# Patient Record
Sex: Female | Born: 1980 | Race: Black or African American | Hispanic: No | Marital: Single | Smoking: Former smoker
Health system: Southern US, Community
[De-identification: ages and names within clinical notes are randomized; demographics above are authoritative.]

## PROBLEM LIST (undated history)

## (undated) DIAGNOSIS — J45909 Unspecified asthma, uncomplicated: Secondary | ICD-10-CM

## (undated) DIAGNOSIS — I10 Essential (primary) hypertension: Secondary | ICD-10-CM

## (undated) DIAGNOSIS — E119 Type 2 diabetes mellitus without complications: Secondary | ICD-10-CM

## (undated) DIAGNOSIS — E669 Obesity, unspecified: Secondary | ICD-10-CM

## (undated) HISTORY — DX: Obesity, unspecified: E66.9

## (undated) HISTORY — DX: Essential (primary) hypertension: I10

## (undated) HISTORY — DX: Unspecified asthma, uncomplicated: J45.909

## (undated) HISTORY — DX: Type 2 diabetes mellitus without complications: E11.9

---
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
## 2000-07-07 ENCOUNTER — Other Ambulatory Visit: Admission: RE | Admit: 2000-07-07 | Discharge: 2000-07-07 | Payer: Self-pay | Admitting: Internal Medicine

## 2001-07-19 ENCOUNTER — Other Ambulatory Visit: Admission: RE | Admit: 2001-07-19 | Discharge: 2001-07-19 | Payer: Self-pay | Admitting: Internal Medicine

## 2002-09-27 ENCOUNTER — Encounter: Admission: RE | Admit: 2002-09-27 | Discharge: 2002-09-27 | Payer: Self-pay | Admitting: Obstetrics and Gynecology

## 2002-09-27 ENCOUNTER — Other Ambulatory Visit: Admission: RE | Admit: 2002-09-27 | Discharge: 2002-09-27 | Payer: Self-pay | Admitting: Obstetrics and Gynecology

## 2003-04-13 ENCOUNTER — Emergency Department (HOSPITAL_COMMUNITY): Admission: EM | Admit: 2003-04-13 | Discharge: 2003-04-13 | Payer: Self-pay

## 2004-07-24 ENCOUNTER — Ambulatory Visit (HOSPITAL_COMMUNITY): Admission: RE | Admit: 2004-07-24 | Discharge: 2004-07-24 | Payer: Self-pay | Admitting: *Deleted

## 2005-05-11 ENCOUNTER — Emergency Department (HOSPITAL_COMMUNITY): Admission: EM | Admit: 2005-05-11 | Discharge: 2005-05-11 | Payer: Self-pay | Admitting: Emergency Medicine

## 2005-06-04 ENCOUNTER — Emergency Department (HOSPITAL_COMMUNITY): Admission: EM | Admit: 2005-06-04 | Discharge: 2005-06-04 | Payer: Self-pay | Admitting: Family Medicine

## 2005-06-17 ENCOUNTER — Ambulatory Visit (HOSPITAL_COMMUNITY): Admission: RE | Admit: 2005-06-17 | Discharge: 2005-06-17 | Payer: Self-pay | Admitting: *Deleted

## 2005-08-26 ENCOUNTER — Ambulatory Visit (HOSPITAL_COMMUNITY): Admission: RE | Admit: 2005-08-26 | Discharge: 2005-08-26 | Payer: Self-pay | Admitting: *Deleted

## 2005-11-02 ENCOUNTER — Inpatient Hospital Stay (HOSPITAL_COMMUNITY): Admission: AD | Admit: 2005-11-02 | Discharge: 2005-11-02 | Payer: Self-pay | Admitting: Family Medicine

## 2005-11-02 ENCOUNTER — Ambulatory Visit: Payer: Self-pay | Admitting: Obstetrics and Gynecology

## 2005-12-13 ENCOUNTER — Inpatient Hospital Stay (HOSPITAL_COMMUNITY): Admission: AD | Admit: 2005-12-13 | Discharge: 2005-12-15 | Payer: Self-pay | Admitting: Obstetrics

## 2005-12-14 ENCOUNTER — Encounter (INDEPENDENT_AMBULATORY_CARE_PROVIDER_SITE_OTHER): Payer: Self-pay | Admitting: *Deleted

## 2007-03-21 ENCOUNTER — Emergency Department (HOSPITAL_COMMUNITY): Admission: EM | Admit: 2007-03-21 | Discharge: 2007-03-21 | Payer: Self-pay | Admitting: Family Medicine

## 2007-04-17 IMAGING — US US OB COMP LESS 14 WK
1 series · 18 of 28 positions shown · non-contrast
Comparison: none

CLINICAL DATA: Dating.  
 OBSTETRICAL ULTRASOUND <14 WKS AND TRANSVAGINAL OB US:
TECHNIQUE: Both transabdominal and transvaginal ultrasound examinations were performed for complete evaluation of the gestation as well as the maternal uterus, adnexal regions, and pelvic cul-de-sac.

[Series 1: us ob comp less 14 wks · 18 of 57 slices shown]
[im 1/57]
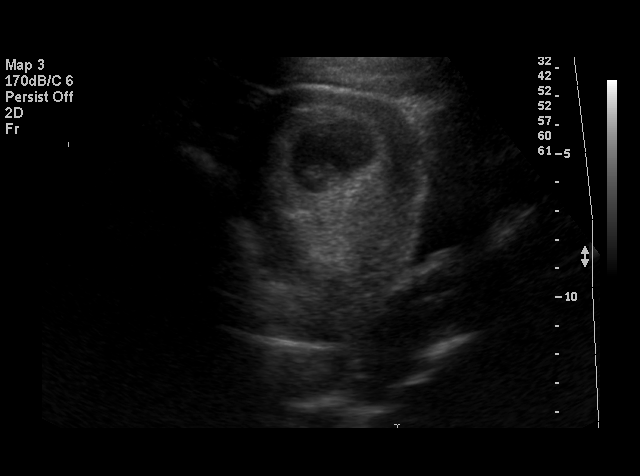
[im 5/57]
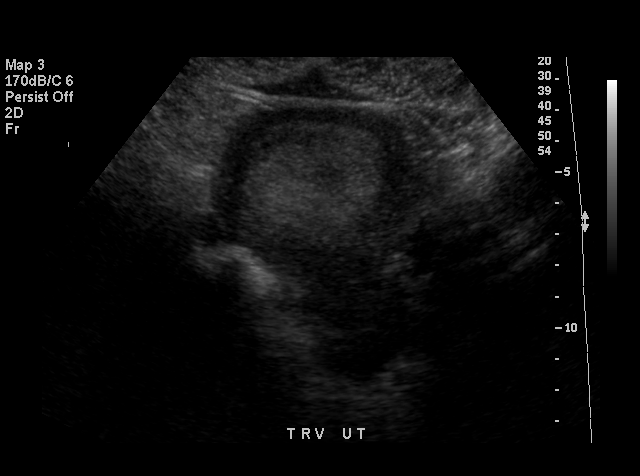
[im 7/57]
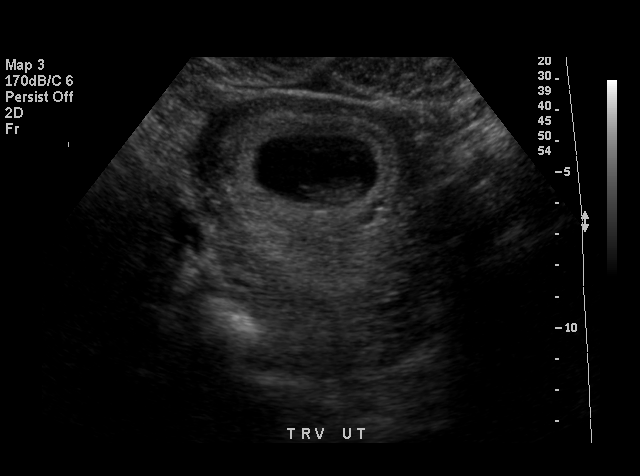
[im 11/57]
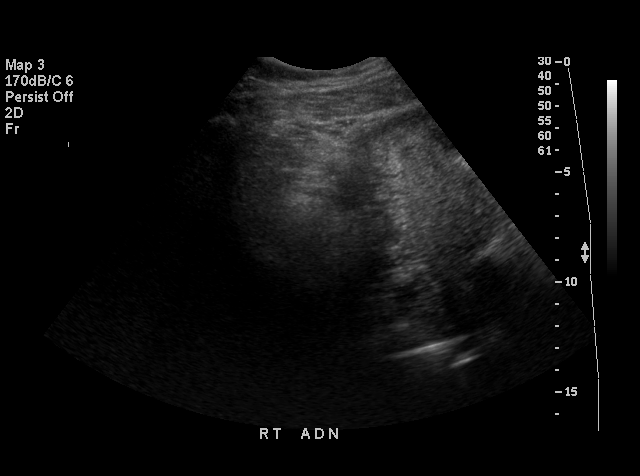
[im 15/57]
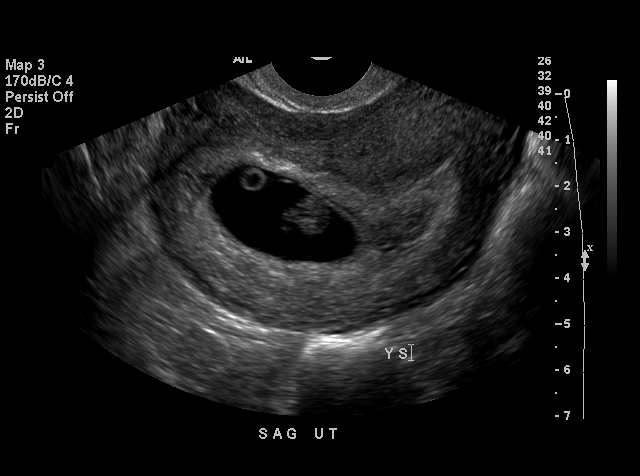
[im 17/57]
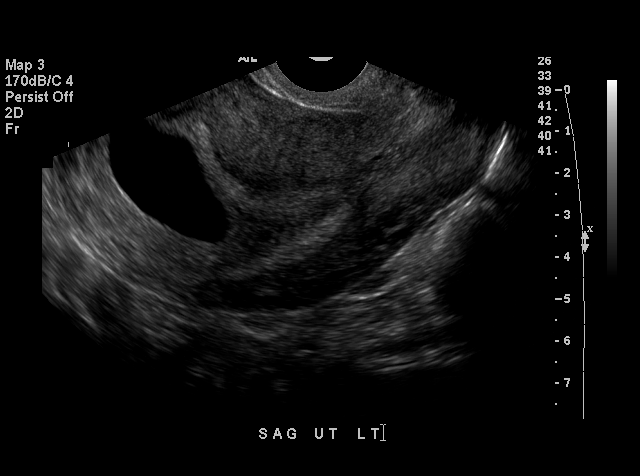
[im 21/57]
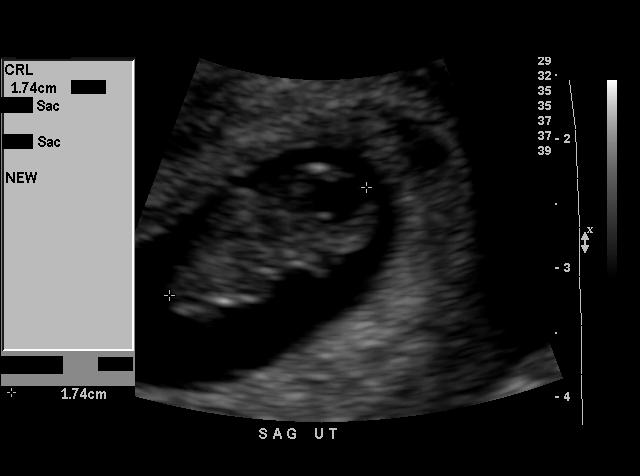
[im 23/57]
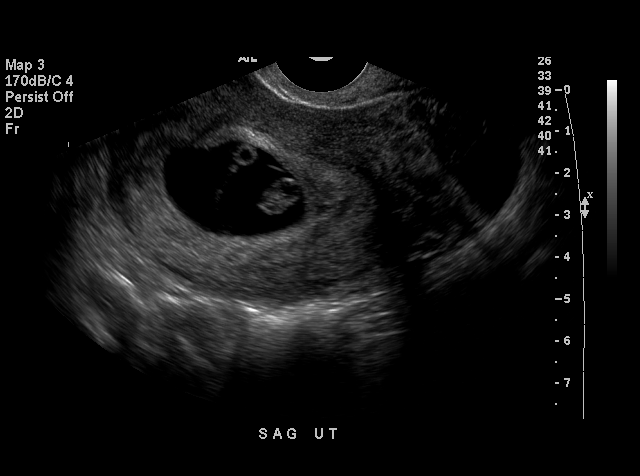
[im 27/57]
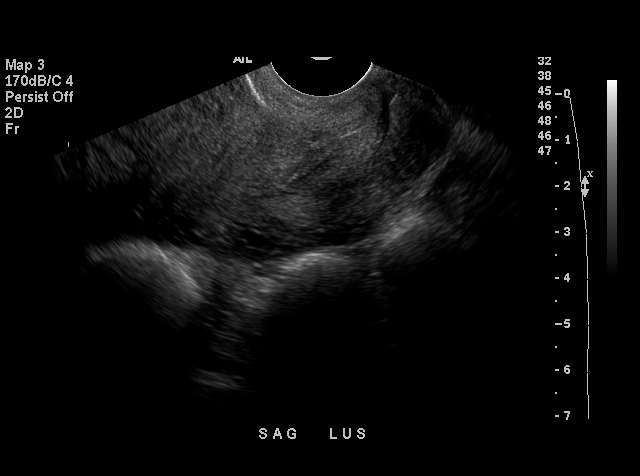
[im 30/57]
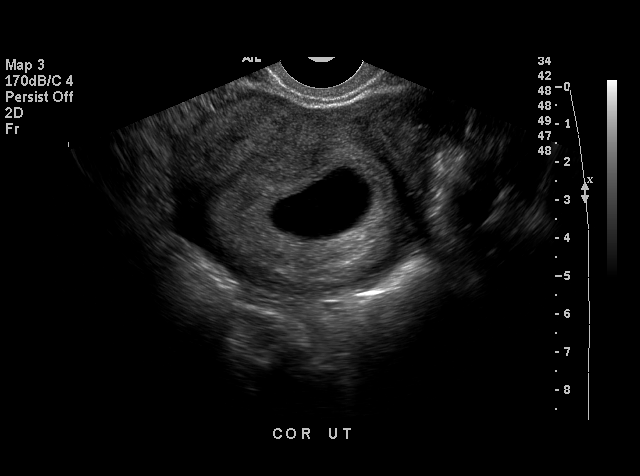
[im 34/57]
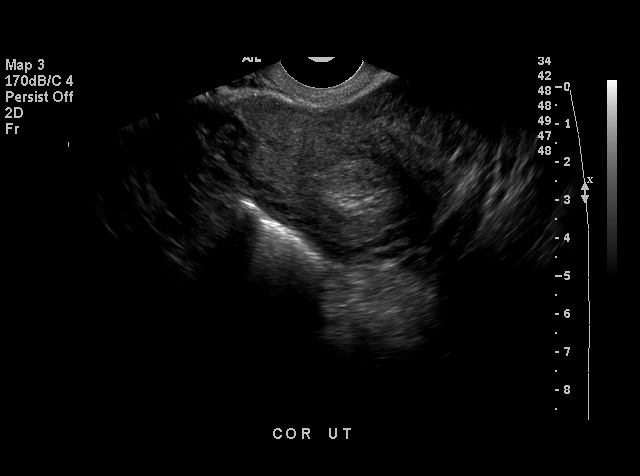
[im 36/57]
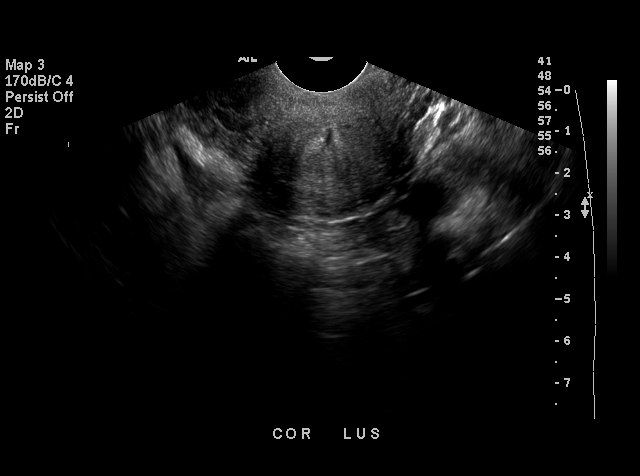
[im 40/57]
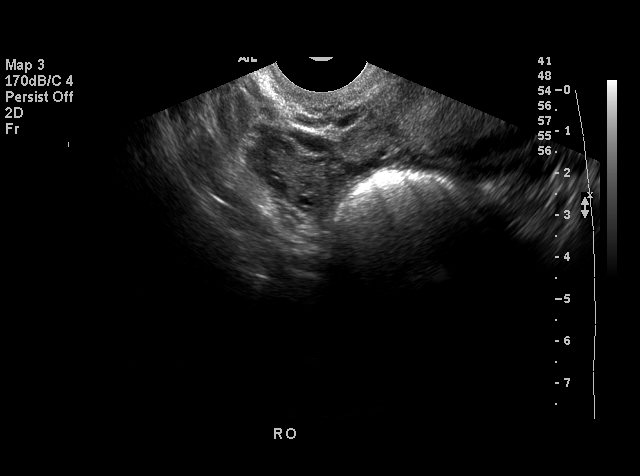
[im 44/57]
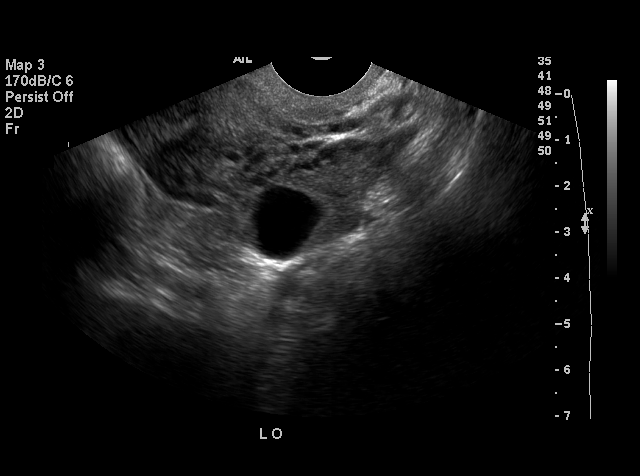
[im 46/57]
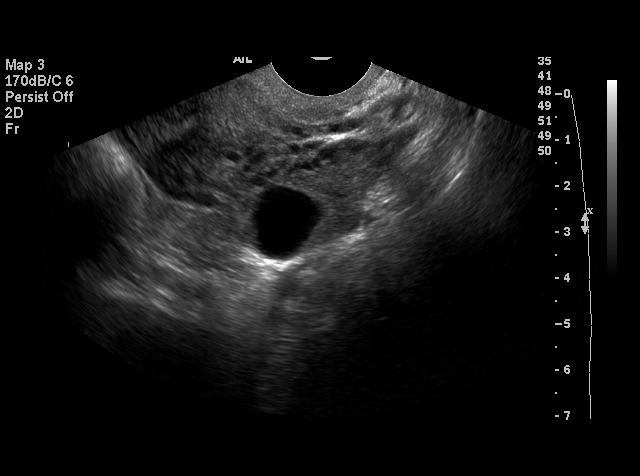
[im 50/57]
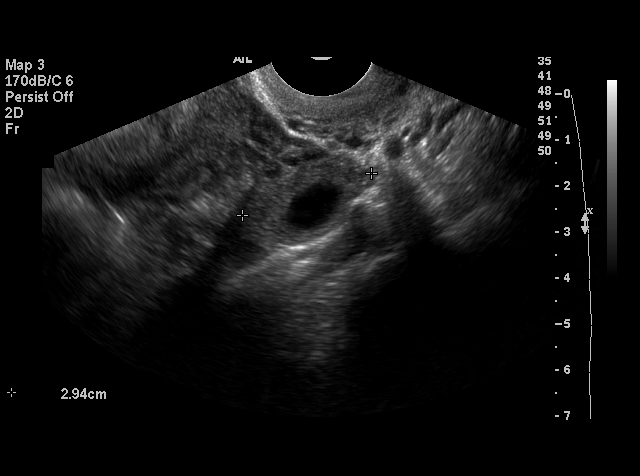
[im 52/57]
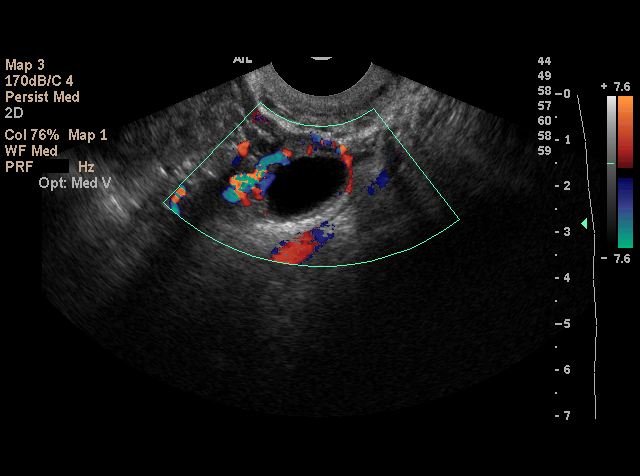
[im 57/57]
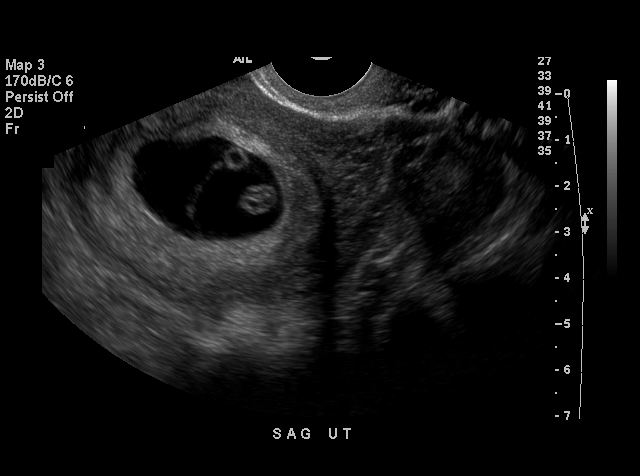

[18 of 28 positions shown; findings below may reference images not displayed]

FINDINGS: Multiple images of the uterus and adnexa were obtained using a transabdominal and endovaginal approaches.
 There is a single intrauterine pregnancy identified that demonstrates an estimated gestational age by crown rump length of 8 weeks and 2 days.  Positive regular fetal cardiac activity with a rate of 158 bpm was noted.  A normal appearing yolk sac and amnion are seen.  A very small old subchorionic hemorrhage is identified.  
 Both ovaries are seen and have a normal appearance with the right ovary measuring 3.2 x 1.3 x 2.1 cm and the left ovary measuring 3.7 x 1.5 x 2.9 cm and containing a corpus luteum cyst.  A small amount of simple free fluid is identified in the cul-de-sac.
IMPRESSION: 8 week 2 day living intrauterine pregnancy.  Normal ovaries.

## 2008-09-26 ENCOUNTER — Emergency Department (HOSPITAL_COMMUNITY): Admission: EM | Admit: 2008-09-26 | Discharge: 2008-09-26 | Payer: Self-pay | Admitting: Family Medicine

## 2009-01-04 ENCOUNTER — Emergency Department (HOSPITAL_COMMUNITY): Admission: EM | Admit: 2009-01-04 | Discharge: 2009-01-04 | Payer: Self-pay | Admitting: Emergency Medicine

## 2010-04-24 ENCOUNTER — Inpatient Hospital Stay (HOSPITAL_COMMUNITY)
Admission: AD | Admit: 2010-04-24 | Discharge: 2010-04-24 | Payer: Self-pay | Source: Home / Self Care | Attending: Obstetrics and Gynecology | Admitting: Obstetrics and Gynecology

## 2010-04-28 ENCOUNTER — Inpatient Hospital Stay (HOSPITAL_COMMUNITY)
Admission: AD | Admit: 2010-04-28 | Discharge: 2010-04-28 | Payer: Self-pay | Source: Home / Self Care | Attending: Obstetrics & Gynecology | Admitting: Obstetrics & Gynecology

## 2010-05-04 ENCOUNTER — Inpatient Hospital Stay (HOSPITAL_COMMUNITY)
Admission: AD | Admit: 2010-05-04 | Discharge: 2010-05-04 | Payer: Self-pay | Source: Home / Self Care | Attending: Obstetrics & Gynecology | Admitting: Obstetrics & Gynecology

## 2010-05-11 ENCOUNTER — Ambulatory Visit (HOSPITAL_COMMUNITY)
Admission: RE | Admit: 2010-05-11 | Discharge: 2010-05-11 | Payer: Self-pay | Source: Home / Self Care | Attending: Obstetrics & Gynecology | Admitting: Obstetrics & Gynecology

## 2010-05-18 ENCOUNTER — Inpatient Hospital Stay (HOSPITAL_COMMUNITY)
Admission: AD | Admit: 2010-05-18 | Discharge: 2010-05-18 | Payer: Self-pay | Source: Home / Self Care | Attending: Obstetrics and Gynecology | Admitting: Obstetrics and Gynecology

## 2010-05-25 ENCOUNTER — Inpatient Hospital Stay (HOSPITAL_COMMUNITY)
Admission: AD | Admit: 2010-05-25 | Discharge: 2010-05-25 | Payer: Self-pay | Source: Home / Self Care | Attending: Obstetrics and Gynecology | Admitting: Obstetrics and Gynecology

## 2010-05-25 LAB — HCG, QUANTITATIVE, PREGNANCY: hCG, Beta Chain, Quant, S: 67 m[IU]/mL — ABNORMAL HIGH (ref <5)

## 2010-05-27 LAB — HCG, QUANTITATIVE, PREGNANCY: hCG, Beta Chain, Quant, S: 10 m[IU]/mL — ABNORMAL HIGH (ref <5)

## 2010-06-01 ENCOUNTER — Ambulatory Visit (HOSPITAL_COMMUNITY)
Admission: RE | Admit: 2010-06-01 | Discharge: 2010-06-01 | Payer: Self-pay | Source: Home / Self Care | Attending: Obstetrics & Gynecology | Admitting: Obstetrics & Gynecology

## 2010-06-02 LAB — HCG, QUANTITATIVE, PREGNANCY: hCG, Beta Chain, Quant, S: 4 m[IU]/mL (ref <5)

## 2010-06-24 ENCOUNTER — Other Ambulatory Visit: Payer: Self-pay | Admitting: Obstetrics & Gynecology

## 2010-06-24 ENCOUNTER — Encounter (INDEPENDENT_AMBULATORY_CARE_PROVIDER_SITE_OTHER): Payer: Medicaid Other | Admitting: Obstetrics & Gynecology

## 2010-06-24 DIAGNOSIS — Z124 Encounter for screening for malignant neoplasm of cervix: Secondary | ICD-10-CM

## 2010-06-24 DIAGNOSIS — O00109 Unspecified tubal pregnancy without intrauterine pregnancy: Secondary | ICD-10-CM

## 2010-07-20 LAB — URINALYSIS, ROUTINE W REFLEX MICROSCOPIC
Bilirubin Urine: NEGATIVE
Glucose, UA: NEGATIVE mg/dL
Ketones, ur: 15 mg/dL — AB
Leukocytes, UA: NEGATIVE
Nitrite: NEGATIVE
Protein, ur: NEGATIVE mg/dL
Specific Gravity, Urine: 1.025 (ref 1.005–1.030)
Urobilinogen, UA: 0.2 mg/dL (ref 0.0–1.0)
pH: 5.5 (ref 5.0–8.0)

## 2010-07-20 LAB — CBC
HCT: 36.9 % (ref 36.0–46.0)
Hemoglobin: 12.9 g/dL (ref 12.0–15.0)
MCH: 27.6 pg (ref 26.0–34.0)
MCHC: 35 g/dL (ref 30.0–36.0)
MCV: 78.8 fL (ref 78.0–100.0)
Platelets: 219 10*3/uL (ref 150–400)
RBC: 4.68 MIL/uL (ref 3.87–5.11)
RDW: 13.6 % (ref 11.5–15.5)
WBC: 10 10*3/uL (ref 4.0–10.5)

## 2010-07-20 LAB — COMPREHENSIVE METABOLIC PANEL
ALT: 11 U/L (ref 0–35)
AST: 22 U/L (ref 0–37)
Albumin: 3.9 g/dL (ref 3.5–5.2)
Alkaline Phosphatase: 76 U/L (ref 39–117)
BUN: 6 mg/dL (ref 6–23)
CO2: 22 mEq/L (ref 19–32)
Calcium: 9.4 mg/dL (ref 8.4–10.5)
Chloride: 107 mEq/L (ref 96–112)
Creatinine, Ser: 0.75 mg/dL (ref 0.4–1.2)
GFR calc Af Amer: >60 mL/min (ref >60)
GFR calc non Af Amer: >60 mL/min (ref >60)
Glucose, Bld: 94 mg/dL (ref 70–99)
Potassium: 3.9 mEq/L (ref 3.5–5.1)
Sodium: 139 mEq/L (ref 135–145)
Total Bilirubin: 0.4 mg/dL (ref 0.3–1.2)
Total Protein: 6.7 g/dL (ref 6.0–8.3)

## 2010-07-20 LAB — URINE MICROSCOPIC-ADD ON

## 2010-07-20 LAB — WET PREP, GENITAL: Yeast Wet Prep HPF POC: NONE SEEN

## 2010-07-20 LAB — HCG, QUANTITATIVE, PREGNANCY
hCG, Beta Chain, Quant, S: 12438 m[IU]/mL — ABNORMAL HIGH (ref <5)
hCG, Beta Chain, Quant, S: 326 m[IU]/mL — ABNORMAL HIGH (ref <5)
hCG, Beta Chain, Quant, S: 4743 m[IU]/mL — ABNORMAL HIGH (ref <5)
hCG, Beta Chain, Quant, S: 6878 m[IU]/mL — ABNORMAL HIGH (ref <5)

## 2010-07-20 LAB — DIFFERENTIAL
Basophils Absolute: 0 10*3/uL (ref 0.0–0.1)
Basophils Relative: 0 % (ref 0–1)
Eosinophils Absolute: 0.2 10*3/uL (ref 0.0–0.7)
Eosinophils Relative: 2 % (ref 0–5)
Lymphocytes Relative: 22 % (ref 12–46)
Lymphs Abs: 2.2 10*3/uL (ref 0.7–4.0)
Monocytes Absolute: 0.6 10*3/uL (ref 0.1–1.0)
Monocytes Relative: 6 % (ref 3–12)
Neutro Abs: 7 10*3/uL (ref 1.7–7.7)
Neutrophils Relative %: 70 % (ref 43–77)

## 2010-07-20 LAB — GC/CHLAMYDIA PROBE AMP, GENITAL
Chlamydia, DNA Probe: NEGATIVE
GC Probe Amp, Genital: NEGATIVE

## 2010-07-20 LAB — ABO/RH: ABO/RH(D): B POS

## 2010-07-20 LAB — POCT PREGNANCY, URINE: Preg Test, Ur: POSITIVE

## 2010-07-31 NOTE — Progress Notes (Signed)
NAME:  Tonya Owens, Tonya Owens NO.:  [id number]  MEDICAL RECORD NO.:  [id number]           PATIENT TYPE:  A  LOCATION:  [facility]                   FACILITY:  [facility]  PHYSICIAN:  Allie Bossier, MD        DATE OF BIRTH:  01-08-1981  DATE OF SERVICE:  06/24/2010                                 CLINIC NOTE  Tonya Owens is a 30 year old single black G3, P2 1 ectopic and 1 living child (90 year old daughter).  She is a followup from an MAU visit. Please note that on January 23 she followed back up in the MAU for an ectopic that has been treated with methotrexate on April 24, 2010. Her beta on May 25, 2010, was 10 and on the 23rd it came back as 4 (less than five).  She has no complaints.  She is using condoms for birth control at this point but would be interested in a more effective/more permanent form of birth control.  She has no particular GYN complaints.  I did a Pap smear and a pelvic exam which was within normal limits.  Uterus was normal size and shape, anteverted, nontender. Adnexa nontender, no masses and her cervix appeared normal.  ASSESSMENT AND PLAN: 1. Follow up after methotrexate for ectopic - her quants are now less     than 5.  So no more follow up is needed for this. 2. No recent Pap smear.  I have checked Pap smear.  No more follow up     on that is necessary. 3. Birth control.  I have given her handout on Mirena IUD and     recommended that if she agrees with this form of birth control that     she should schedule an appointment as soon as possible to have it     placed.  In the meantime, she plans to use condoms.  Please note we did discuss all other forms of birth control but she says that Depo has caused her hair to follow out and she "faints" when she takes birth control pills.     Allie Bossier, MD    MCD/MEDQ  D:  06/24/2010  T:  06/25/2010  Job:  [id number]

## 2010-08-04 ENCOUNTER — Inpatient Hospital Stay (HOSPITAL_COMMUNITY)
Admission: AD | Admit: 2010-08-04 | Discharge: 2010-08-04 | Disposition: A | Discharge status: Home / Self Care | Payer: Medicaid Other | Source: Ambulatory Visit | Attending: Obstetrics & Gynecology | Admitting: Obstetrics & Gynecology

## 2010-08-04 DIAGNOSIS — N61 Mastitis without abscess: Secondary | ICD-10-CM | POA: Insufficient documentation

## 2010-08-04 LAB — POCT PREGNANCY, URINE: Preg Test, Ur: NEGATIVE

## 2010-08-09 ENCOUNTER — Emergency Department (HOSPITAL_COMMUNITY)
Admission: EM | Admit: 2010-08-09 | Discharge: 2010-08-09 | Disposition: A | Discharge status: Home / Self Care | Payer: No Typology Code available for payment source | Attending: Emergency Medicine | Admitting: Emergency Medicine

## 2010-08-09 ENCOUNTER — Emergency Department (HOSPITAL_COMMUNITY): Payer: No Typology Code available for payment source

## 2010-08-09 DIAGNOSIS — S5000XA Contusion of unspecified elbow, initial encounter: Secondary | ICD-10-CM | POA: Insufficient documentation

## 2010-08-09 DIAGNOSIS — D573 Sickle-cell trait: Secondary | ICD-10-CM | POA: Insufficient documentation

## 2010-08-09 DIAGNOSIS — Y92410 Unspecified street and highway as the place of occurrence of the external cause: Secondary | ICD-10-CM | POA: Insufficient documentation

## 2010-08-09 DIAGNOSIS — T148XXA Other injury of unspecified body region, initial encounter: Secondary | ICD-10-CM | POA: Insufficient documentation

## 2010-08-09 DIAGNOSIS — S139XXA Sprain of joints and ligaments of unspecified parts of neck, initial encounter: Secondary | ICD-10-CM | POA: Insufficient documentation

## 2010-08-09 DIAGNOSIS — S60219A Contusion of unspecified wrist, initial encounter: Secondary | ICD-10-CM | POA: Insufficient documentation

## 2010-08-11 ENCOUNTER — Emergency Department (HOSPITAL_COMMUNITY)
Admission: EM | Admit: 2010-08-11 | Discharge: 2010-08-11 | Disposition: A | Discharge status: Home / Self Care | Payer: No Typology Code available for payment source | Attending: Emergency Medicine | Admitting: Emergency Medicine

## 2010-08-11 DIAGNOSIS — D573 Sickle-cell trait: Secondary | ICD-10-CM | POA: Insufficient documentation

## 2010-08-11 DIAGNOSIS — M549 Dorsalgia, unspecified: Secondary | ICD-10-CM | POA: Insufficient documentation

## 2010-08-18 LAB — POCT URINALYSIS DIP (DEVICE)
Glucose, UA: NEGATIVE mg/dL
Hgb urine dipstick: NEGATIVE
Specific Gravity, Urine: 1.02 (ref 1.005–1.030)
pH: 6.5 (ref 5.0–8.0)

## 2011-01-04 ENCOUNTER — Inpatient Hospital Stay (INDEPENDENT_AMBULATORY_CARE_PROVIDER_SITE_OTHER)
Admission: RE | Admit: 2011-01-04 | Discharge: 2011-01-04 | Disposition: A | Discharge status: Home / Self Care | Payer: Self-pay | Source: Ambulatory Visit | Attending: Family Medicine | Admitting: Family Medicine

## 2011-01-04 DIAGNOSIS — S335XXA Sprain of ligaments of lumbar spine, initial encounter: Secondary | ICD-10-CM

## 2012-01-26 ENCOUNTER — Encounter (HOSPITAL_COMMUNITY): Payer: Self-pay

## 2012-01-26 ENCOUNTER — Emergency Department (HOSPITAL_COMMUNITY)
Admission: EM | Admit: 2012-01-26 | Discharge: 2012-01-26 | Disposition: A | Discharge status: Home / Self Care | Payer: Medicaid Other | Source: Home / Self Care | Attending: Emergency Medicine | Admitting: Emergency Medicine

## 2012-01-26 DIAGNOSIS — J320 Chronic maxillary sinusitis: Secondary | ICD-10-CM

## 2012-01-26 MED ORDER — ACETAMINOPHEN-CODEINE #3 300-30 MG PO TABS: 1.0000 | ORAL_TABLET | Freq: Four times a day (QID) | ORAL | Status: DC | PRN

## 2012-01-26 MED ORDER — FEXOFENADINE-PSEUDOEPHED ER 60-120 MG PO TB12: 1.0000 | ORAL_TABLET | Freq: Two times a day (BID) | ORAL | Status: DC

## 2012-01-26 NOTE — ED Notes (Signed)
C/o sinus problems, x 2 days. Facial pain and pressure, eye pain.

## 2012-01-26 NOTE — ED Provider Notes (Signed)
History     CSN: [id number]  Arrival date & time 01/26/12  1901   First MD Initiated Contact with Patient 01/26/12 1917      Chief Complaint  Patient presents with  . Facial Pain    (Consider location/radiation/quality/duration/timing/severity/associated sxs/prior treatment) HPI Comments: Patient presents to urgent care complaining of pressure and discomfort, (points to maxillary region bilaterally.), Also describes it intermittently have felt some discomfort or pain behind her eyes. But that has faded away. Denies any postnasal dripping, cough fevers sore throat or any other symptoms.   The history is provided by the patient.    History reviewed. No pertinent past medical history.  History reviewed. No pertinent past surgical history.  No family history on file.  History  Substance Use Topics  . Smoking status: Current Every Day Smoker  . Smokeless tobacco: Not on file  . Alcohol Use: Yes    OB History    Grav Para Term Preterm Abortions TAB SAB Ect Mult Living                  Review of Systems  Constitutional: Negative for fever, chills, activity change, appetite change and fatigue.  HENT: Positive for sinus pressure. Negative for hearing loss, congestion, sore throat, facial swelling, rhinorrhea, drooling, mouth sores, trouble swallowing, neck pain, neck stiffness, dental problem, voice change and postnasal drip.   Eyes: Positive for pain. Negative for photophobia, redness and visual disturbance.  Musculoskeletal: Negative for back pain.  Skin: Negative for rash and wound.  Neurological: Negative for dizziness, seizures, speech difficulty, light-headedness, numbness and headaches.    Allergies  Review of patient's allergies indicates no known allergies.  Home Medications   Current Outpatient Rx  Name Route Sig Dispense Refill  . ACETAMINOPHEN-CODEINE #3 300-30 MG PO TABS Oral Take 1-2 tablets by mouth every 6 (six) hours as needed for pain. 15 tablet 0  .  FEXOFENADINE-PSEUDOEPHED ER 60-120 MG PO TB12 Oral Take 1 tablet by mouth every 12 (twelve) hours. 30 tablet 0    BP 137/82  Pulse 91  Temp 98.4 F (36.9 C) (Oral)  Resp 18  SpO2 100%  LMP 01/21/2012  Physical Exam  Nursing note and vitals reviewed. Constitutional: Vital signs are normal. She appears well-developed and well-nourished.  Non-toxic appearance. She does not have a sickly appearance. She does not appear ill. No distress.  HENT:  Head: Normocephalic.  Eyes: Conjunctivae normal are normal.  Cardiovascular: Normal rate.  Exam reveals no gallop.   No murmur heard. Pulmonary/Chest: Effort normal and breath sounds normal. No respiratory distress. She has no wheezes. She has no rales. She exhibits no tenderness.  Skin: No rash noted. No erythema.    ED Course  Procedures (including critical care time)  Labs Reviewed - No data to display No results found.   1. Maxillary sinusitis       MDM  Patient with facial pain, reporting in her maxillary region. Afebrile, with an unremarkable exam including a normal neurological exam. Patient has been prescribed Allegra-D encouraged to hydrate yourself well and use humidifiers at night as much as possible. Have encouraged her to return if any further symptoms or worsening symptoms.        Jimmie Molly, MD 01/26/12 2049

## 2012-01-28 ENCOUNTER — Other Ambulatory Visit (HOSPITAL_COMMUNITY)
Admission: RE | Admit: 2012-01-28 | Discharge: 2012-01-28 | Disposition: A | Discharge status: Home / Self Care | Payer: Medicaid Other | Source: Ambulatory Visit | Attending: Obstetrics and Gynecology | Admitting: Obstetrics and Gynecology

## 2012-01-28 ENCOUNTER — Ambulatory Visit (INDEPENDENT_AMBULATORY_CARE_PROVIDER_SITE_OTHER): Payer: Medicaid Other | Admitting: Obstetrics and Gynecology

## 2012-01-28 ENCOUNTER — Encounter: Payer: Self-pay | Admitting: Obstetrics and Gynecology

## 2012-01-28 VITALS — BP 121/80 | HR 97 | Temp 97.5°F | Resp 12 | Ht 65.0 in | Wt 211.7 lb

## 2012-01-28 DIAGNOSIS — Z87891 Personal history of nicotine dependence: Secondary | ICD-10-CM | POA: Insufficient documentation

## 2012-01-28 DIAGNOSIS — Z3042 Encounter for surveillance of injectable contraceptive: Secondary | ICD-10-CM

## 2012-01-28 DIAGNOSIS — Z720 Tobacco use: Secondary | ICD-10-CM

## 2012-01-28 DIAGNOSIS — Z01419 Encounter for gynecological examination (general) (routine) without abnormal findings: Secondary | ICD-10-CM

## 2012-01-28 DIAGNOSIS — Z3049 Encounter for surveillance of other contraceptives: Secondary | ICD-10-CM

## 2012-01-28 DIAGNOSIS — F17200 Nicotine dependence, unspecified, uncomplicated: Secondary | ICD-10-CM

## 2012-01-28 DIAGNOSIS — Z113 Encounter for screening for infections with a predominantly sexual mode of transmission: Secondary | ICD-10-CM | POA: Insufficient documentation

## 2012-01-28 DIAGNOSIS — E669 Obesity, unspecified: Secondary | ICD-10-CM

## 2012-01-28 DIAGNOSIS — Z1151 Encounter for screening for human papillomavirus (HPV): Secondary | ICD-10-CM | POA: Insufficient documentation

## 2012-01-28 DIAGNOSIS — Z0000 Encounter for general adult medical examination without abnormal findings: Secondary | ICD-10-CM

## 2012-01-28 LAB — POCT PREGNANCY, URINE: Preg Test, Ur: NEGATIVE

## 2012-01-28 MED ORDER — MEDROXYPROGESTERONE ACETATE 150 MG/ML IM SUSP: 150.0000 mg | INTRAMUSCULAR | Status: DC

## 2012-01-28 MED ORDER — MEDROXYPROGESTERONE ACETATE 150 MG/ML IM SUSP
150.0000 mg | INTRAMUSCULAR | Status: DC
  Administered 2012-01-28: 150 mg via INTRAMUSCULAR

## 2012-01-28 NOTE — Progress Notes (Signed)
Chief Complaint: Gynecologic Exam   None    SUBJECTIVE HPI: Tonya Owens is a 31 y.o. G3 P2011 who presents today for Pap smear, malodorous white non-pruritic vaginal discharge and wanting to start on Depo-Provera. She states she did have an abnormal Pap some time in the past where they "froze cells" of her cervix. Her last Pap February 2012 was normal. She's not currently sexually active but requests GC chlamydia testing with Pap. Menses are normal and she has no abnormal bleeding. LMP 9 /13/ 2013.  History reviewed. No pertinent past medical history. OB History    Grav Para Term Preterm Abortions TAB SAB Ect Mult Living                Hx FT NSVD, term FD, ectopic  History reviewed. No pertinent past surgical history. History   Social History  . Marital Status: Single    Spouse Name: N/A    Number of Children: N/A  . Years of Education: N/A   Occupational History  . Not on file.   Social History Main Topics  . Smoking status: Current Every Day Smoker -- 0.5 packs/day    Types: Cigarettes  . Smokeless tobacco: Never Used  . Alcohol Use: No  . Drug Use: No  . Sexually Active: Not Currently    Birth Control/ Protection: None   Other Topics Concern  . Not on file   Social History Narrative  . No narrative on file   Current Outpatient Prescriptions on File Prior to Visit  Medication Sig Dispense Refill  . fexofenadine-pseudoephedrine (ALLEGRA-D) 60-120 MG per tablet Take 1 tablet by mouth every 12 (twelve) hours.  30 tablet  0  . acetaminophen-codeine (TYLENOL #3) 300-30 MG per tablet Take 1-2 tablets by mouth every 6 (six) hours as needed for pain.  15 tablet  0   No Known Allergies  ROS: Pertinent items in HPI  OBJECTIVE Blood pressure 121/80, pulse 97, temperature 97.5 F (36.4 C), temperature source Oral, resp. rate 12, height 5\' 5"  (1.651 m), weight 211 lb 11.2 oz (96.026 kg), last menstrual period 01/21/2012. GENERAL: Well-developed, well-nourished female in  no acute distress.  HEENT: Normocephalic HEART: normal rate RESP: normal effort BREASTS: large pendulous soft, symmetric, no dominant mass. No nipple discharge or retraction. No lymphadenopathy ABDOMEN: Soft, non-tender, obese EXTREMITIES: Nontender, no edema NEURO: Alert and oriented SPECULUM EXAM: NEFG, large amount creamy white discharge, no blood noted, cervix clean BIMANUAL: cervix posterior, mobile; uterus unable to outline well, no adnexal tenderness or masses   ASSESSMENT 1. Obesity   2. Tobacco use   Premenopausal GYN exam Vaginal discharge  PLAN WP, Pap, GC, CT, UPT Depo-Provera 150 mg IM q 3 months Refer to PCP Discussed weight loss, diet, exercise, smoking cessation    Danae Orleans, CNM 01/28/2012  11:33 AM

## 2012-01-28 NOTE — Patient Instructions (Signed)
Exercise to Lose Weight Exercise and a healthy diet may help you lose weight. Your doctor may suggest specific exercises. EXERCISE IDEAS AND TIPS  Choose low-cost things you enjoy doing, such as walking, bicycling, or exercising to workout videos.   Take stairs instead of the elevator.   Walk during your lunch break.   Park your car further away from work or school.   Go to a gym or an exercise class.   Start with 5 to 10 minutes of exercise each day. Build up to 30 minutes of exercise 4 to 6 days a week.   Wear shoes with good support and comfortable clothes.   Stretch before and after working out.   Work out until you breathe harder and your heart beats faster.   Drink extra water when you exercise.   Do not do so much that you hurt yourself, feel dizzy, or get very short of breath.  Exercises that burn about 150 calories:  Running 1  miles in 15 minutes.   Playing volleyball for 45 to 60 minutes.   Washing and waxing a car for 45 to 60 minutes.   Playing touch football for 45 minutes.   Walking 1  miles in 35 minutes.   Pushing a stroller 1  miles in 30 minutes.   Playing basketball for 30 minutes.   Raking leaves for 30 minutes.   Bicycling 5 miles in 30 minutes.   Walking 2 miles in 30 minutes.   Dancing for 30 minutes.   Shoveling snow for 15 minutes.   Swimming laps for 20 minutes.   Walking up stairs for 15 minutes.   Bicycling 4 miles in 15 minutes.   Gardening for 30 to 45 minutes.   Jumping rope for 15 minutes.   Washing windows or floors for 45 to 60 minutes.  Document Released: 05/29/2010 Document Revised: 04/15/2011 Document Reviewed: 05/29/2010 [facility] Patient Information 2012 [place], [place].Smoking Cessation This document explains the best ways for you to quit smoking and new treatments to help. It lists new medicines that can double or triple your chances of quitting and quitting for good. It also considers ways to avoid relapses  and concerns you may have about quitting, including weight gain. NICOTINE: A POWERFUL ADDICTION If you have tried to quit smoking, you know how hard it can be. It is hard because nicotine is a very addictive drug. For some people, it can be as addictive as heroin or cocaine. Usually, people make 2 or 3 tries, or more, before finally being able to quit. Each time you try to quit, you can learn about what helps and what hurts. Quitting takes hard work and a lot of effort, but you can quit smoking. QUITTING SMOKING IS ONE OF THE MOST IMPORTANT THINGS YOU WILL EVER DO.  You will live longer, feel better, and live better.   The impact on your body of quitting smoking is felt almost immediately:   Within 20 minutes, blood pressure decreases. Pulse returns to its normal level.   After 8 hours, carbon monoxide levels in the blood return to normal. Oxygen level increases.   After 24 hours, chance of heart attack starts to decrease. Breath, hair, and body stop smelling like smoke.   After 48 hours, damaged nerve endings begin to recover. Sense of taste and smell improve.   After 72 hours, the body is virtually free of nicotine. Bronchial tubes relax and breathing becomes easier.   After 2 to 12 weeks, lungs can  hold more air. Exercise becomes easier and circulation improves.   Quitting will reduce your risk of having a heart attack, stroke, cancer, or lung disease:   After 1 year, the risk of coronary heart disease is cut in half.   After 5 years, the risk of stroke falls to the same as a nonsmoker.   After 10 years, the risk of lung cancer is cut in half and the risk of other cancers decreases significantly.   After 15 years, the risk of coronary heart disease drops, usually to the level of a nonsmoker.   If you are pregnant, quitting smoking will improve your chances of having a healthy baby.   The people you live with, especially your children, will be healthier.   You will have extra  money to spend on things other than cigarettes.  FIVE KEYS TO QUITTING Studies have shown that these 5 steps will help you quit smoking and quit for good. You have the best chances of quitting if you use them together: 1. Get ready.  2. Get support and encouragement.  3. Learn new skills and behaviors.  4. Get medicine to reduce your nicotine addiction and use it correctly.  5. Be prepared for relapse or difficult situations. Be determined to continue trying to quit, even if you do not succeed at first.  1. GET READY  Set a quit date.   Change your environment.   Get rid of ALL cigarettes, ashtrays, matches, and lighters in your home, car, and place of work.   Do not let people smoke in your home.   Review your past attempts to quit. Think about what worked and what did not.   Once you quit, do not smoke. NOT EVEN A PUFF!  2. GET SUPPORT AND ENCOURAGEMENT Studies have shown that you have a better chance of being successful if you have help. You can get support in many ways.  Tell your family, friends, and coworkers that you are going to quit and need their support. Ask them not to smoke around you.   Talk to your caregivers (doctor, dentist, nurse, pharmacist, psychologist, and/or smoking counselor).   Get individual, group, or telephone counseling and support. The more counseling you have, the better your chances are of quitting. Programs are available at [employer] and health centers. Call your local health department for information about programs in your area.   Spiritual beliefs and practices may help some smokers quit.   Quit meters are Photographer that keep track of quit statistics, such as amount of "quit-time," cigarettes not smoked, and money saved.   Many smokers find one or more of the many self-help books available useful in helping them quit and stay off tobacco.  3. LEARN NEW SKILLS AND BEHAVIORS  Try to distract yourself  from urges to smoke. Talk to someone, go for a walk, or occupy your time with a task.   When you first try to quit, change your routine. Take a different route to work. Drink tea instead of coffee. Eat breakfast in a different place.   Do something to reduce your stress. Take a hot bath, exercise, or read a book.   Plan something enjoyable to do every day. Reward yourself for not smoking.   Explore interactive web-based programs that specialize in helping you quit.  4. GET MEDICINE AND USE IT CORRECTLY Medicines can help you stop smoking and decrease the urge to smoke. Combining medicine with the above behavioral  methods and support can quadruple your chances of successfully quitting smoking. The U.[place]. Food and Drug Administration (FDA) has approved 7 medicines to help you quit smoking. These medicines fall into 3 categories.  Nicotine replacement therapy (delivers nicotine to your body without the negative effects and risks of smoking):   Nicotine gum: Available over-the-counter.   Nicotine lozenges: Available over-the-counter.   Nicotine inhaler: Available by prescription.   Nicotine nasal spray: Available by prescription.   Nicotine skin patches (transdermal): Available by prescription and over-the-counter.   Antidepressant medicine (helps people abstain from smoking, but how this works is unknown):   Bupropion sustained-release (SR) tablets: Available by prescription.   Nicotinic receptor partial agonist (simulates the effect of nicotine in your brain):   Varenicline tartrate tablets: Available by prescription.   Ask your caregiver for advice about which medicines to use and how to use them. Carefully read the information on the package.   Everyone who is trying to quit may benefit from using a medicine. If you are pregnant or trying to become pregnant, nursing an infant, you are under age 80, or you smoke fewer than 10 cigarettes per day, talk to your caregiver before taking  any nicotine replacement medicines.   You should stop using a nicotine replacement product and call your caregiver if you experience nausea, dizziness, weakness, vomiting, fast or irregular heartbeat, mouth problems with the lozenge or gum, or redness or swelling of the skin around the patch that does not go away.   Do not use any other product containing nicotine while using a nicotine replacement product.   Talk to your caregiver before using these products if you have diabetes, heart disease, asthma, stomach ulcers, you had a recent heart attack, you have high blood pressure that is not controlled with medicine, a history of irregular heartbeat, or you have been prescribed medicine to help you quit smoking.  5. BE PREPARED FOR RELAPSE OR DIFFICULT SITUATIONS  Most relapses occur within the first 3 months after quitting. Do not be discouraged if you start smoking again. Remember, most people try several times before they finally quit.   You may have symptoms of withdrawal because your body is used to nicotine. You may crave cigarettes, be irritable, feel very hungry, cough often, get headaches, or have difficulty concentrating.   The withdrawal symptoms are only temporary. They are strongest when you first quit, but they will go away within 10 to 14 days.  Here are some difficult situations to watch for:  Alcohol. Avoid drinking alcohol. Drinking lowers your chances of successfully quitting.   Caffeine. Try to reduce the amount of caffeine you consume. It also lowers your chances of successfully quitting.   Other smokers. Being around smoking can make you want to smoke. Avoid smokers.   Weight gain. Many smokers will gain weight when they quit, usually less than 10 pounds. Eat a healthy diet and stay active. Do not let weight gain distract you from your main goal, quitting smoking. Some medicines that help you quit smoking may also help delay weight gain. You can always lose the weight gained  after you quit.   Bad mood or depression. There are a lot of ways to improve your mood other than smoking.  If you are having problems with any of these situations, talk to your caregiver. SPECIAL SITUATIONS AND CONDITIONS Studies suggest that everyone can quit smoking. Your situation or condition can give you a special reason to quit.  Pregnant women/new mothers: By quitting,  you protect your baby's health and your own.   Hospitalized patients: By quitting, you reduce health problems and help healing.   Heart attack patients: By quitting, you reduce your risk of a second heart attack.   Lung, head, and neck cancer patients: By quitting, you reduce your chance of a second cancer.   Parents of children and adolescents: By quitting, you protect your children from illnesses caused by secondhand smoke.  QUESTIONS TO THINK ABOUT Think about the following questions before you try to stop smoking. You may want to talk about your answers with your caregiver.  Why do you want to quit?   If you tried to quit in the past, what helped and what did not?   What will be the most difficult situations for you after you quit? How will you plan to handle them?   Who can help you through the tough times? Your family? Friends? Caregiver?   What pleasures do you get from smoking? What ways can you still get pleasure if you quit?  Here are some questions to ask your caregiver:  How can you help me to be successful at quitting?   What medicine do you think would be best for me and how should I take it?   What should I do if I need more help?   What is smoking withdrawal like? How can I get information on withdrawal?  Quitting takes hard work and a lot of effort, but you can quit smoking. FOR MORE INFORMATION  Smokefree.gov (http://www.davis-sullivan.com/) provides free, accurate, evidence-based information and professional assistance to help support the immediate and long-term needs of people trying to quit  smoking. Document Released: 04/20/2001 Document Revised: 04/15/2011 Document Reviewed: 02/10/2009 [facility] Patient Information 2012 [place], [place].

## 2012-01-29 LAB — WET PREP, GENITAL
Clue Cells Wet Prep HPF POC: NONE SEEN
Trich, Wet Prep: NONE SEEN

## 2012-02-07 ENCOUNTER — Telehealth: Payer: Self-pay | Admitting: Obstetrics and Gynecology

## 2012-02-07 NOTE — Telephone Encounter (Signed)
Patient called for test results. Pls call back.

## 2012-02-08 NOTE — Telephone Encounter (Signed)
LM for patient to return call to clinic.

## 2012-02-08 NOTE — Telephone Encounter (Signed)
Patient called back and was informed of normal test results. Patient voiced understanding and did not have any further questions at this time.

## 2012-02-08 NOTE — Telephone Encounter (Signed)
Chart reviewed. Results from 01/28/12 all normal (pap smear, GC/CT, HPV and wet prep)

## 2012-02-09 ENCOUNTER — Telehealth: Payer: Self-pay | Admitting: Obstetrics and Gynecology

## 2012-02-09 NOTE — Telephone Encounter (Signed)
Patient called to follow-up on referral to PCP. Called family practice and stated that they tried to get a hold of this patient but couldn't so they just mailed her information. Asked if they could call patient now since patient has been calling us about the referral; family practice states they will call patient now and make an appointment.

## 2012-02-14 ENCOUNTER — Encounter: Payer: Self-pay | Admitting: Family Medicine

## 2012-02-14 ENCOUNTER — Ambulatory Visit (INDEPENDENT_AMBULATORY_CARE_PROVIDER_SITE_OTHER): Payer: Medicaid Other | Admitting: Family Medicine

## 2012-02-14 VITALS — BP 138/85 | HR 99 | Temp 98.5°F | Ht 65.5 in | Wt 212.0 lb

## 2012-02-14 DIAGNOSIS — E669 Obesity, unspecified: Secondary | ICD-10-CM

## 2012-02-14 DIAGNOSIS — Z23 Encounter for immunization: Secondary | ICD-10-CM

## 2012-02-14 NOTE — Patient Instructions (Signed)
Smoking Cessation Quitting smoking is important to your health and has many advantages. However, it is not always easy to quit since nicotine is a very addictive drug. Often times, people try 3 times or more before being able to quit. This document explains the best ways for you to prepare to quit smoking. Quitting takes hard work and a lot of effort, but you can do it. ADVANTAGES OF QUITTING SMOKING  You will live longer, feel better, and live better.  Your body will feel the impact of quitting smoking almost immediately.  Within 20 minutes, blood pressure decreases. Your pulse returns to its normal level.  After 8 hours, carbon monoxide levels in the blood return to normal. Your oxygen level increases.  After 24 hours, the chance of having a heart attack starts to decrease. Your breath, hair, and body stop smelling like smoke.  After 48 hours, damaged nerve endings begin to recover. Your sense of taste and smell improve.  After 72 hours, the body is virtually free of nicotine. Your bronchial tubes relax and breathing becomes easier.  After 2 to 12 weeks, lungs can hold more air. Exercise becomes easier and circulation improves.  The risk of having a heart attack, stroke, cancer, or lung disease is greatly reduced.  After 1 year, the risk of coronary heart disease is cut in half.  After 5 years, the risk of stroke falls to the same as a nonsmoker.  After 10 years, the risk of lung cancer is cut in half and the risk of other cancers decreases significantly.  After 15 years, the risk of coronary heart disease drops, usually to the level of a nonsmoker.  If you are pregnant, quitting smoking will improve your chances of having a healthy baby.  The people you live with, especially any children, will be healthier.  You will have extra money to spend on things other than cigarettes. QUESTIONS TO THINK ABOUT BEFORE ATTEMPTING TO QUIT You may want to talk about your answers with your  caregiver.  Why do you want to quit?  If you tried to quit in the past, what helped and what did not?  What will be the most difficult situations for you after you quit? How will you plan to handle them?  Who can help you through the tough times? Your family? Friends? A caregiver?  What pleasures do you get from smoking? What ways can you still get pleasure if you quit? Here are some questions to ask your caregiver:  How can you help me to be successful at quitting?  What medicine do you think would be best for me and how should I take it?  What should I do if I need more help?  What is smoking withdrawal like? How can I get information on withdrawal? GET READY  Set a quit date.  Change your environment by getting rid of all cigarettes, ashtrays, matches, and lighters in your home, car, or work. Do not let people smoke in your home.  Review your past attempts to quit. Think about what worked and what did not. GET SUPPORT AND ENCOURAGEMENT You have a better chance of being successful if you have help. You can get support in many ways.  Tell your family, friends, and co-workers that you are going to quit and need their support. Ask them not to smoke around you.  Get individual, group, or telephone counseling and support. Programs are available at local hospitals and health centers. Call your local health department for   information about programs in your area.  Spiritual beliefs and practices may help some smokers quit.  Download a "quit meter" on your computer to keep track of quit statistics, such as how long you have gone without smoking, cigarettes not smoked, and money saved.  Get a self-help book about quitting smoking and staying off of tobacco. LEARN NEW SKILLS AND BEHAVIORS  Distract yourself from urges to smoke. Talk to someone, go for a walk, or occupy your time with a task.  Change your normal routine. Take a different route to work. Drink tea instead of coffee.  Eat breakfast in a different place.  Reduce your stress. Take a hot bath, exercise, or read a book.  Plan something enjoyable to do every day. Reward yourself for not smoking.  Explore interactive web-based programs that specialize in helping you quit. GET MEDICINE AND USE IT CORRECTLY Medicines can help you stop smoking and decrease the urge to smoke. Combining medicine with the above behavioral methods and support can greatly increase your chances of successfully quitting smoking.  Nicotine replacement therapy helps deliver nicotine to your body without the negative effects and risks of smoking. Nicotine replacement therapy includes nicotine gum, lozenges, inhalers, nasal sprays, and skin patches. Some may be available over-the-counter and others require a prescription.  Antidepressant medicine helps people abstain from smoking, but how this works is unknown. This medicine is available by prescription.  Nicotinic receptor partial agonist medicine simulates the effect of nicotine in your brain. This medicine is available by prescription. Ask your caregiver for advice about which medicines to use and how to use them based on your health history. Your caregiver will tell you what side effects to look out for if you choose to be on a medicine or therapy. Carefully read the information on the package. Do not use any other product containing nicotine while using a nicotine replacement product.  RELAPSE OR DIFFICULT SITUATIONS Most relapses occur within the first 3 months after quitting. Do not be discouraged if you start smoking again. Remember, most people try several times before finally quitting. You may have symptoms of withdrawal because your body is used to nicotine. You may crave cigarettes, be irritable, feel very hungry, cough often, get headaches, or have difficulty concentrating. The withdrawal symptoms are only temporary. They are strongest when you first quit, but they will go away within  10 14 days. To reduce the chances of relapse, try to:  Avoid drinking alcohol. Drinking lowers your chances of successfully quitting.  Reduce the amount of caffeine you consume. Once you quit smoking, the amount of caffeine in your body increases and can give you symptoms, such as a rapid heartbeat, sweating, and anxiety.  Avoid smokers because they can make you want to smoke.  Do not let weight gain distract you. Many smokers will gain weight when they quit, usually less than 10 pounds. Eat a healthy diet and stay active. You can always lose the weight gained after you quit.  Find ways to improve your mood other than smoking. FOR MORE INFORMATION  www.smokefree.gov  Document Released: 04/20/2001 Document Revised: 10/26/2011 Document Reviewed: 08/05/2011 [facility] Patient Information 2013 [place], [place]. Preventive Care for Adults, Female A healthy lifestyle and preventive care can promote health and wellness. Preventive health guidelines for women include the following key practices.  A routine yearly physical is a good way to check with your caregiver about your health and preventive screening. It is a chance to share any concerns and updates on your  health, and to receive a thorough exam.  Visit your dentist for a routine exam and preventive care every 6 months. Brush your teeth twice a day and floss once a day. Good oral hygiene prevents tooth decay and gum disease.  The frequency of eye exams is based on your age, health, family medical history, use of contact lenses, and other factors. Follow your caregiver's recommendations for frequency of eye exams.  Eat a healthy diet. Foods like vegetables, fruits, whole grains, low-fat dairy products, and lean protein foods contain the nutrients you need without too many calories. Decrease your intake of foods high in solid fats, added sugars, and salt. Eat the right amount of calories for you.Get information about a proper diet from your  caregiver, if necessary.  Regular physical exercise is one of the most important things you can do for your health. Most adults should get at least 150 minutes of moderate-intensity exercise (any activity that increases your heart rate and causes you to sweat) each week. In addition, most adults need muscle-strengthening exercises on 2 or more days a week.  Maintain a healthy weight. The body mass index (BMI) is a screening tool to identify possible weight problems. It provides an estimate of body fat based on height and weight. Your caregiver can help determine your BMI, and can help you achieve or maintain a healthy weight.For adults 20 years and older:  A BMI below 18.5 is considered underweight.  A BMI of 18.5 to 24.9 is normal.  A BMI of 25 to 29.9 is considered overweight.  A BMI of 30 and above is considered obese.  Maintain normal blood lipids and cholesterol levels by exercising and minimizing your intake of saturated fat. Eat a balanced diet with plenty of fruit and vegetables. Blood tests for lipids and cholesterol should begin at age 42 and be repeated every 5 years. If your lipid or cholesterol levels are high, you are over 50, or you are at high risk for heart disease, you may need your cholesterol levels checked more frequently.Ongoing high lipid and cholesterol levels should be treated with medicines if diet and exercise are not effective.  If you smoke, find out from your caregiver how to quit. If you do not use tobacco, do not start.  If you are pregnant, do not drink alcohol. If you are breastfeeding, be very cautious about drinking alcohol. If you are not pregnant and choose to drink alcohol, do not exceed 1 drink per day. One drink is considered to be 12 ounces (355 mL) of beer, 5 ounces (148 mL) of wine, or 1.5 ounces (44 mL) of liquor.  Avoid use of street drugs. Do not share needles with anyone. Ask for help if you need support or instructions about stopping the use of  drugs.  High blood pressure causes heart disease and increases the risk of stroke. Your blood pressure should be checked at least every 1 to 2 years. Ongoing high blood pressure should be treated with medicines if weight loss and exercise are not effective.  If you are 7 to 31 years old, ask your caregiver if you should take aspirin to prevent strokes.  Diabetes screening involves taking a blood sample to check your fasting blood sugar level. This should be done once every 3 years, after age 70, if you are within normal weight and without risk factors for diabetes. Testing should be considered at a younger age or be carried out more frequently if you are overweight and have at  least 1 risk factor for diabetes.  Breast cancer screening is essential preventive care for women. You should practice "breast self-awareness." This means understanding the normal appearance and feel of your breasts and may include breast self-examination. Any changes detected, no matter how small, should be reported to a caregiver. Women in their 6s and 30s should have a clinical breast exam (CBE) by a caregiver as part of a regular health exam every 1 to 3 years. After age 13, women should have a CBE every year. Starting at age 63, women should consider having a mammography (breast X-ray test) every year. Women who have a family history of breast cancer should talk to their caregiver about genetic screening. Women at a high risk of breast cancer should talk to their caregivers about having magnetic resonance imaging (MRI) and a mammography every year.  The Pap test is a screening test for cervical cancer. A Pap test can show cell changes on the cervix that might become cervical cancer if left untreated. A Pap test is a procedure in which cells are obtained and examined from the lower end of the uterus (cervix).  Women should have a Pap test starting at age 84.  Between ages 5 and 79, Pap tests should be repeated every 2  years.  Beginning at age 57, you should have a Pap test every 3 years as long as the past 3 Pap tests have been normal.  Some women have medical problems that increase the chance of getting cervical cancer. Talk to your caregiver about these problems. It is especially important to talk to your caregiver if a new problem develops soon after your last Pap test. In these cases, your caregiver may recommend more frequent screening and Pap tests.  The above recommendations are the same for women who have or have not gotten the vaccine for human papillomavirus (HPV).  If you had a hysterectomy for a problem that was not cancer or a condition that could lead to cancer, then you no longer need Pap tests. Even if you no longer need a Pap test, a regular exam is a good idea to make sure no other problems are starting.  If you are between ages 89 and 66, and you have had normal Pap tests going back 10 years, you no longer need Pap tests. Even if you no longer need a Pap test, a regular exam is a good idea to make sure no other problems are starting.  If you have had past treatment for cervical cancer or a condition that could lead to cancer, you need Pap tests and screening for cancer for at least 20 years after your treatment.  If Pap tests have been discontinued, risk factors (such as a new sexual partner) need to be reassessed to determine if screening should be resumed.  The HPV test is an additional test that may be used for cervical cancer screening. The HPV test looks for the virus that can cause the cell changes on the cervix. The cells collected during the Pap test can be tested for HPV. The HPV test could be used to screen women aged 74 years and older, and should be used in women of any age who have unclear Pap test results. After the age of 70, women should have HPV testing at the same frequency as a Pap test.  Colorectal cancer can be detected and often prevented. Most routine colorectal cancer  screening begins at the age of 68 and continues through age 36. However,  your caregiver may recommend screening at an earlier age if you have risk factors for colon cancer. On a yearly basis, your caregiver may provide home test kits to check for hidden blood in the stool. Use of a small camera at the end of a tube, to directly examine the colon (sigmoidoscopy or colonoscopy), can detect the earliest forms of colorectal cancer. Talk to your caregiver about this at age 38, when routine screening begins. Direct examination of the colon should be repeated every 5 to 10 years through age 85, unless early forms of pre-cancerous polyps or small growths are found.  Hepatitis C blood testing is recommended for all people born from [birth year] through 1965 and any individual with known risks for hepatitis C.  Practice safe sex. Use condoms and avoid high-risk sexual practices to reduce the spread of sexually transmitted infections (STIs). STIs include gonorrhea, chlamydia, syphilis, trichomonas, herpes, HPV, and human immunodeficiency virus (HIV). Herpes, HIV, and HPV are viral illnesses that have no cure. They can result in disability, cancer, and death. Sexually active women aged 60 and younger should be checked for chlamydia. Older women with new or multiple partners should also be tested for chlamydia. Testing for other STIs is recommended if you are sexually active and at increased risk.  Osteoporosis is a disease in which the bones lose minerals and strength with aging. This can result in serious bone fractures. The risk of osteoporosis can be identified using a bone density scan. Women ages 35 and over and women at risk for fractures or osteoporosis should discuss screening with their caregivers. Ask your caregiver whether you should take a calcium supplement or vitamin D to reduce the rate of osteoporosis.  Menopause can be associated with physical symptoms and risks. Hormone replacement therapy is available to  decrease symptoms and risks. You should talk to your caregiver about whether hormone replacement therapy is right for you.  Use sunscreen with sun protection factor (SPF) of 30 or more. Apply sunscreen liberally and repeatedly throughout the day. You should seek shade when your shadow is shorter than you. Protect yourself by wearing long sleeves, pants, a wide-brimmed hat, and sunglasses year round, whenever you are outdoors.  Once a month, do a whole body skin exam, using a mirror to look at the skin on your back. Notify your caregiver of new moles, moles that have irregular borders, moles that are larger than a pencil eraser, or moles that have changed in shape or color.  Stay current with required immunizations.  Influenza. You need a dose every fall (or winter). The composition of the flu vaccine changes each year, so being vaccinated once is not enough.  Pneumococcal polysaccharide. You need 1 to 2 doses if you smoke cigarettes or if you have certain chronic medical conditions. You need 1 dose at age 93 (or older) if you have never been vaccinated.  Tetanus, diphtheria, pertussis (Tdap, Td). Get 1 dose of Tdap vaccine if you are younger than age 22, are over 25 and have contact with an infant, are a Research scientist (physical sciences), are pregnant, or simply want to be protected from whooping cough. After that, you need a Td booster dose every 10 years. Consult your caregiver if you have not had at least 3 tetanus and diphtheria-containing shots sometime in your life or have a deep or dirty wound.  HPV. You need this vaccine if you are a woman age 62 or younger. The vaccine is given in 3 doses over 6 months.  Measles,  mumps, rubella (MMR). You need at least 1 dose of MMR if you were born in 1957 or later. You may also need a second dose.  Meningococcal. If you are age 51 to 32 and a first-year college student living in a residence hall, or have one of several medical conditions, you need to get vaccinated  against meningococcal disease. You may also need additional booster doses.  Zoster (shingles). If you are age 76 or older, you should get this vaccine.  Varicella (chickenpox). If you have never had chickenpox or you were vaccinated but received only 1 dose, talk to your caregiver to find out if you need this vaccine.  Hepatitis A. You need this vaccine if you have a specific risk factor for hepatitis A virus infection or you simply wish to be protected from this disease. The vaccine is usually given as 2 doses, 6 to 18 months apart.  Hepatitis B. You need this vaccine if you have a specific risk factor for hepatitis B virus infection or you simply wish to be protected from this disease. The vaccine is given in 3 doses, usually over 6 months. Preventive Services / Frequency Ages 41 to 55  Blood pressure check.** / Every 1 to 2 years.  Lipid and cholesterol check.** / Every 5 years beginning at age 57.  Clinical breast exam.** / Every 3 years for women in their 45s and 30s.  Pap test.** / Every 2 years from ages 80 through 55. Every 3 years starting at age 49 through age 16 or 59 with a history of 3 consecutive normal Pap tests.  HPV screening.** / Every 3 years from ages 64 through ages 13 to 34 with a history of 3 consecutive normal Pap tests.  Hepatitis C blood test.** / For any individual with known risks for hepatitis C.  Skin self-exam. / Monthly.  Influenza immunization.** / Every year.  Pneumococcal polysaccharide immunization.** / 1 to 2 doses if you smoke cigarettes or if you have certain chronic medical conditions.  Tetanus, diphtheria, pertussis (Tdap, Td) immunization. / A one-time dose of Tdap vaccine. After that, you need a Td booster dose every 10 years.  HPV immunization. / 3 doses over 6 months, if you are 27 and younger.  Measles, mumps, rubella (MMR) immunization. / You need at least 1 dose of MMR if you were born in 1957 or later. You may also need a second  dose.  Meningococcal immunization. / 1 dose if you are age 24 to 83 and a first-year college student living in a residence hall, or have one of several medical conditions, you need to get vaccinated against meningococcal disease. You may also need additional booster doses.  Varicella immunization.** / Consult your caregiver.  Hepatitis A immunization.** / Consult your caregiver. 2 doses, 6 to 18 months apart.  Hepatitis B immunization.** / Consult your caregiver. 3 doses usually over 6 months. Ages 2 to 84  Blood pressure check.** / Every 1 to 2 years.  Lipid and cholesterol check.** / Every 5 years beginning at age 51.  Clinical breast exam.** / Every year after age 74.  Mammogram.** / Every year beginning at age 22 and continuing for as long as you are in good health. Consult with your caregiver.  Pap test.** / Every 3 years starting at age 81 through age 21 or 3 with a history of 3 consecutive normal Pap tests.  HPV screening.** / Every 3 years from ages 50 through ages 40 to 61 with a  history of 3 consecutive normal Pap tests.  Fecal occult blood test (FOBT) of stool. / Every year beginning at age 100 and continuing until age 17. You may not need to do this test if you get a colonoscopy every 10 years.  Flexible sigmoidoscopy or colonoscopy.** / Every 5 years for a flexible sigmoidoscopy or every 10 years for a colonoscopy beginning at age 53 and continuing until age 29.  Hepatitis C blood test.** / For all people born from [birth year] through 1965 and any individual with known risks for hepatitis C.  Skin self-exam. / Monthly.  Influenza immunization.** / Every year.  Pneumococcal polysaccharide immunization.** / 1 to 2 doses if you smoke cigarettes or if you have certain chronic medical conditions.  Tetanus, diphtheria, pertussis (Tdap, Td) immunization.** / A one-time dose of Tdap vaccine. After that, you need a Td booster dose every 10 years.  Measles, mumps, rubella (MMR)  immunization. / You need at least 1 dose of MMR if you were born in 1957 or later. You may also need a second dose.  Varicella immunization.** / Consult your caregiver.  Meningococcal immunization.** / Consult your caregiver.  Hepatitis A immunization.** / Consult your caregiver. 2 doses, 6 to 18 months apart.  Hepatitis B immunization.** / Consult your caregiver. 3 doses, usually over 6 months. Ages 74 and over  Blood pressure check.** / Every 1 to 2 years.  Lipid and cholesterol check.** / Every 5 years beginning at age 62.  Clinical breast exam.** / Every year after age 45.  Mammogram.** / Every year beginning at age 64 and continuing for as long as you are in good health. Consult with your caregiver.  Pap test.** / Every 3 years starting at age 18 through age 56 or 102 with a 3 consecutive normal Pap tests. Testing can be stopped between 65 and 70 with 3 consecutive normal Pap tests and no abnormal Pap or HPV tests in the past 10 years.  HPV screening.** / Every 3 years from ages 73 through ages 41 or 22 with a history of 3 consecutive normal Pap tests. Testing can be stopped between 65 and 70 with 3 consecutive normal Pap tests and no abnormal Pap or HPV tests in the past 10 years.  Fecal occult blood test (FOBT) of stool. / Every year beginning at age 21 and continuing until age 74. You may not need to do this test if you get a colonoscopy every 10 years.  Flexible sigmoidoscopy or colonoscopy.** / Every 5 years for a flexible sigmoidoscopy or every 10 years for a colonoscopy beginning at age 56 and continuing until age 44.  Hepatitis C blood test.** / For all people born from [birth year] through 1965 and any individual with known risks for hepatitis C.  Osteoporosis screening.** / A one-time screening for women ages 73 and over and women at risk for fractures or osteoporosis.  Skin self-exam. / Monthly.  Influenza immunization.** / Every year.  Pneumococcal polysaccharide  immunization.** / 1 dose at age 34 (or older) if you have never been vaccinated.  Tetanus, diphtheria, pertussis (Tdap, Td) immunization. / A one-time dose of Tdap vaccine if you are over 65 and have contact with an infant, are a Research scientist (physical sciences), or simply want to be protected from whooping cough. After that, you need a Td booster dose every 10 years.  Varicella immunization.** / Consult your caregiver.  Meningococcal immunization.** / Consult your caregiver.  Hepatitis A immunization.** / Consult your caregiver. 2 doses, 6 to  18 months apart.  Hepatitis B immunization.** / Check with your caregiver. 3 doses, usually over 6 months. ** Family history and personal history of risk and conditions may change your caregiver's recommendations. Document Released: 06/22/2001 Document Revised: 07/19/2011 Document Reviewed: 09/21/2010 [facility] Patient Information 2013 [place], [place].

## 2012-02-14 NOTE — Progress Notes (Signed)
  Subjective:    Patient ID: Tonya Owens, female    DOB: 1980-09-24, 31 y.o.   MRN: [id number]  HPI Here today as a new pt.  No complaints.  Recent GYN exam with breast exam and pap smear.  Would like Diabetes testing for strong family history.  Recently ate, so cannot do lipid today.  Declines flu shot.  Desires Tdap.  History reviewed. No pertinent past medical history. History reviewed. No pertinent past surgical history. Family History  Problem Relation Age of Onset  . Hypertension Mother   . Diabetes Father   . Diabetes Maternal Grandmother    History   Social History  . Marital Status: Single    Spouse Name: N/A    Number of Children: N/A  . Years of Education: N/A   Occupational History  . Not on file.   Social History Main Topics  . Smoking status: Current Every Day Smoker -- 0.5 packs/day    Types: Cigarettes  . Smokeless tobacco: Never Used  . Alcohol Use: No  . Drug Use: No  . Sexually Active: Not Currently    Birth Control/ Protection: None   Other Topics Concern  . Not on file   Social History Narrative  . No narrative on file   Current Outpatient Prescriptions on File Prior to Visit  Medication Sig Dispense Refill  . medroxyPROGESTERone (DEPO-PROVERA) 150 MG/ML injection Inject 1 mL (150 mg total) into the muscle every 3 (three) months.  1 mL  3   No Known Allergies    Review of Systems  Constitutional: Negative for fever and chills.  HENT: Negative for congestion and sore throat.   Eyes: Negative for visual disturbance.  Respiratory: Negative for cough, chest tightness and shortness of breath.   Cardiovascular: Negative for chest pain and leg swelling.  Gastrointestinal: Negative for nausea, vomiting, abdominal pain, diarrhea and constipation.  Genitourinary: Negative for dysuria, hematuria and menstrual problem.  Musculoskeletal: Negative for back pain.  Skin: Negative for rash.  Neurological: Positive for headaches (with recent sinus  infection, but now resolved.). Negative for weakness.       Objective:   Physical Exam  Vitals reviewed. Constitutional: She is oriented to person, place, and time. She appears well-developed and well-nourished. No distress.  HENT:  Head: Normocephalic and atraumatic.  Eyes: No scleral icterus.  Neck: Neck supple. No thyromegaly present.  Cardiovascular: Normal rate and regular rhythm.   No murmur heard. Pulmonary/Chest: Effort normal and breath sounds normal. She has no wheezes.  Abdominal: Soft. She exhibits no mass. There is no tenderness.  Musculoskeletal: Normal range of motion.  Neurological: She is alert and oriented to person, place, and time.  Skin: Skin is warm and dry.  Psychiatric: She has a normal mood and affect.          Assessment & Plan:   1. Obesity  Comprehensive metabolic panel   Smoking cessation reviewed. Tdap F/u for lipid panel.

## 2012-02-15 ENCOUNTER — Encounter: Payer: Self-pay | Admitting: *Deleted

## 2012-02-15 LAB — COMPREHENSIVE METABOLIC PANEL
AST: 13 U/L (ref 0–37)
Albumin: 4.4 g/dL (ref 3.5–5.2)
Alkaline Phosphatase: 89 U/L (ref 39–117)
Potassium: 3.9 mEq/L (ref 3.5–5.3)
Sodium: 139 mEq/L (ref 135–145)
Total Bilirubin: 0.5 mg/dL (ref 0.3–1.2)
Total Protein: 7.3 g/dL (ref 6.0–8.3)

## 2012-02-22 IMAGING — US US OB COMP LESS 14 WK
1 series · 13 of 28 positions shown · non-contrast
Comparison: None.

CLINICAL DATA: Urine pregnancy test positive.  LMP 02/08/2010.
Estimated gestational age by LMP is 10 weeks 5 days.  The patient
complains of cramping and pain.

OBSTETRIC <14 WK US AND TRANSVAGINAL OB US
TECHNIQUE: Both transabdominal and transvaginal ultrasound
examinations were performed for complete evaluation of the
gestation as well as the maternal uterus, adnexal regions, and
pelvic cul-de-sac.  Transvaginal technique was performed to assess
early pregnancy.

[Series 1: us ob comp less 14 wks · 58 acquisitions, 13 frames shown]
[im 3/58]
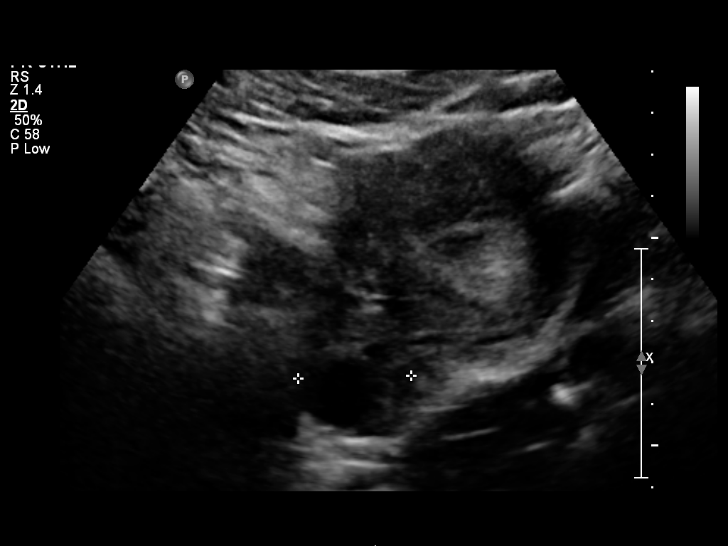
[im 7/58]
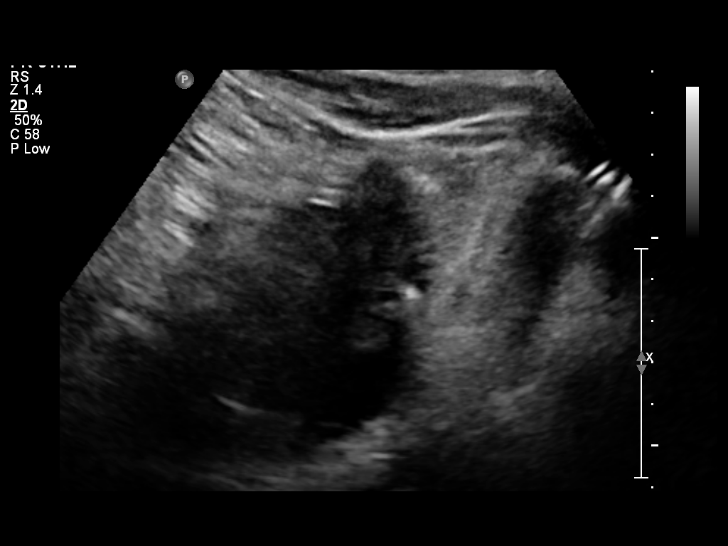
[im 11/58]
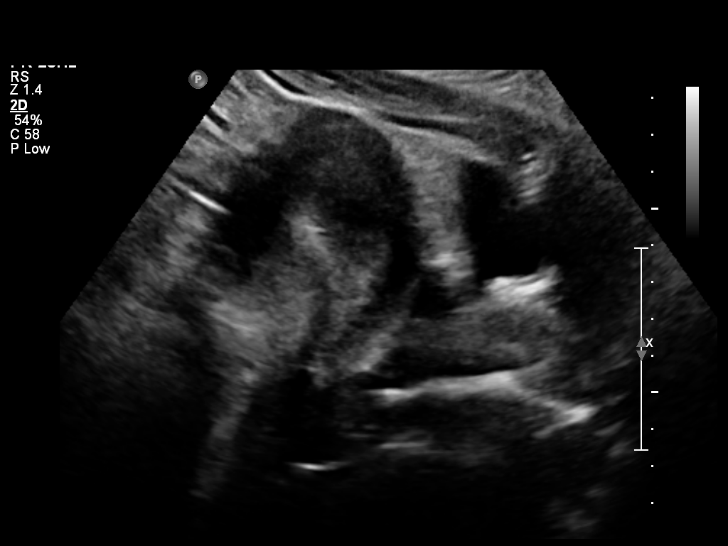
[im 15/58]
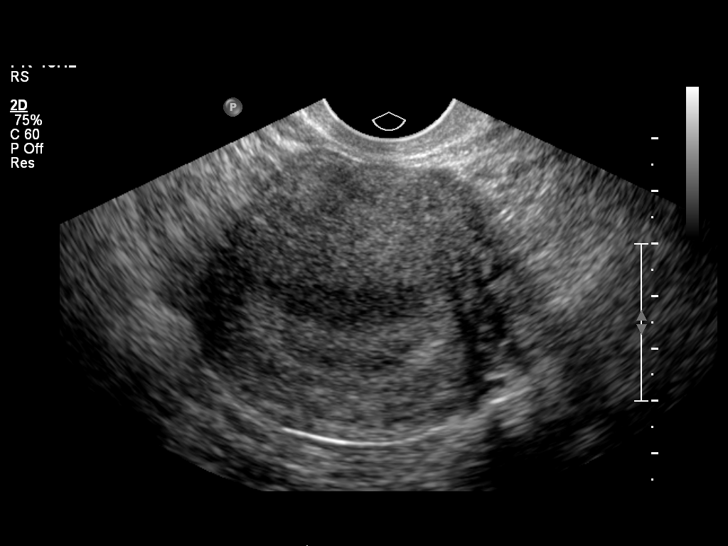
[im 20/58]
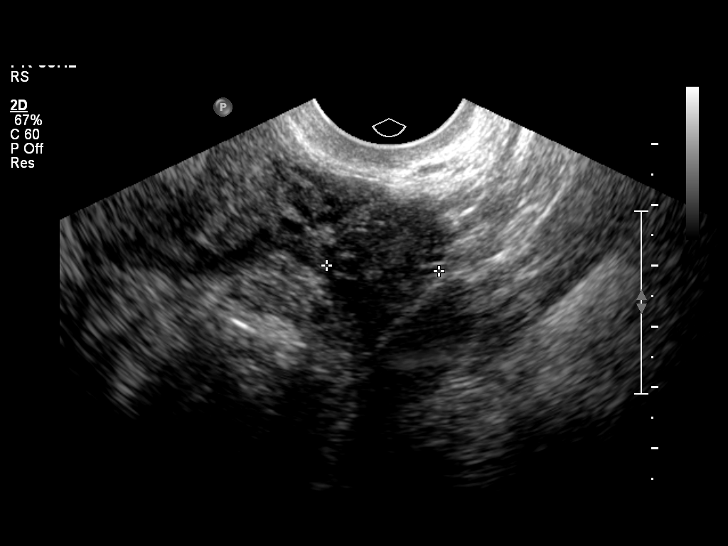
[im 24/58]
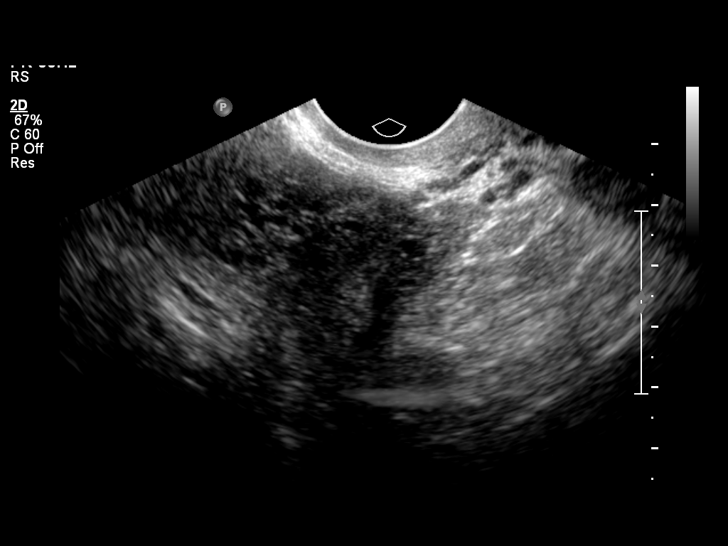
[im 30/58]
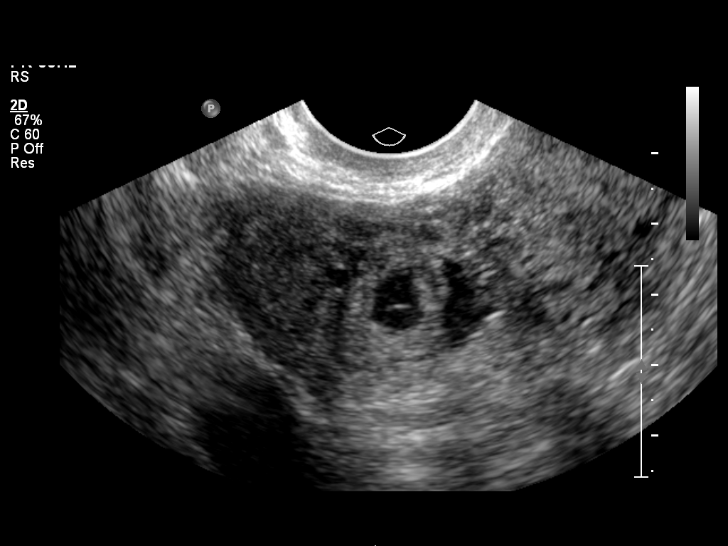
[im 34/58]
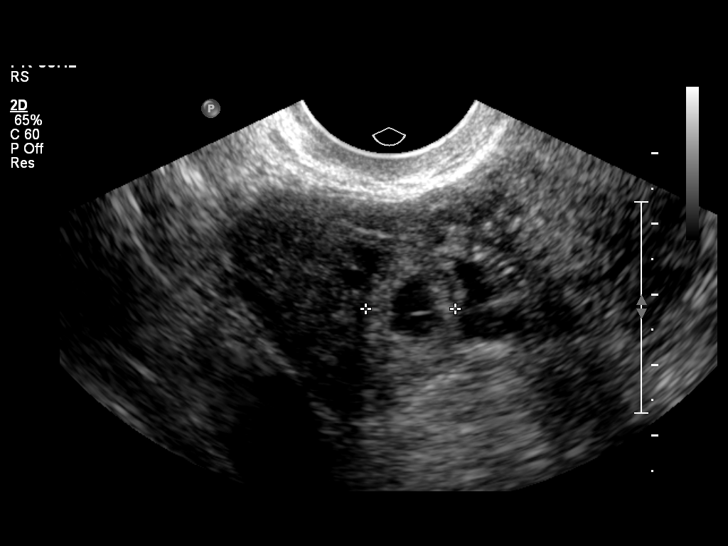
[im 39/58]
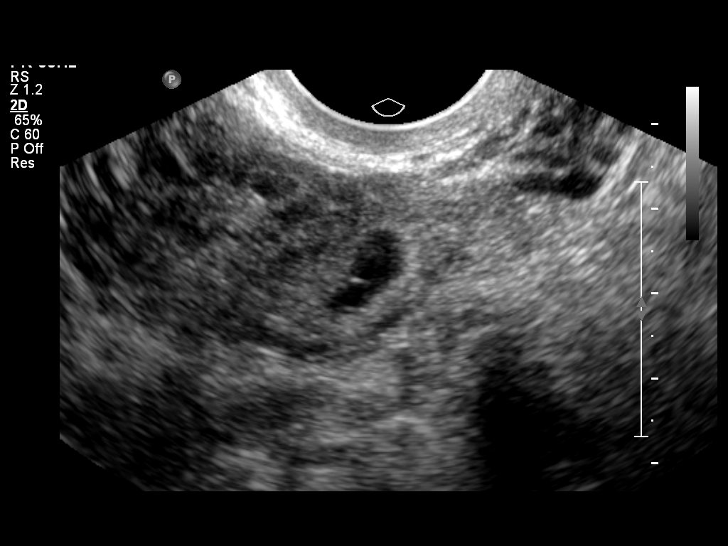
[im 43/58]
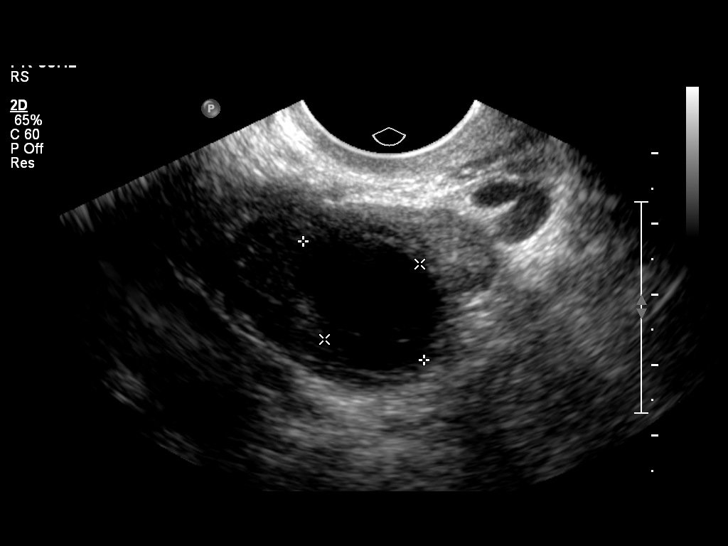
[im 47/58]
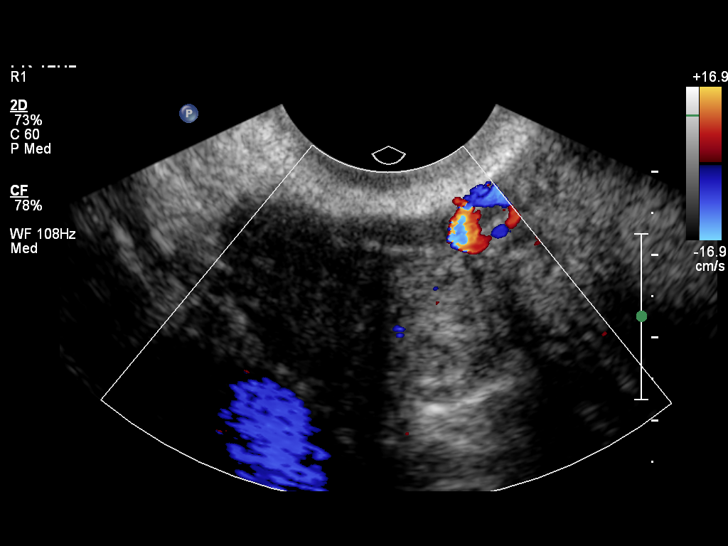
[im 51/58]
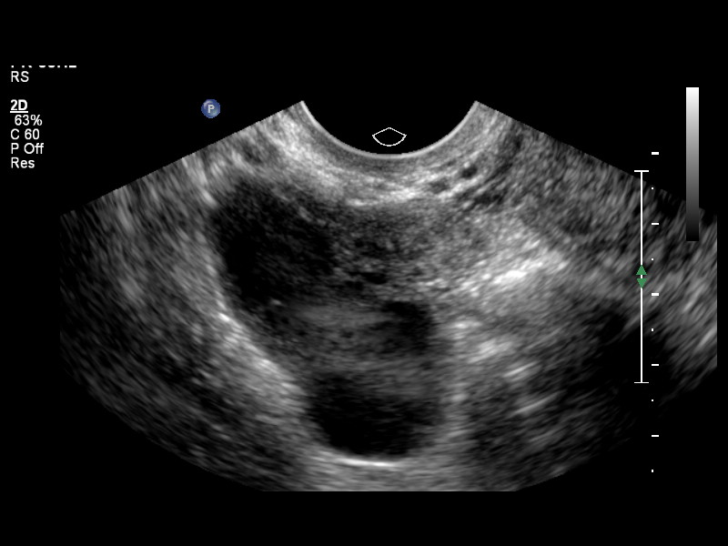
[im 55/58]
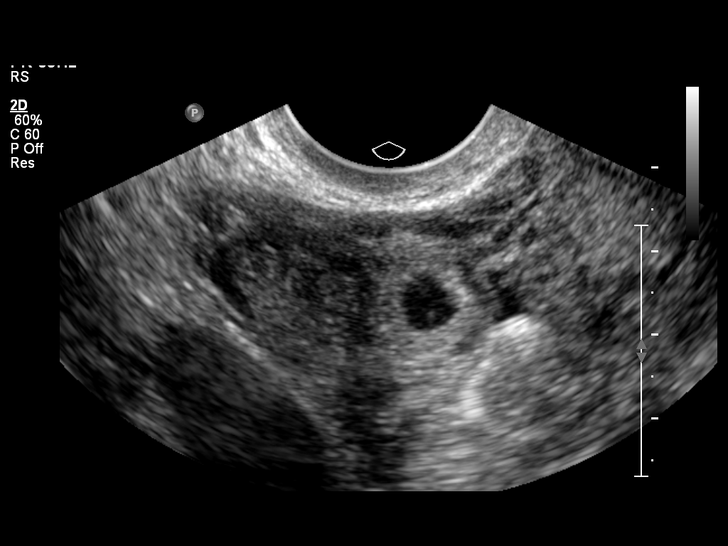

[13 of 28 positions shown; findings below may reference images not displayed]

Intrauterine gestational sac:  None
Yolk sac: None visualized
Embryo: None visualized
Cardiac Activity: None visualized

Maternal uterus/adnexae:
Along the medial margin of the right ovary, and between the uterine
body and right ovary, is a rounded echogenic mass with central
anechoic area that measures 1.6 x 1.2 x 1.3 cm.  No definite color
Doppler flow can be seen in this adnexal mass.  This is highly
suspicious for ectopic pregnancy.

The right ovary contains a 2.3 x 2.0 x 1.9 cm simple cyst.  The
left ovary is normal in size.  It contains a 1.0 x 0.6 x 0.6 cm
echogenic area centrally.  This could be a small ovarian dermoid.
A small to moderate amount of minimally complex fluid is identified
in the posterior cul-de-sac to the right of midline.
IMPRESSION: 1.  1.6 x 1.2 x 1.3 cm right adnexal mass is highly suspicious for
ectopic pregnancy.  There is a small amount of mildly complex free
fluid in the right adnexa/cul-de-sac.  Findings were discussed with
Rtoyota Joshjax, the patient's nurse, by the sonographer, [DATE].  2.3 cm simple right ovarian cyst.
3.  Probable 1.0 cm left ovarian dermoid.

## 2012-04-08 ENCOUNTER — Emergency Department (HOSPITAL_COMMUNITY)
Admission: EM | Admit: 2012-04-08 | Discharge: 2012-04-08 | Disposition: A | Discharge status: Home / Self Care | Payer: Medicaid Other | Attending: Emergency Medicine | Admitting: Emergency Medicine

## 2012-04-08 ENCOUNTER — Encounter (HOSPITAL_COMMUNITY): Payer: Self-pay | Admitting: Family Medicine

## 2012-04-08 DIAGNOSIS — IMO0002 Reserved for concepts with insufficient information to code with codable children: Secondary | ICD-10-CM | POA: Insufficient documentation

## 2012-04-08 DIAGNOSIS — K089 Disorder of teeth and supporting structures, unspecified: Secondary | ICD-10-CM | POA: Insufficient documentation

## 2012-04-08 DIAGNOSIS — K0889 Other specified disorders of teeth and supporting structures: Secondary | ICD-10-CM

## 2012-04-08 DIAGNOSIS — F17200 Nicotine dependence, unspecified, uncomplicated: Secondary | ICD-10-CM | POA: Insufficient documentation

## 2012-04-08 MED ORDER — HYDROCODONE-ACETAMINOPHEN 5-325 MG PO TABS: 1.0000 | ORAL_TABLET | ORAL | Status: DC | PRN

## 2012-04-08 MED ORDER — PENICILLIN V POTASSIUM 500 MG PO TABS: 500.0000 mg | ORAL_TABLET | Freq: Four times a day (QID) | ORAL | Status: AC

## 2012-04-08 NOTE — ED Notes (Signed)
Patient states that she has had a toothache since Wednesday. Has taken Ibuprofen without relief of symptoms.  Dental caries noted to right lower rear molar.

## 2012-04-08 NOTE — ED Provider Notes (Signed)
Medical screening examination/treatment/procedure(s) were performed by non-physician practitioner and as supervising physician I was immediately available for consultation/collaboration.  Jodette Wik M Amanda Steuart, MD 04/08/12 0627 

## 2012-04-08 NOTE — ED Provider Notes (Signed)
History     CSN: [id number]  Arrival date & time 04/08/12  0127   First MD Initiated Contact with Patient 04/08/12 0221      Chief Complaint  Patient presents with  . Dental Pain    (Consider location/radiation/quality/duration/timing/severity/associated sxs/prior treatment) HPI History provided by pt.   Pt c/o 2 days of severe right lower toothache.  Aggravated by talking and eating/drinking.  No relief w/ ibuprofen.  No associated fever.  Has a dentist that she plans to call on Monday.  History reviewed. No pertinent past medical history.  History reviewed. No pertinent past surgical history.  Family History  Problem Relation Age of Onset  . Hypertension Mother   . Diabetes Father   . Diabetes Maternal Grandmother     History  Substance Use Topics  . Smoking status: Current Every Day Smoker -- 0.5 packs/day    Types: Cigarettes  . Smokeless tobacco: Never Used  . Alcohol Use: No    OB History    Grav Para Term Preterm Abortions TAB SAB Ect Mult Living                  Review of Systems  All other systems reviewed and are negative.    Allergies  Review of patient's allergies indicates no known allergies.  Home Medications   Current Outpatient Rx  Name  Route  Sig  Dispense  Refill  . HYDROCODONE-ACETAMINOPHEN 5-325 MG PO TABS   Oral   Take 1 tablet by mouth every 4 (four) hours as needed for pain.   20 tablet   0   . MEDROXYPROGESTERONE ACETATE 150 MG/ML IM SUSP   Intramuscular   Inject 1 mL (150 mg total) into the muscle every 3 (three) months.   1 mL   3   . PENICILLIN V POTASSIUM 500 MG PO TABS   Oral   Take 1 tablet (500 mg total) by mouth 4 (four) times daily.   40 tablet   0     BP 145/89  Pulse 103  Temp 99.5 F (37.5 C) (Oral)  Resp 18  Wt 217 lb 8 oz (98.657 kg)  SpO2 100%  LMP 01/28/2012  Physical Exam  Nursing note and vitals reviewed. Constitutional: She is oriented to person, place, and time. She appears  well-developed and well-nourished.  HENT:  Head: Normocephalic and atraumatic. No trismus in the jaw.  Mouth/Throat: Uvula is midline and oropharynx is clear and moist.       Right lower 3rd molar mildy decayed and ttp. There is a partially avulsed piece of gingiva between 2nd and 3rd molars.  No edema/erythema of gingiva or buccal mucosa.    Eyes:       Normal appearance  Neck: Normal range of motion. Neck supple.       No submandibular edema  Lymphadenopathy:    She has no cervical adenopathy.  Neurological: She is alert and oriented to person, place, and time.  Psychiatric: She has a normal mood and affect. Her behavior is normal.    ED Course  Procedures (including critical care time)  Labs Reviewed - No data to display No results found.   1. Toothache       MDM  31yo F presents w/ right lower molar pain.  Will treat for possible periapical abscess.  Prescribed penicillin and vicodin.  She has a dentist to follow up with.  Return precautions discussed. 3:24 AM         Arie Sabina  Jil Penland, PA-C 04/08/12 [phone]

## 2012-04-21 ENCOUNTER — Ambulatory Visit (INDEPENDENT_AMBULATORY_CARE_PROVIDER_SITE_OTHER): Payer: Medicaid Other | Admitting: General Practice

## 2012-04-21 VITALS — BP 134/85 | HR 105 | Temp 98.7°F | Ht 65.0 in | Wt 216.1 lb

## 2012-04-21 DIAGNOSIS — Z3049 Encounter for surveillance of other contraceptives: Secondary | ICD-10-CM

## 2012-04-21 MED ORDER — MEDROXYPROGESTERONE ACETATE 150 MG/ML IM SUSP
150.0000 mg | Freq: Once | INTRAMUSCULAR | Status: AC
  Administered 2012-04-21: 150 mg via INTRAMUSCULAR
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
## 2012-06-24 ENCOUNTER — Other Ambulatory Visit: Payer: Self-pay

## 2012-07-07 ENCOUNTER — Encounter: Payer: Self-pay | Admitting: Obstetrics & Gynecology

## 2012-07-07 ENCOUNTER — Ambulatory Visit: Payer: Medicaid Other

## 2012-07-14 ENCOUNTER — Ambulatory Visit: Payer: Medicaid Other

## 2012-07-24 ENCOUNTER — Ambulatory Visit (INDEPENDENT_AMBULATORY_CARE_PROVIDER_SITE_OTHER): Payer: Medicaid Other | Admitting: Family Medicine

## 2012-07-24 ENCOUNTER — Encounter: Payer: Self-pay | Admitting: Family Medicine

## 2012-07-24 VITALS — BP 132/76 | Temp 98.8°F | Ht 65.0 in | Wt 212.0 lb

## 2012-07-24 DIAGNOSIS — M549 Dorsalgia, unspecified: Secondary | ICD-10-CM

## 2012-07-24 DIAGNOSIS — Z720 Tobacco use: Secondary | ICD-10-CM

## 2012-07-24 DIAGNOSIS — R519 Headache, unspecified: Secondary | ICD-10-CM | POA: Insufficient documentation

## 2012-07-24 DIAGNOSIS — R51 Headache: Secondary | ICD-10-CM

## 2012-07-24 DIAGNOSIS — R5383 Other fatigue: Secondary | ICD-10-CM

## 2012-07-24 DIAGNOSIS — G5600 Carpal tunnel syndrome, unspecified upper limb: Secondary | ICD-10-CM

## 2012-07-24 DIAGNOSIS — R5381 Other malaise: Secondary | ICD-10-CM

## 2012-07-24 DIAGNOSIS — F17200 Nicotine dependence, unspecified, uncomplicated: Secondary | ICD-10-CM

## 2012-07-24 LAB — CBC
HCT: 38.7 % (ref 36.0–46.0)
MCH: 27.7 pg (ref 26.0–34.0)
MCV: 78.3 fL (ref 78.0–100.0)
Platelets: 207 10*3/uL (ref 150–400)
RDW: 14.7 % (ref 11.5–15.5)
WBC: 7.9 10*3/uL (ref 4.0–10.5)

## 2012-07-24 NOTE — Progress Notes (Signed)
  Subjective:    Patient ID: Tonya Owens, female    DOB: 05-13-1980, 32 y.o.   MRN: [id number]  Headache  This is a recurrent problem. The problem occurs intermittently. The pain is located in the left unilateral region. The pain quality is similar to prior headaches. The quality of the pain is described as stabbing. The pain is mild. Associated symptoms include back pain, nausea and numbness (hands with sleeping, ? carpal tunnel). Pertinent negatives include no coughing or visual change. The symptoms are aggravated by caffeine withdrawal. She has tried NSAIDs and acetaminophen for the symptoms.  Nicotine Dependence Presents for initial visit. Preferred tobacco types include cigarettes. Preferred cigarette types include filtered. Preferred strength is regular. Her urge triggers include company of smokers. She smokes < 1/2 a pack of cigarettes per day. She started smoking when she was >76 years old. Tonya Owens is ready to quit. Tonya Owens has tried to quit 2 times.  Back Pain The current episode started more than 1 month ago. The problem is unchanged. The pain is present in the lumbar spine. The symptoms are aggravated by standing and twisting. Associated symptoms include headaches and numbness (hands with sleeping, ? carpal tunnel). Risk factors include lack of exercise and poor posture. She has tried NSAIDs for the symptoms.      Review of Systems  Respiratory: Positive for shortness of breath (? of Chronic bronchitis). Negative for cough.   Gastrointestinal: Positive for nausea.  Musculoskeletal: Positive for back pain.  Neurological: Positive for numbness (hands with sleeping, ? carpal tunnel) and headaches.       Objective:   Physical Exam  Vitals reviewed. Constitutional: She is oriented to person, place, and time. She appears well-developed and well-nourished.  HENT:  Head: Normocephalic and atraumatic.  Neck: Neck supple.  Cardiovascular: Normal rate and regular rhythm.   No murmur  heard. Pulmonary/Chest: Effort normal and breath sounds normal.  Abdominal: Soft. There is no tenderness.  Musculoskeletal: Normal range of motion. She exhibits no tenderness (back).  Neurological: She is alert and oriented to person, place, and time. She displays normal reflexes.  Skin: Skin is warm and dry.          Assessment & Plan:

## 2012-07-24 NOTE — Assessment & Plan Note (Signed)
Could be migrainous, related to vision, caffeine effect, and poor hydration. All of this was discussed with pt.  She will decrease her soda intake, improve hydration status, see eye MD.

## 2012-07-24 NOTE — Assessment & Plan Note (Signed)
Check TSH and CBC

## 2012-07-24 NOTE — Assessment & Plan Note (Signed)
Interested in quitting--discussed setting quit date, alternatives to smoking.

## 2012-07-24 NOTE — Assessment & Plan Note (Signed)
Trial of wrist splints  

## 2012-07-24 NOTE — Patient Instructions (Signed)
Fatigue Fatigue is a feeling of tiredness, lack of energy, lack of motivation, or feeling tired all the time. Having enough rest, good nutrition, and reducing stress will normally reduce fatigue. Consult your caregiver if it persists. The nature of your fatigue will help your caregiver to find out its cause. The treatment is based on the cause.  CAUSES  There are many causes for fatigue. Most of the time, fatigue can be traced to one or more of your habits or routines. Most causes fit into one or more of three general areas. They are: Lifestyle problems  Sleep disturbances.  Overwork.  Physical exertion.  Unhealthy habits.  Poor eating habits or eating disorders.  Alcohol and/or drug use .  Lack of proper nutrition (malnutrition). Psychological problems  Stress and/or anxiety problems.  Depression.  Grief.  Boredom. Medical Problems or Conditions  Anemia.  Pregnancy.  Thyroid gland problems.  Recovery from major surgery.  Continuous pain.  Emphysema or asthma that is not well controlled  Allergic conditions.  Diabetes.  Infections (such as mononucleosis).  Obesity.  Sleep disorders, such as sleep apnea.  Heart failure or other heart-related problems.  Cancer.  Kidney disease.  Liver disease.  Effects of certain medicines such as antihistamines, cough and cold remedies, prescription pain medicines, heart and blood pressure medicines, drugs used for treatment of cancer, and some antidepressants. SYMPTOMS  The symptoms of fatigue include:   Lack of energy.  Lack of drive (motivation).  Drowsiness.  Feeling of indifference to the surroundings. DIAGNOSIS  The details of how you feel help guide your caregiver in finding out what is causing the fatigue. You will be asked about your present and past health condition. It is important to review all medicines that you take, including prescription and non-prescription items. A thorough exam will be done.  You will be questioned about your feelings, habits, and normal lifestyle. Your caregiver may suggest blood tests, urine tests, or other tests to look for common medical causes of fatigue.  TREATMENT  Fatigue is treated by correcting the underlying cause. For example, if you have continuous pain or depression, treating these causes will improve how you feel. Similarly, adjusting the dose of certain medicines will help in reducing fatigue.  HOME CARE INSTRUCTIONS   Try to get the required amount of good sleep every night.  Eat a healthy and nutritious diet, and drink enough water throughout the day.  Practice ways of relaxing (including yoga or meditation).  Exercise regularly.  Make plans to change situations that cause stress. Act on those plans so that stresses decrease over time. Keep your work and personal routine reasonable.  Avoid street drugs and minimize use of alcohol.  Start taking a daily multivitamin after consulting your caregiver. SEEK MEDICAL CARE IF:   You have persistent tiredness, which cannot be accounted for.  You have fever.  You have unintentional weight loss.  You have headaches.  You have disturbed sleep throughout the night.  You are feeling sad.  You have constipation.  You have dry skin.  You have gained weight.  You are taking any new or different medicines that you suspect are causing fatigue.  You are unable to sleep at night.  You develop any unusual swelling of your legs or other parts of your body. SEEK IMMEDIATE MEDICAL CARE IF:   You are feeling confused.  Your vision is blurred.  You feel faint or pass out.  You develop severe headache.  You develop severe abdominal, pelvic, or   back pain.  You develop chest pain, shortness of breath, or an irregular or fast heartbeat.  You are unable to pass a normal amount of urine.  You develop abnormal bleeding such as bleeding from the rectum or you vomit blood.  You have thoughts  about harming yourself or committing suicide.  You are worried that you might harm someone else. MAKE SURE YOU:   Understand these instructions.  Will watch your condition.  Will get help right away if you are not doing well or get worse. Document Released: 02/21/2007 Document Revised: 07/19/2011 Document Reviewed: 02/21/2007 [facility] Patient Information 2013 [place], [place]. Lumbosacral Strain Lumbosacral strain is one of the most common causes of back pain. There are many causes of back pain. Most are not serious conditions. CAUSES  Your backbone (spinal column) is made up of 24 main vertebral bodies, the sacrum, and the coccyx. These are held together by muscles and tough, fibrous tissue (ligaments). Nerve roots pass through the openings between the vertebrae. A sudden move or injury to the back may cause injury to, or pressure on, these nerves. This may result in localized back pain or pain movement (radiation) into the buttocks, down the leg, and into the foot. Sharp, shooting pain from the buttock down the back of the leg (sciatica) is frequently associated with a ruptured (herniated) disk. Pain may be caused by muscle spasm alone. Your caregiver can often find the cause of your pain by the details of your symptoms and an exam. In some cases, you may need tests (such as X-rays). Your caregiver will work with you to decide if any tests are needed based on your specific exam. HOME CARE INSTRUCTIONS   Avoid an underactive lifestyle. Active exercise, as directed by your caregiver, is your greatest weapon against back pain.  Avoid hard physical activities (tennis, racquetball, waterskiing) if you are not in proper physical condition for it. This may aggravate or create problems.  If you have a back problem, avoid sports requiring sudden body movements. Swimming and walking are generally safer activities.  Maintain good posture.  Avoid becoming overweight (obese).  Use bed rest for only  the most extreme, sudden (acute) episode. Your caregiver will help you determine how much bed rest is necessary.  For acute conditions, you may put ice on the injured area.  Put ice in a plastic bag.  Place a towel between your skin and the bag.  Leave the ice on for 15 to 20 minutes at a time, every 2 hours, or as needed.  After you are improved and more active, it may help to apply heat for 30 minutes before activities. See your caregiver if you are having pain that lasts longer than expected. Your caregiver can advise appropriate exercises or therapy if needed. With conditioning, most back problems can be avoided. SEEK IMMEDIATE MEDICAL CARE IF:   You have numbness, tingling, weakness, or problems with the use of your arms or legs.  You experience severe back pain not relieved with medicines.  There is a change in bowel or bladder control.  You have increasing pain in any area of the body, including your belly (abdomen).  You notice shortness of breath, dizziness, or feel faint.  You feel sick to your stomach (nauseous), are throwing up (vomiting), or become sweaty.  You notice discoloration of your toes or legs, or your feet get very cold.  Your back pain is getting worse.  You have a fever. MAKE SURE YOU:   Understand these  instructions.  Will watch your condition.  Will get help right away if you are not doing well or get worse. Document Released: 02/03/2005 Document Revised: 07/19/2011 Document Reviewed: 07/26/2008 [facility] Patient Information 2013 [place], [place]. Smoking Cessation Quitting smoking is important to your health and has many advantages. However, it is not always easy to quit since nicotine is a very addictive drug. Often times, people try 3 times or more before being able to quit. This document explains the best ways for you to prepare to quit smoking. Quitting takes hard work and a lot of effort, but you can do it. ADVANTAGES OF QUITTING SMOKING  You  will live longer, feel better, and live better.  Your body will feel the impact of quitting smoking almost immediately.  Within 20 minutes, blood pressure decreases. Your pulse returns to its normal level.  After 8 hours, carbon monoxide levels in the blood return to normal. Your oxygen level increases.  After 24 hours, the chance of having a heart attack starts to decrease. Your breath, hair, and body stop smelling like smoke.  After 48 hours, damaged nerve endings begin to recover. Your sense of taste and smell improve.  After 72 hours, the body is virtually free of nicotine. Your bronchial tubes relax and breathing becomes easier.  After 2 to 12 weeks, lungs can hold more air. Exercise becomes easier and circulation improves.  The risk of having a heart attack, stroke, cancer, or lung disease is greatly reduced.  After 1 year, the risk of coronary heart disease is cut in half.  After 5 years, the risk of stroke falls to the same as a nonsmoker.  After 10 years, the risk of lung cancer is cut in half and the risk of other cancers decreases significantly.  After 15 years, the risk of coronary heart disease drops, usually to the level of a nonsmoker.  If you are pregnant, quitting smoking will improve your chances of having a healthy baby.  The people you live with, especially any children, will be healthier.  You will have extra money to spend on things other than cigarettes. QUESTIONS TO THINK ABOUT BEFORE ATTEMPTING TO QUIT You may want to talk about your answers with your caregiver.  Why do you want to quit?  If you tried to quit in the past, what helped and what did not?  What will be the most difficult situations for you after you quit? How will you plan to handle them?  Who can help you through the tough times? Your family? Friends? A caregiver?  What pleasures do you get from smoking? What ways can you still get pleasure if you quit? Here are some questions to ask  your caregiver:  How can you help me to be successful at quitting?  What medicine do you think would be best for me and how should I take it?  What should I do if I need more help?  What is smoking withdrawal like? How can I get information on withdrawal? GET READY  Set a quit date.  Change your environment by getting rid of all cigarettes, ashtrays, matches, and lighters in your home, car, or work. Do not let people smoke in your home.  Review your past attempts to quit. Think about what worked and what did not. GET SUPPORT AND ENCOURAGEMENT You have a better chance of being successful if you have help. You can get support in many ways.  Tell your family, friends, and co-workers that you are going  to quit and need their support. Ask them not to smoke around you.  Get individual, group, or telephone counseling and support. Programs are available at [employer] and health centers. Call your local health department for information about programs in your area.  Spiritual beliefs and practices may help some smokers quit.  Download a "quit meter" on your computer to keep track of quit statistics, such as how long you have gone without smoking, cigarettes not smoked, and money saved.  Get a self-help book about quitting smoking and staying off of tobacco. LEARN NEW SKILLS AND BEHAVIORS  Distract yourself from urges to smoke. Talk to someone, go for a walk, or occupy your time with a task.  Change your normal routine. Take a different route to work. Drink tea instead of coffee. Eat breakfast in a different place.  Reduce your stress. Take a hot bath, exercise, or read a book.  Plan something enjoyable to do every day. Reward yourself for not smoking.  Explore interactive web-based programs that specialize in helping you quit. GET MEDICINE AND USE IT CORRECTLY Medicines can help you stop smoking and decrease the urge to smoke. Combining medicine with the above behavioral methods  and support can greatly increase your chances of successfully quitting smoking.  Nicotine replacement therapy helps deliver nicotine to your body without the negative effects and risks of smoking. Nicotine replacement therapy includes nicotine gum, lozenges, inhalers, nasal sprays, and skin patches. Some may be available over-the-counter and others require a prescription.  Antidepressant medicine helps people abstain from smoking, but how this works is unknown. This medicine is available by prescription.  Nicotinic receptor partial agonist medicine simulates the effect of nicotine in your brain. This medicine is available by prescription. Ask your caregiver for advice about which medicines to use and how to use them based on your health history. Your caregiver will tell you what side effects to look out for if you choose to be on a medicine or therapy. Carefully read the information on the package. Do not use any other product containing nicotine while using a nicotine replacement product.  RELAPSE OR DIFFICULT SITUATIONS Most relapses occur within the first 3 months after quitting. Do not be discouraged if you start smoking again. Remember, most people try several times before finally quitting. You may have symptoms of withdrawal because your body is used to nicotine. You may crave cigarettes, be irritable, feel very hungry, cough often, get headaches, or have difficulty concentrating. The withdrawal symptoms are only temporary. They are strongest when you first quit, but they will go away within 10 14 days. To reduce the chances of relapse, try to:  Avoid drinking alcohol. Drinking lowers your chances of successfully quitting.  Reduce the amount of caffeine you consume. Once you quit smoking, the amount of caffeine in your body increases and can give you symptoms, such as a rapid heartbeat, sweating, and anxiety.  Avoid smokers because they can make you want to smoke.  Do not let weight gain  distract you. Many smokers will gain weight when they quit, usually less than 10 pounds. Eat a healthy diet and stay active. You can always lose the weight gained after you quit.  Find ways to improve your mood other than smoking. FOR MORE INFORMATION  www.smokefree.gov  Document Released: 04/20/2001 Document Revised: 10/26/2011 Document Reviewed: 08/05/2011 [facility] Patient Information 2013 [place], [place].

## 2012-07-24 NOTE — Assessment & Plan Note (Signed)
May be related to old mattress and poor posture.

## 2012-09-01 ENCOUNTER — Ambulatory Visit: Payer: Medicaid Other | Admitting: Obstetrics & Gynecology

## 2012-09-27 ENCOUNTER — Encounter: Payer: Self-pay | Admitting: Obstetrics & Gynecology

## 2012-09-27 ENCOUNTER — Ambulatory Visit (INDEPENDENT_AMBULATORY_CARE_PROVIDER_SITE_OTHER): Payer: Medicaid Other | Admitting: Obstetrics & Gynecology

## 2012-09-27 VITALS — BP 139/90 | HR 101 | Temp 98.3°F | Ht 65.0 in | Wt 216.0 lb

## 2012-09-27 DIAGNOSIS — N898 Other specified noninflammatory disorders of vagina: Secondary | ICD-10-CM

## 2012-09-27 LAB — POCT PREGNANCY, URINE: Preg Test, Ur: NEGATIVE

## 2012-09-27 MED ORDER — NORGESTREL-ETHINYL ESTRADIOL 0.3-30 MG-MCG PO TABS: 1.0000 | ORAL_TABLET | Freq: Every day | ORAL | Status: DC

## 2012-09-27 MED ORDER — METRONIDAZOLE 500 MG PO TABS: 500.0000 mg | ORAL_TABLET | Freq: Two times a day (BID) | ORAL | Status: DC

## 2012-09-27 NOTE — Progress Notes (Signed)
  Subjective:    Patient ID: Tonya Owens, female    DOB: 09-21-1980, 32 y.o.   MRN: [id number]  HPI  32 yo S AA who is here today for 2 reasons. She has a non-itchy vaginal discharge and would like STI testing. She also would like to restart OCPs. She has quit smoking. She is currently using condoms at times and withdrawal at others.  Review of Systems Pap normal and UTD 9/13    Objective:   Physical Exam  Vaginal discharge white and frothy c/w BV      Assessment & Plan:  Symptomatic discharge- treat with flagyl. NO ETOH Contraception-Lo ovral. Start with NMP. Back up method rec'd for 1 month of OCPs. NO smoking! She is requesting blood work for STIs, including HSV.

## 2012-09-28 LAB — WET PREP, GENITAL
Trich, Wet Prep: NONE SEEN
Yeast Wet Prep HPF POC: NONE SEEN

## 2012-09-28 LAB — RPR

## 2012-09-28 LAB — HIV ANTIBODY (ROUTINE TESTING W REFLEX): HIV: NONREACTIVE

## 2012-09-28 LAB — HSV 2 ANTIBODY, IGG: HSV 2 Glycoprotein G Ab, IgG: 2.73 IV — ABNORMAL HIGH

## 2013-01-04 ENCOUNTER — Ambulatory Visit: Payer: Medicaid Other | Admitting: Family Medicine

## 2013-01-31 ENCOUNTER — Ambulatory Visit: Payer: Medicaid Other | Admitting: Family Medicine

## 2013-03-15 ENCOUNTER — Other Ambulatory Visit: Payer: Self-pay

## 2013-04-02 ENCOUNTER — Emergency Department (HOSPITAL_COMMUNITY)
Admission: EM | Admit: 2013-04-02 | Discharge: 2013-04-02 | Disposition: A | Discharge status: Home / Self Care | Payer: Medicaid Other | Source: Home / Self Care | Attending: Family Medicine | Admitting: Family Medicine

## 2013-04-02 ENCOUNTER — Encounter (HOSPITAL_COMMUNITY): Payer: Self-pay | Admitting: Emergency Medicine

## 2013-04-02 DIAGNOSIS — K2970 Gastritis, unspecified, without bleeding: Secondary | ICD-10-CM

## 2013-04-02 LAB — POCT URINALYSIS DIP (DEVICE)
Ketones, ur: NEGATIVE mg/dL
Protein, ur: >=300 mg/dL — AB
Specific Gravity, Urine: 1.02 (ref 1.005–1.030)
Urobilinogen, UA: 1 mg/dL (ref 0.0–1.0)
pH: 6.5 (ref 5.0–8.0)

## 2013-04-02 LAB — POCT PREGNANCY, URINE: Preg Test, Ur: NEGATIVE

## 2013-04-02 MED ORDER — GI COCKTAIL ~~LOC~~
ORAL | Status: AC
  Filled 2013-04-02: qty 30

## 2013-04-02 MED ORDER — GI COCKTAIL ~~LOC~~
30.0000 mL | Freq: Once | ORAL | Status: AC
  Administered 2013-04-02: 30 mL via ORAL

## 2013-04-02 MED ORDER — OMEPRAZOLE 40 MG PO CPDR: 40.0000 mg | DELAYED_RELEASE_CAPSULE | Freq: Every day | ORAL | Status: DC

## 2013-04-02 NOTE — ED Notes (Addendum)
Pt c/o intermittent abd pain onset yest  Has noticed she has been burping more than usual and pain increases after certain foods Sxs also include:  Took pepto bismol w/some relief Denies: f/v/d, urinary sxs, gyn sxs Alert w/no signs of acute distress.

## 2013-04-02 NOTE — ED Provider Notes (Signed)
Tonya Owens is a 32 y.o. female who presents to Urgent Care today for epigastric abdominal pain present for the last few days. Patient notes increased burping and some burning in her throat. She suspects that she may have reflux. She tried ibuprofen but that did not help much. She tried Pepto-Bismol yesterday evening which seemed to help a lot. She denies any vomiting or diarrhea but does note some nausea. She denies any blood in her stool or black tarry stool. She denies any dysuria or urinary frequency or urgency or vaginal discharge.   History reviewed. No pertinent past medical history. History  Substance Use Topics  . Smoking status: Former Smoker -- 0.50 packs/day for 13 years    Types: Cigarettes    Start date: 07/24/1996  . Smokeless tobacco: Never Used  . Alcohol Use: No   ROS as above Medications reviewed. No current facility-administered medications for this encounter.   Current Outpatient Prescriptions  Medication Sig Dispense Refill  . HYDROcodone-acetaminophen (NORCO/VICODIN) 5-325 MG per tablet Take 1 tablet by mouth every 4 (four) hours as needed for pain.  20 tablet  0  . medroxyPROGESTERone (DEPO-PROVERA) 150 MG/ML injection Inject 1 mL (150 mg total) into the muscle every 3 (three) months.  1 mL  3  . norgestrel-ethinyl estradiol (LO/OVRAL,CRYSELLE) 0.3-30 MG-MCG tablet Take 1 tablet by mouth daily.  1 Package  11  . omeprazole (PRILOSEC) 40 MG capsule Take 1 capsule (40 mg total) by mouth daily.  30 capsule  1    Exam:  BP 148/87  Pulse 85  Temp(Src) 98.7 F (37.1 C) (Oral)  Resp 16  SpO2 100% Gen: Well NAD HEENT: EOMI,  MMM Lungs: Normal work of breathing. CTABL Heart: RRR no MRG Abd: NABS, Soft. Nondistended. Mildly tender upper left quadrant. Nontender upper right quadrant. Negative Murphy sign. Exts: Non edematous BL  LE, warm and well perfused.   Patient was given a GI cocktail and had improvement in symptoms  Results for orders placed during the  hospital encounter of 04/02/13 (from the past 24 hour(s))  POCT URINALYSIS DIP (DEVICE)     Status: Abnormal   Collection Time    04/02/13  2:04 PM      Result Value Range   Glucose, UA NEGATIVE  NEGATIVE mg/dL   Bilirubin Urine SMALL (*) NEGATIVE   Ketones, ur NEGATIVE  NEGATIVE mg/dL   Specific Gravity, Urine 1.020  1.005 - 1.030   Hgb urine dipstick LARGE (*) NEGATIVE   pH 6.5  5.0 - 8.0   Protein, ur >=300 (*) NEGATIVE mg/dL   Urobilinogen, UA 1.0  0.0 - 1.0 mg/dL   Nitrite NEGATIVE  NEGATIVE   Leukocytes, UA TRACE (*) NEGATIVE  POCT PREGNANCY, URINE     Status: None   Collection Time    04/02/13  2:08 PM      Result Value Range   Preg Test, Ur NEGATIVE  NEGATIVE   No results found.  Assessment and Plan: 32 y.o. female with  1) epigastric abdominal pain. Likely gastritis. She benefited from a GI cocktail. Plan to use omeprazole and followup with primary care provider 2) suspect urine is contaminated. We'll obtain culture and defer treatment at this time. Discussed warning signs or symptoms. Please see discharge instructions. Patient expresses understanding.      Rodolph Bong, MD 04/02/13 [phone]

## 2013-04-03 ENCOUNTER — Ambulatory Visit: Payer: Medicaid Other

## 2013-04-03 LAB — URINE CULTURE: Culture: NO GROWTH

## 2013-04-09 ENCOUNTER — Ambulatory Visit: Payer: Medicaid Other | Admitting: Family Medicine

## 2013-04-27 ENCOUNTER — Encounter: Payer: Self-pay | Admitting: Family Medicine

## 2013-04-27 ENCOUNTER — Ambulatory Visit (INDEPENDENT_AMBULATORY_CARE_PROVIDER_SITE_OTHER): Payer: Medicaid Other | Admitting: Family Medicine

## 2013-04-27 ENCOUNTER — Ambulatory Visit: Payer: Medicaid Other | Admitting: Family Medicine

## 2013-04-27 VITALS — BP 125/94 | HR 100 | Temp 99.0°F | Ht 65.0 in | Wt 224.0 lb

## 2013-04-27 DIAGNOSIS — K2990 Gastroduodenitis, unspecified, without bleeding: Secondary | ICD-10-CM

## 2013-04-27 DIAGNOSIS — L509 Urticaria, unspecified: Secondary | ICD-10-CM

## 2013-04-27 DIAGNOSIS — K2970 Gastritis, unspecified, without bleeding: Secondary | ICD-10-CM | POA: Insufficient documentation

## 2013-04-27 MED ORDER — RANITIDINE HCL 150 MG PO TABS: 150.0000 mg | ORAL_TABLET | Freq: Two times a day (BID) | ORAL | Status: DC

## 2013-04-27 MED ORDER — OMEPRAZOLE 2 MG/ML ORAL SUSPENSION: 40.0000 mg | Freq: Every day | ORAL | Status: DC

## 2013-04-27 MED ORDER — CETIRIZINE HCL 10 MG PO TABS: 10.0000 mg | ORAL_TABLET | Freq: Two times a day (BID) | ORAL | Status: DC

## 2013-04-27 NOTE — Assessment & Plan Note (Signed)
Hx of gastritis and still with pain - ok to restart prilosec - start bid zantac for the next few weeks until it goes into effect - f/u with PCP to discuss further

## 2013-04-27 NOTE — Progress Notes (Signed)
Patient ID: Tonya Owens, female   DOB: 1980/08/24, 32 y.o.   MRN: [id number] FAMILY MEDICINE OFFICE NOTE  Chief Complaint:  ?allergic reaction  Primary Care Physician: Reva Bores, MD  HPI:  Tonya Owens is here for possible allergic reaction  - went to urgent care 2.5 weeks ago and was diagnosed with gastritis due to significant amounts of ibuprofen.  - was started on prilosec - ever since she started it, she notices some bumps and itching  - thinks she is allergic to the prilosec - only happens at night - stopped the medicine one week ago but still had these red bumps and itching come up almost every night this week including last night - happening nightly for the last 2 weeks  - no fevers, chills, sob, swelling in mouth, cp, nausea, vomiting, diarrhea - no blood in stool - changed detergents right before she got the bumps  - has a picture showing a raised red area. States they look like "whelps"    PMHx:  No past medical history on file.  No past surgical history on file.  FAMHx:  Family History  Problem Relation Age of Onset  . Hypertension Mother   . Diabetes Father   . Diabetes Maternal Grandmother     SOCHx:   reports that she has quit smoking. Her smoking use included Cigarettes. She started smoking about 16 years ago. She has a 6.5 pack-year smoking history. She has never used smokeless tobacco. She reports that she does not drink alcohol or use illicit drugs.  ALLERGIES:  No Known Allergies  ROS: Pertinent ROS as seen in HPI. Otherwise negative.   HOME MEDS: Current Outpatient Prescriptions  Medication Sig Dispense Refill  . cetirizine (ZYRTEC) 10 MG tablet Take 1 tablet (10 mg total) by mouth 2 (two) times daily.  30 tablet  0  . HYDROcodone-acetaminophen (NORCO/VICODIN) 5-325 MG per tablet Take 1 tablet by mouth every 4 (four) hours as needed for pain.  20 tablet  0  . medroxyPROGESTERone (DEPO-PROVERA) 150 MG/ML injection Inject 1 mL (150 mg  total) into the muscle every 3 (three) months.  1 mL  3  . norgestrel-ethinyl estradiol (LO/OVRAL,CRYSELLE) 0.3-30 MG-MCG tablet Take 1 tablet by mouth daily.  1 Package  11  . omeprazole (PRILOSEC) 2 mg/mL SUSP Take 20 mLs (40 mg total) by mouth daily.  1200 mL  3  . ranitidine (ZANTAC) 150 MG tablet Take 1 tablet (150 mg total) by mouth 2 (two) times daily.  60 tablet  2   No current facility-administered medications for this visit.    LABS/IMAGING: No results found for this or any previous visit (from the past 48 hour(s)). No results found.  VITALS: BP 125/94  Pulse 100  Temp(Src) 99 F (37.2 C) (Oral)  Ht 5\' 5"  (1.651 m)  Wt 224 lb (101.606 kg)  BMI 37.28 kg/m2  EXAM: Gen: NAD, well appearing HEENT: oropharynx clear, pupils equal and reactive PULM: LCTAB, no wheezes/rhonchi/rales CV: RRR, no murmurs ABD: soft, NT, ND EXT: 2+ DP pulses, no edema SKIN: no rashes present currently    ASSESSMENT: Hives  PLAN: See assessment and plan

## 2013-04-27 NOTE — Assessment & Plan Note (Signed)
Rash most consistent with hives currently given hx.  - likely due to recent change in detergent as there is a temporal relationship with this - change back to old detergent - rx of BID zyrtec for the next 2 weeks - if no improvement, advised to let us know.  - reassurance likely not due to prilosec.

## 2013-04-27 NOTE — Patient Instructions (Addendum)
We will treat you with some zyrtec for the itching Make sure you change your detergent also  For the stomach pain, lets try some zantac for the pain and go back on the omeprazole. I changed it to the syrup for you so that you don't have to take the big pills.

## 2013-05-24 ENCOUNTER — Ambulatory Visit (INDEPENDENT_AMBULATORY_CARE_PROVIDER_SITE_OTHER): Payer: Medicaid Other | Admitting: Family Medicine

## 2013-05-24 ENCOUNTER — Encounter: Payer: Self-pay | Admitting: Family Medicine

## 2013-05-24 VITALS — BP 122/80 | HR 84 | Temp 99.1°F | Ht 65.0 in | Wt 230.0 lb

## 2013-05-24 DIAGNOSIS — IMO0001 Reserved for inherently not codable concepts without codable children: Secondary | ICD-10-CM

## 2013-05-24 DIAGNOSIS — Z309 Encounter for contraceptive management, unspecified: Secondary | ICD-10-CM

## 2013-05-24 NOTE — Patient Instructions (Addendum)
Depression, Adult Depression refers to feeling sad, low, down in the dumps, blue, gloomy, or empty. In general, there are two kinds of depression: 1. Depression that we all experience from time to time because of upsetting life experiences, including the loss of a job or the ending of a relationship (normal sadness or normal grief). This kind of depression is considered normal, is short lived, and resolves within a few days to 2 weeks. (Depression experienced after the loss of a loved one is called bereavement. Bereavement often lasts longer than 2 weeks but normally gets better with time.) 2. Clinical depression, which lasts longer than normal sadness or normal grief or interferes with your ability to function at home, at work, and in school. It also interferes with your personal relationships. It affects almost every aspect of your life. Clinical depression is an illness. Symptoms of depression also can be caused by conditions other than normal sadness and grief or clinical depression. Examples of these conditions are listed as follows:  Physical illness Some physical illnesses, including underactive thyroid gland (hypothyroidism), severe anemia, specific types of cancer, diabetes, uncontrolled seizures, heart and lung problems, strokes, and chronic pain are commonly associated with symptoms of depression.  Side effects of some prescription medicine In some people, certain types of prescription medicine can cause symptoms of depression.  Substance abuse Abuse of alcohol and illicit drugs can cause symptoms of depression. SYMPTOMS Symptoms of normal sadness and normal grief include the following:  Feeling sad or crying for short periods of time.  Not caring about anything (apathy).  Difficulty sleeping or sleeping too much.  No longer able to enjoy the things you used to enjoy.  Desire to be by oneself all the time (social isolation).  Lack of energy or motivation.  Difficulty  concentrating or remembering.  Change in appetite or weight.  Restlessness or agitation. Symptoms of clinical depression include the same symptoms of normal sadness or normal grief and also the following symptoms:  Feeling sad or crying all the time.  Feelings of guilt or worthlessness.  Feelings of hopelessness or helplessness.  Thoughts of suicide or the desire to harm yourself (suicidal ideation).  Loss of touch with reality (psychotic symptoms). Seeing or hearing things that are not real (hallucinations) or having false beliefs about your life or the people around you (delusions and paranoia). DIAGNOSIS  The diagnosis of clinical depression usually is based on the severity and duration of the symptoms. Your caregiver also will ask you questions about your medical history and substance use to find out if physical illness, use of prescription medicine, or substance abuse is causing your depression. Your caregiver also may order blood tests. TREATMENT  Typically, normal sadness and normal grief do not require treatment. However, sometimes antidepressant medicine is prescribed for bereavement to ease the depressive symptoms until they resolve. The treatment for clinical depression depends on the severity of your symptoms but typically includes antidepressant medicine, counseling with a mental health professional, or a combination of both. Your caregiver will help to determine what treatment is best for you. Depression caused by physical illness usually goes away with appropriate medical treatment of the illness. If prescription medicine is causing depression, talk with your caregiver about stopping the medicine, decreasing the dose, or substituting another medicine. Depression caused by abuse of alcohol or illicit drugs abuse goes away with abstinence from these substances. Some adults need professional help in order to stop drinking or using drugs. SEEK IMMEDIATE CARE IF:  You have   thoughts  about hurting yourself or others.  You lose touch with reality (have psychotic symptoms).  You are taking medicine for depression and have a serious side effect. FOR MORE INFORMATION National Alliance on Mental Illness: www.nami.[employer]: https://carter.com/ Document Released: 04/23/2000 Document Revised: 10/26/2011 Document Reviewed: 07/26/2011 [facility] Patient Information 2014 [place]. Sleep Apnea  Sleep apnea is a sleep disorder characterized by abnormal pauses in breathing while you sleep. When your breathing pauses, the level of oxygen in your blood decreases. This causes you to move out of deep sleep and into light sleep. As a result, your quality of sleep is poor, and the system that carries your blood throughout your body (cardiovascular system) experiences stress. If sleep apnea remains untreated, the following conditions can develop:  High blood pressure (hypertension).  Coronary artery disease.  Inability to achieve or maintain an erection (impotence).  Impairment of your thought process (cognitive dysfunction). There are three types of sleep apnea: 1. Obstructive sleep apnea Pauses in breathing during sleep because of a blocked airway. 2. Central sleep apnea Pauses in breathing during sleep because the area of the brain that controls your breathing does not send the correct signals to the muscles that control breathing. 3. Mixed sleep apnea A combination of both obstructive and central sleep apnea. RISK FACTORS The following risk factors can increase your risk of developing sleep apnea:  Being overweight.  Smoking.  Having narrow passages in your nose and throat.  Being of older age.  Being female.  Alcohol use.  Sedative and tranquilizer use.  Ethnicity. Among individuals younger than 35 years, African Americans are at increased risk of sleep apnea. SYMPTOMS   Difficulty staying asleep.  Daytime sleepiness and  fatigue.  Loss of energy.  Irritability.  Loud, heavy snoring.  Morning headaches.  Trouble concentrating.  Forgetfulness.  Decreased interest in sex. DIAGNOSIS  In order to diagnose sleep apnea, your caregiver will perform a physical examination. Your caregiver may suggest that you take a home sleep test. Your caregiver may also recommend that you spend the night in a sleep lab. In the sleep lab, several monitors record information about your heart, lungs, and brain while you sleep. Your leg and arm movements and blood oxygen level are also recorded. TREATMENT The following actions may help to resolve mild sleep apnea:  Sleeping on your side.   Using a decongestant if you have nasal congestion.   Avoiding the use of depressants, including alcohol, sedatives, and narcotics.   Losing weight and modifying your diet if you are overweight. There also are devices and treatments to help open your airway:  Oral appliances. These are custom-made mouthpieces that shift your lower jaw forward and slightly open your bite. This opens your airway.  Devices that create positive airway pressure. This positive pressure "splints" your airway open to help you breathe better during sleep. The following devices create positive airway pressure:  Continuous positive airway pressure (CPAP) device. The CPAP device creates a continuous level of air pressure with an air pump. The air is delivered to your airway through a mask while you sleep. This continuous pressure keeps your airway open.  Nasal expiratory positive airway pressure (EPAP) device. The EPAP device creates positive air pressure as you exhale. The device consists of single-use valves, which are inserted into each nostril and held in place by adhesive. The valves create very little resistance when you inhale but create much more resistance when you exhale. That increased resistance creates  the positive airway pressure. This positive pressure  while you exhale keeps your airway open, making it easier to breath when you inhale again.  Bilevel positive airway pressure (BPAP) device. The BPAP device is used mainly in patients with central sleep apnea. This device is similar to the CPAP device because it also uses an air pump to deliver continuous air pressure through a mask. However, with the BPAP machine, the pressure is set at two different levels. The pressure when you exhale is lower than the pressure when you inhale.  Surgery. Typically, surgery is only done if you cannot comply with less invasive treatments or if the less invasive treatments do not improve your condition. Surgery involves removing excess tissue in your airway to create a wider passage way. Document Released: 04/16/2002 Document Revised: 08/21/2012 Document Reviewed: 09/02/2011 [facility] Patient Information 2014 [place], [place]. Etonogestrel implant What is this medicine? ETONOGESTREL (et oh noe JES trel) is a contraceptive (birth control) device. It is used to prevent pregnancy. It can be used for up to 3 years. This medicine may be used for other purposes; ask your health care provider or pharmacist if you have questions. COMMON BRAND NAME(S): Implanon, Nexplanon  What should I tell my health care provider before I take this medicine? They need to know if you have any of these conditions: -abnormal vaginal bleeding -blood vessel disease or blood clots -cancer of the breast, cervix, or liver -depression -diabetes -gallbladder disease -headaches -heart disease or recent heart attack -high blood pressure -high cholesterol -kidney disease -liver disease -renal disease -seizures -tobacco smoker -an unusual or allergic reaction to etonogestrel, other hormones, anesthetics or antiseptics, medicines, foods, dyes, or preservatives -pregnant or trying to get pregnant -breast-feeding How should I use this medicine? This device is inserted just under the skin on  the inner side of your upper arm by a health care professional. Talk to your pediatrician regarding the use of this medicine in children. Special care may be needed. Overdosage: If you think you've taken too much of this medicine contact a poison control center or emergency room at once. Overdosage: If you think you have taken too much of this medicine contact a poison control center or emergency room at once. NOTE: This medicine is only for you. Do not share this medicine with others. What if I miss a dose? This does not apply. What may interact with this medicine? Do not take this medicine with any of the following medications: -amprenavir -bosentan -fosamprenavir This medicine may also interact with the following medications: -barbiturate medicines for inducing sleep or treating seizures -certain medicines for fungal infections like ketoconazole and itraconazole -griseofulvin -medicines to treat seizures like carbamazepine, felbamate, oxcarbazepine, phenytoin, topiramate -modafinil -phenylbutazone -rifampin -some medicines to treat HIV infection like atazanavir, indinavir, lopinavir, nelfinavir, tipranavir, ritonavir -St. John's wort This list may not describe all possible interactions. Give your health care provider a list of all the medicines, herbs, non-prescription drugs, or dietary supplements you use. Also tell them if you smoke, drink alcohol, or use illegal drugs. Some items may interact with your medicine. What should I watch for while using this medicine? This product does not protect you against HIV infection (AIDS) or other sexually transmitted diseases. You should be able to feel the implant by pressing your fingertips over the skin where it was inserted. Tell your doctor if you cannot feel the implant. What side effects may I notice from receiving this medicine? Side effects that you should report to your doctor or  health care professional as soon as possible: -allergic  reactions like skin rash, itching or hives, swelling of the face, lips, or tongue -breast lumps -changes in vision -confusion, trouble speaking or understanding -dark urine -depressed mood -general ill feeling or flu-like symptoms -light-colored stools -loss of appetite, nausea -right upper belly pain -severe headaches -severe pain, swelling, or tenderness in the abdomen -shortness of breath, chest pain, swelling in a leg -signs of pregnancy -sudden numbness or weakness of the face, arm or leg -trouble walking, dizziness, loss of balance or coordination -unusual vaginal bleeding, discharge -unusually weak or tired -yellowing of the eyes or skin Side effects that usually do not require medical attention (Report these to your doctor or health care professional if they continue or are bothersome.): -acne -breast pain -changes in weight -cough -fever or chills -headache -irregular menstrual bleeding -itching, burning, and vaginal discharge -pain or difficulty passing urine -sore throat This list may not describe all possible side effects. Call your doctor for medical advice about side effects. You may report side effects to FDA at 1-800-FDA-1088. Where should I keep my medicine? This drug is given in a hospital or clinic and will not be stored at home. NOTE: This sheet is a summary. It may not cover all possible information. If you have questions about this medicine, talk to your doctor, pharmacist, or health care provider.  2014, [employer]vier/[employer] Standard. (2011-11-01 15:37:45)

## 2013-05-24 NOTE — Progress Notes (Signed)
   Subjective:    Patient ID: Tonya Owens, female    DOB: Feb 27, 1981, 33 y.o.   MRN: [id number]  HPI  Feels somewhat depressed.  Her mother is bipolar.   Feels congested and reports loud snoring. Feels tired during the day.   Quit smoking. Got laid off.  Review of Systems  Constitutional: Negative for fever and chills.  Respiratory: Positive for shortness of breath.   Cardiovascular: Negative for chest pain.  Gastrointestinal: Negative for abdominal pain.  Genitourinary: Positive for vaginal bleeding (on menses).       Objective:   Physical Exam  Vitals reviewed. Constitutional: She appears well-developed and well-nourished.  HENT:  Head: Normocephalic and atraumatic.  Eyes: No scleral icterus.  Neck: Neck supple.  Cardiovascular: Normal rate and regular rhythm.   No murmur heard. Abdominal: Soft. There is no tenderness.  Neurological: She is alert.  Skin: Skin is warm. No rash noted.  Psychiatric: She has a normal mood and affect.          Assessment & Plan:  No evidence of bipolar Discussed contraception methods.  Unsure what she would like Advised of possible Sleep study--she will consider

## 2013-08-07 ENCOUNTER — Emergency Department (INDEPENDENT_AMBULATORY_CARE_PROVIDER_SITE_OTHER)
Admission: EM | Admit: 2013-08-07 | Discharge: 2013-08-07 | Disposition: A | Discharge status: Home / Self Care | Payer: Medicaid Other | Source: Home / Self Care | Attending: Emergency Medicine | Admitting: Emergency Medicine

## 2013-08-07 ENCOUNTER — Encounter (HOSPITAL_COMMUNITY): Payer: Self-pay | Admitting: Emergency Medicine

## 2013-08-07 DIAGNOSIS — T148 Other injury of unspecified body region: Secondary | ICD-10-CM

## 2013-08-07 DIAGNOSIS — W57XXXA Bitten or stung by nonvenomous insect and other nonvenomous arthropods, initial encounter: Secondary | ICD-10-CM

## 2013-08-07 DIAGNOSIS — L723 Sebaceous cyst: Secondary | ICD-10-CM

## 2013-08-07 MED ORDER — CEPHALEXIN 500 MG PO CAPS: 500.0000 mg | ORAL_CAPSULE | Freq: Three times a day (TID) | ORAL | Status: DC

## 2013-08-07 MED ORDER — TRIAMCINOLONE ACETONIDE 0.1 % EX CREA: 1.0000 "application " | TOPICAL_CREAM | Freq: Three times a day (TID) | CUTANEOUS | Status: DC

## 2013-08-07 NOTE — ED Notes (Signed)
Left hand pain, red area, swollen, and itching area that was noticed last night.  Unknown injury.   Second issue is small knot under left arm that has been there for a year, no change in size over that time frame.

## 2013-08-07 NOTE — ED Provider Notes (Signed)
  Chief Complaint    Chief Complaint  Patient presents with  . Hand Pain    History of Present Illness      Tonya Owens is a 33 year old female who noticed a bump on her left hand last night that was raised, red, and itchy. Today it was a little bit more swollen and aches slightly but it's not painful. She did not see anything biting her, although she did see a small bug crawling on her bed. She denies any other skin rashes. She's had no fever, chills, shortness of breath, coughing, wheezing, or swelling of the lips, tongue, or throat. Her other issue has been a small lump in her left axilla is been present for months or even years. This is nontender and not draining.  Review of Systems   Other than as noted above, the patient denies any of the following symptoms: Systemic:  No fever or chills. ENT:  No nasal congestion, rhinorrhea, sore throat, swelling of lips, tongue or throat. Resp:  No cough, wheezing, or shortness of breath.  PMFSH    Past medical history, family history, social history, meds, and allergies were reviewed.   Physical Exam     Vital signs:  BP 151/86  Pulse 109  Temp(Src) 98.6 F (37 C) (Oral)  Resp 20  SpO2 100% Gen:  Alert, oriented, in no distress. ENT:  Pharynx clear, no intraoral lesions, moist mucous membranes. Lungs:  Clear to auscultation. Skin:  On the dorsum of the left hand there was a 2 x 3 cm, raised, red area between the fourth and fifth metacarpals. This was nontender. All digits have full range of motion with no pain. In the left axilla there was an 8 mm, pea-sized, round, subcutaneous nodule which was nontender. Skin was otherwise clear.  Assessment    The primary encounter diagnosis was Insect bite. A diagnosis of Sebaceous cyst was also pertinent to this visit.  The lesion on the hand appears to be an insect bite. Differential diagnosis is spider bite, mosquito bite, bed bug bite, or flea bite. There is no evidence that this is  infected, however the patient requested a prescription for an antibiotic. The nodule in the axilla appears to be a small sebaceous cyst. This does not appear to be infected and does not require removal right now.  Plan     1.  Meds:  The following meds were prescribed:   Discharge Medication List as of 08/07/2013  9:16 PM    START taking these medications   Details  cephALEXin (KEFLEX) 500 MG capsule Take 1 capsule (500 mg total) by mouth 3 (three) times daily., Starting 08/07/2013, Until Discontinued, Normal    triamcinolone cream (KENALOG) 0.1 % Apply 1 application topically 3 (three) times daily., Starting 08/07/2013, Until Discontinued, Normal        2.  Patient Education/Counseling:  The patient was given appropriate handouts, self care instructions, and instructed in symptomatic relief.  Suggested she check her bed for bed bugs. If she finds any she'll need to call an exterminator.  3.  Follow up:  The patient was told to follow up here if no better in 3 to 4 days, or sooner if becoming worse in any way, and given some red flag symptoms such as worsening rash, fever, or difficulty breathing which would prompt immediate return.  Follow up here if necessary.      Reuben Likesavid C Sami Roes, MD 08/07/13 [phone]

## 2013-08-07 NOTE — Discharge Instructions (Signed)
Bedbugs Bedbugs are tiny bugs that live in and around beds. During the day, they hide in mattresses and other places near beds. They come out at night and bite people lying in bed. They need blood to live and grow. Bedbugs can be found in beds anywhere. Usually, they are found in places where many people come and go (hotels, shelters, hospitals). It does not matter whether the place is dirty or clean. Getting bitten by bedbugs rarely causes a medical problem. The biggest problem can be getting rid of them. This often takes the work of a Financial risk analyst. CAUSES  Less use of pesticides. Bedbugs were common before the 1950s. Then, strong pesticides such as DDT nearly wiped them out. Today, these pesticides are not used because they harm the environment and can cause health problems.  More travel. Besides mattresses, bedbugs can also live in clothing and luggage. They can come along as people travel from place to place. Bedbugs are more common in certain parts of the world. When people travel to those areas, the bugs can come home with them.  Presence of birds and bats. Bedbugs often infest birds and bats. If you have these animals in or near your home, bedbugs may infest your house, too. SYMPTOMS It does not hurt to be bitten by a bedbug. You will probably not wake up when you are bitten. Bedbugs usually bite areas of the skin that are not covered. Symptoms may show when you wake up, or they may take a day or more to show up. Symptoms may include:  Small red bumps on the skin. These might be lined up in a row or clustered in a group.  A darker red dot in the middle of red bumps.  Blisters on the skin. There may be swelling and very bad itching. These may be signs of an allergic reaction. This does not happen often. DIAGNOSIS Bedbug bites might look and feel like other types of insect bites. The bugs do not stay on the body like ticks or lice. They bite, drop off, and crawl away to hide. Your  caregiver will probably:  Ask about your symptoms.  Ask about your recent activities and travel.  Check your skin for bedbug bites.  Ask you to check at home for signs of bedbugs. You should look for:  Spots or stains on the bed or nearby. This could be from bedbugs that were crushed or from their eggs or waste.  Bedbugs themselves. They are reddish-brown, oval, and flat. They do not fly. They are about the size of an apple seed.  Places to look for bedbugs include:  Beds. Check mattresses, headboards, box springs, and bed frames.  On drapes and curtains near the bed.  Under carpeting in the bedroom.  Behind electrical outlets.  Behind any wallpaper that is peeling.  Inside luggage. TREATMENT Most bedbug bites do not need treatment. They usually go away on their own in a few days. The bites are not dangerous. However, treatment may be needed if you have scratched so much that your skin has become infected. You may also need treatment if you are allergic to bedbug bites. Treatment options include:  A drug that stops swelling and itching (corticosteroid). Usually, a cream is rubbed on the skin. If you have a bad rash, you may be given a corticosteroid pill.  Oral antihistamines. These are pills to help control itching.  Antibiotic medicines. An antibiotic may be prescribed for infected skin. HOME CARE INSTRUCTIONS  Take any medicine prescribed by your caregiver for your bites. Follow the directions carefully.  Consider wearing pajamas with long sleeves and pant legs.  Your bedroom may need to be treated. A pest control expert should make sure the bedbugs are gone. You may need to throw away mattresses or luggage. Ask the pest control expert what you can do to keep the bedbugs from coming back. Common suggestions include:  Putting a plastic cover over your mattress.  Washing and drying your clothes and bedding in hot water and a hot dryer. The temperature should be hotter  than 120 F (48.9 C). Bedbugs are killed by high temperatures.  Vacuuming carefully all around your bed. Vacuum in all cracks and crevices where the bugs might hide. Do this often.  Carefully checking all used furniture, bedding, or clothes that you bring into your house.  Eliminating bird nests and bat roosts.  If you get bedbug bites when traveling, check all your possessions carefully before bringing them into your house. If you find any bugs on clothes or in your luggage, consider throwing those items away. SEEK MEDICAL CARE IF:  You have red bug bites that keep coming back.  You have red bug bites that itch badly.  You have bug bites that cause a skin rash.  You have scratch marks that are red and sore. SEEK IMMEDIATE MEDICAL CARE IF: You have a fever. Document Released: 05/29/2010 Document Revised: 07/19/2011 Document Reviewed: 05/29/2010 [facility] Patient Information 2014 [place], [place].    Epidermal Cyst An epidermal cyst is sometimes called a sebaceous cyst, epidermal inclusion cyst, or infundibular cyst. These cysts usually contain a substance that looks "pasty" or "cheesy" and may have a bad smell. This substance is a protein called keratin. Epidermal cysts are usually found on the face, neck, or trunk. They may also occur in the vaginal area or other parts of the genitalia of both men and women. Epidermal cysts are usually small, painless, slow-growing bumps or lumps that move freely under the skin. It is important not to try to pop them. This may cause an infection and lead to tenderness and swelling. CAUSES  Epidermal cysts may be caused by a deep penetrating injury to the skin or a plugged hair follicle, often associated with acne. SYMPTOMS  Epidermal cysts can become inflamed and cause: Redness. Tenderness. Increased temperature of the skin over the bumps or lumps. Grayish-white, bad smelling material that drains from the bump or lump. DIAGNOSIS  Epidermal cysts  are easily diagnosed by your caregiver during an exam. Rarely, a tissue sample (biopsy) may be taken to rule out other conditions that may resemble epidermal cysts. TREATMENT  Epidermal cysts often get better and disappear on their own. They are rarely ever cancerous. If a cyst becomes infected, it may become inflamed and tender. This may require opening and draining the cyst. Treatment with antibiotics may be necessary. When the infection is gone, the cyst may be removed with minor surgery. Small, inflamed cysts can often be treated with antibiotics or by injecting steroid medicines. Sometimes, epidermal cysts become large and bothersome. If this happens, surgical removal in your caregiver's office may be necessary. HOME CARE INSTRUCTIONS Only take over-the-counter or prescription medicines as directed by your caregiver. Take your antibiotics as directed. Finish them even if you start to feel better. SEEK MEDICAL CARE IF:  Your cyst becomes tender, red, or swollen. Your condition is not improving or is getting worse. You have any other questions or concerns. MAKE SURE  YOU: Understand these instructions. Will watch your condition. Will get help right away if you are not doing well or get worse. Document Released: 03/27/2004 Document Revised: 07/19/2011 Document Reviewed: 11/02/2010 [facility] Patient Information 2014 [place], [place].

## 2013-09-07 ENCOUNTER — Ambulatory Visit (INDEPENDENT_AMBULATORY_CARE_PROVIDER_SITE_OTHER): Payer: Self-pay | Admitting: Family Medicine

## 2013-09-07 ENCOUNTER — Encounter: Payer: Self-pay | Admitting: Family Medicine

## 2013-09-07 VITALS — BP 140/85 | HR 82 | Temp 98.5°F | Ht 65.0 in | Wt 233.3 lb

## 2013-09-07 DIAGNOSIS — J309 Allergic rhinitis, unspecified: Secondary | ICD-10-CM | POA: Insufficient documentation

## 2013-09-07 MED ORDER — FLUTICASONE PROPIONATE 50 MCG/ACT NA SUSP: 2.0000 | Freq: Every day | NASAL | Status: DC

## 2013-09-07 MED ORDER — MONTELUKAST SODIUM 10 MG PO TABS: 10.0000 mg | ORAL_TABLET | Freq: Every day | ORAL | Status: DC

## 2013-09-07 MED ORDER — CETIRIZINE HCL 10 MG PO TABS: 10.0000 mg | ORAL_TABLET | Freq: Every day | ORAL | Status: DC

## 2013-09-07 NOTE — Assessment & Plan Note (Signed)
A: History and exam most consistent with allergic rhinitis. No red flags. No wheezing.  P: - Flonase daily - Con't Zyrtec qhs - Add Singulair qhs - Schedule PFT with Dr. Raymondo BandKoval, but may need sleep study to address her breathing concerns. - F/u with PCP to discuss this.

## 2013-09-07 NOTE — Patient Instructions (Signed)
Make an appointment with Dr. Raymondo BandKoval for your PFT.  Take Flonase every morning. Take Zyrtec and Singulair every night.  Follow up with Dr. Shawnie PonsPratt as needed.  Zarriah Starkel M. Faris Coolman, M.D.

## 2013-09-07 NOTE — Progress Notes (Signed)
Patient ID: Tonya Owens, female   DOB: 02-May-1981, 33 y.o.   MRN: [id number]    Subjective: HPI: Patient is a 33 y.o. female presenting to clinic today for same day appointment for congestion. Concerns today include congestion and needs PFT  Upper Respiratory Infection Patient complains of symptoms of a URI. Symptoms include congestion, post nasal drip and sinus pressure. Onset of symptoms was 3 days ago, and has been gradually worsening since that time. Treatment to date: antihistamines.  SOB Patient has been told by another provider she needs to get PFT to evaluate for reactive airway. She has never had an issue before. States she has intermittent wheezing.  History Reviewed: Former smoker. Health Maintenance: UTD  ROS: Please see HPI above.  Objective: Office vital signs reviewed. BP 140/85  Pulse 82  Temp(Src) 98.5 F (36.9 C) (Oral)  Ht 5\' 5"  (1.651 m)  Wt 233 lb 4.8 oz (105.824 kg)  BMI 38.82 kg/m2  LMP 08/17/2013  Physical Examination:  General: Awake, alert. NAD HEENT: Atraumatic, normocephalic. Posterior pharyngeal erythema. TM wnl b/l. Nasal mucosal edema Neck: No masses palpated. No LAD Pulm: CTAB, no wheezes Cardio: RRR, no murmurs appreciated Neuro: Grossly intact  Assessment: 33 y.o. female with allergic rhinitis  Plan: See Problem List and After Visit Summary

## 2013-09-13 ENCOUNTER — Ambulatory Visit: Payer: Medicaid Other | Admitting: Pharmacist

## 2013-09-24 ENCOUNTER — Encounter: Payer: Self-pay | Admitting: Pharmacist

## 2013-09-24 ENCOUNTER — Ambulatory Visit (INDEPENDENT_AMBULATORY_CARE_PROVIDER_SITE_OTHER): Payer: Self-pay | Admitting: Pharmacist

## 2013-09-24 VITALS — BP 159/92 | HR 98 | Ht 66.54 in | Wt 234.0 lb

## 2013-09-24 DIAGNOSIS — R0602 Shortness of breath: Secondary | ICD-10-CM

## 2013-09-24 DIAGNOSIS — I10 Essential (primary) hypertension: Secondary | ICD-10-CM

## 2013-09-24 DIAGNOSIS — Z87891 Personal history of nicotine dependence: Secondary | ICD-10-CM

## 2013-09-24 MED ORDER — AMLODIPINE BESYLATE 2.5 MG PO TABS: 2.5000 mg | ORAL_TABLET | Freq: Every day | ORAL | Status: DC

## 2013-09-24 NOTE — Assessment & Plan Note (Signed)
Spirometry evaluation with Pre Bronchodilator reveals normal lung function. No additional therapy will be initiated at this time. Treatment for allergic rhinitis (Zyrtec, Singular, and Flonase) will be continued.  Patient was also seen for obesity.  She has committed to joining a gym and working out 3-4 times per week in order to lose weight and improve breathing.  She was also counseled on adding more vegetables to her diet and eating smaller portions.

## 2013-09-24 NOTE — Assessment & Plan Note (Signed)
Patient was newly diagnosed with stage I hypertension and is willing to start blood pressure medication. Started on amlodipine 2.5 mg daily. Counseled on side effects of peripheral edema.  Patient was also seen for obesity.  She has committed to joining a gym and working out 3-4 times per week in order to lose weight and improve breathing.  She was also counseled on adding more vegetables to her diet and eating smaller portions.

## 2013-09-24 NOTE — Progress Notes (Signed)
Patient ID: Tonya Owens, female   DOB: 1981/03/09, 33 y.o.   MRN: [id number] Reviewed: Agree with Dr. Macky LowerKoval's documentation and management.

## 2013-09-24 NOTE — Addendum Note (Signed)
Addended by: Kathrin RuddyKOVAL, Hulen Mandler G on: 09/24/2013 12:31 PM   Modules accepted: Orders

## 2013-09-24 NOTE — Progress Notes (Signed)
S:    Patient arrives good spirits.    Presents for lung function evaluation.  Patient reports breathing has improved by using Zyrtec, Flonase and Singulair for allergies.    O:  See "scanned report" or Documentation Flowsheet (discrete results - PFTs) for  Spirometry results. Patient provided good effort while attempting spirometry.   Wt: 234 lbs BP: 159/92    BMI: 37.2   A/P:  Spirometry evaluation with Pre Bronchodilator reveals normal lung function. No additional therapy will be initiated at this time. Treatment for allergic rhinitis (Zyrtec, Singular, and Flonase) will be continued.  Patient was newly diagnosed with stage I hypertension and is willing to start blood pressure medication. Started on amlodipine 2.5 mg daily. Counseled on side effects of peripheral edema.  Patient was also seen for obesity.  She has committed to joining a gym and working out 3-4 times per week in order to lose weight and improve breathing.  She was also counseled on adding more vegetables to her diet and eating smaller portions.   Pt verbalized understanding of results and education.  Written pt instructions provided.  F/U Clinic visit in the next few months with Dr. Shawnie PonsPratt.   Total time in face to face counseling 30 minutes.  Patient seen with Jamse ArnKatie Felt, Pharmacy Resident Joyice FasterJonathan Binz, Pharmacy Resident Sherie DonMichael Rollins, PharmD Candidate.

## 2013-09-24 NOTE — Assessment & Plan Note (Signed)
Encouraged continued abstinence.   Weight gain of > 10 pounds post cessation of tobacco in 2013 is concerning.   Patient willing to work on weight loss post tobacco cessation.

## 2013-09-24 NOTE — Patient Instructions (Signed)
Thanks for coming to your appointment!  Your breathing tests were normal today.  Continue to take your Zyrtec, Singulair, and Flonase for allergies.  Take the Zyrtec at night if it is making you sleepy.  Try to start working out 3 times a week to help with weight loss.  Eat more vegetables in your diet and try to eat smaller portions.

## 2013-09-26 ENCOUNTER — Encounter (HOSPITAL_COMMUNITY): Payer: Self-pay | Admitting: Emergency Medicine

## 2013-09-26 ENCOUNTER — Telehealth: Payer: Self-pay | Admitting: *Deleted

## 2013-09-26 ENCOUNTER — Emergency Department (HOSPITAL_COMMUNITY)
Admission: EM | Admit: 2013-09-26 | Discharge: 2013-09-26 | Disposition: A | Discharge status: Home / Self Care | Payer: Medicaid Other | Source: Home / Self Care | Attending: Family Medicine | Admitting: Family Medicine

## 2013-09-26 DIAGNOSIS — I10 Essential (primary) hypertension: Secondary | ICD-10-CM

## 2013-09-26 DIAGNOSIS — N898 Other specified noninflammatory disorders of vagina: Secondary | ICD-10-CM

## 2013-09-26 MED ORDER — FLUCONAZOLE 150 MG PO TABS: 150.0000 mg | ORAL_TABLET | Freq: Once | ORAL | Status: DC

## 2013-09-26 NOTE — ED Provider Notes (Signed)
CSN: [id number]     Arrival date & time 09/26/13  1558 History   First MD Initiated Contact with Patient 09/26/13 1657     Chief Complaint  Patient presents with  . Hypertension   (Consider location/radiation/quality/duration/timing/severity/associated sxs/prior Treatment) HPI Comments: Patient presents for reassurance. Was recently started on blood pressure medication (amlodipine) and states that she has been checking her blood pressure at home and now finds it to be normal and she questions if she needs to continue taking blood pressure medication because "blood pressure is better." Also wishes to report that she has chosen to discontinue taking her oral contraceptive medication as of yesterday because she feels that the medication is contributing to her weight gain over the past year. 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   Patient is a 33 y.o. female presenting with hypertension. The history is provided by the patient.  Hypertension    History reviewed. No pertinent past medical history. History reviewed. No pertinent past surgical history. Family History  Problem Relation Age of Onset  . Hypertension Mother   . Diabetes Father   . Diabetes Maternal Grandmother    History  Substance Use Topics  . Smoking status: Former Smoker -- 0.50 packs/day for 13 years    Types: Cigarettes    Start date: 07/24/1996    Quit date: 06/10/2012  . Smokeless tobacco: Never Used  . Alcohol Use: No   OB History   Grav Para Term Preterm Abortions TAB SAB Ect Mult Living   3 2 2  0 1 0 0 1 0 1     Review of Systems  Allergies  Omeprazole  Home Medications   Prior to Admission medications   Medication Sig Start Date End Date Taking? Authorizing Provider  amLODipine (NORVASC) 2.5 MG tablet Take 1 tablet (2.5 mg total) by mouth daily.  09/24/13  Yes Sanjuana LettersWilliam Arthur Hensel, MD  cetirizine (ZYRTEC) 10 MG tablet Take 1 tablet (10 mg total) by mouth at bedtime. 09/07/13   Amber Nydia BoutonM Hairford, MD  fluconazole (DIFLUCAN) 150 MG tablet Take 1 tablet (150 mg total) by mouth once. 09/26/13   Adam PhenixJames G Arnold, MD  fluticasone (FLONASE) 50 MCG/ACT nasal spray Place 2 sprays into both nostrils daily. 09/07/13   Amber Nydia BoutonM Hairford, MD  montelukast (SINGULAIR) 10 MG tablet Take 1 tablet (10 mg total) by mouth at bedtime. 09/07/13   Amber Nydia BoutonM Hairford, MD  triamcinolone cream (KENALOG) 0.1 % Apply 1 application topically 3 (three) times daily. 08/07/13   Reuben Likesavid C Keller, MD   BP 139/84  Pulse 109  Temp(Src) 98.3 F (36.8 C) (Oral)  Resp 18  SpO2 100%  LMP 08/17/2013 Physical Exam  Nursing note and vitals reviewed. Constitutional: She is oriented to person, place, and time. She appears well-developed and well-nourished. No distress.  HENT:  Head: Normocephalic and atraumatic.  Eyes: Conjunctivae are normal. No scleral icterus.  Cardiovascular: Normal rate.   Pulmonary/Chest: Effort normal.  Musculoskeletal: Normal range of motion. She exhibits no edema and no tenderness.  Neurological: She is alert and oriented to person, place, and time.  Skin: Skin is warm and dry.  Psychiatric: She has a normal mood and affect. Her behavior is normal.    ED Course  Procedures (including critical care time) Labs Review Labs Reviewed - No data to display  Imaging Review No  results found.   MDM   1. Essential hypertension, benign    Patient has quit smoking and plans to begin weight loss program and I encouraged her to continue with these plans. 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 Educated patient regarding the need to continue her blood pressure medication as it has successfully lowered her blood pressure and BP will elevate again if medication discontinued. Advised her that if  she is successful with weight loss. She may then be able to discuss BP medication discontinuation with her PCP.   Jess BartersJennifer Lee BenedictPresson, [place] 09/26/13 1835  Jess BartersJennifer Lee ByersPresson, [place] 09/26/13 [phone]

## 2013-09-26 NOTE — ED Notes (Signed)
Pt c/o numbness/tingly of extremities since taking amlodipine yest for HTN Tingly sensation is intermittent Denies SOB, CP, weakness Alert w/no signs of acute distress.

## 2013-09-26 NOTE — Telephone Encounter (Signed)
Tonya Owens left a message stating she is calling re: her birth control. Not sure if it causing her to have vaginal itching - I think I have yeast infection.  States was told we have no appointments until June.  Wants to know if we can prescribe her something for until she is seen.   Per chart has been seen 09/2012 in our office, [facility].    Called [name] , left message we are returning her call, will send med to her pharmacy,  Call if it doesn't help or questions.

## 2013-09-26 NOTE — Discharge Instructions (Signed)
DASH Diet The DASH diet stands for "Dietary Approaches to Stop Hypertension." It is a healthy eating plan that has been shown to reduce high blood pressure (hypertension) in as little as 14 days, while also possibly providing other significant health benefits. These other health benefits include reducing the risk of breast cancer after menopause and reducing the risk of type 2 diabetes, heart disease, colon cancer, and stroke. Health benefits also include weight loss and slowing kidney failure in patients with chronic kidney disease.  DIET GUIDELINES  Limit salt (sodium). Your diet should contain less than 1500 mg of sodium daily.  Limit refined or processed carbohydrates. Your diet should include mostly whole grains. Desserts and added sugars should be used sparingly.  Include small amounts of heart-healthy fats. These types of fats include nuts, oils, and tub margarine. Limit saturated and trans fats. These fats have been shown to be harmful in the body. CHOOSING FOODS  The following food groups are based on a 2000 calorie diet. See your Registered Dietitian for individual calorie needs. Grains and Grain Products (6 to 8 servings daily)  Eat More Often: Whole-wheat bread, brown rice, whole-grain or wheat pasta, quinoa, popcorn without added fat or salt (air popped).  Eat Less Often: White bread, white pasta, white rice, cornbread. Vegetables (4 to 5 servings daily)  Eat More Often: Fresh, frozen, and canned vegetables. Vegetables may be raw, steamed, roasted, or grilled with a minimal amount of fat.  Eat Less Often/Avoid: Creamed or fried vegetables. Vegetables in a cheese sauce. Fruit (4 to 5 servings daily)  Eat More Often: All fresh, canned (in natural juice), or frozen fruits. Dried fruits without added sugar. One hundred percent fruit juice ( cup [237 mL] daily).  Eat Less Often: Dried fruits with added sugar. Canned fruit in light or heavy syrup. [employer], Fish, and Poultry (2  servings or less daily. One serving is 3 to 4 oz [85-114 g]).  Eat More Often: Ninety percent or leaner ground beef, tenderloin, sirloin. Round cuts of beef, chicken breast, [place] breast. All fish. Grill, bake, or broil your meat. Nothing should be fried.  Eat Less Often/Avoid: Fatty cuts of meat, [place], or chicken leg, thigh, or wing. Fried cuts of meat or fish. Dairy (2 to 3 servings)  Eat More Often: Low-fat or fat-free milk, low-fat plain or light yogurt, reduced-fat or part-skim cheese.  Eat Less Often/Avoid: Milk (whole, 2%).Whole milk yogurt. Full-fat cheeses. Nuts, Seeds, and Legumes (4 to 5 servings per week)  Eat More Often: All without added salt.  Eat Less Often/Avoid: Salted nuts and seeds, canned beans with added salt. Fats and Sweets (limited)  Eat More Often: Vegetable oils, tub margarines without trans fats, sugar-free gelatin. Mayonnaise and salad dressings.  Eat Less Often/Avoid: Coconut oils, palm oils, butter, stick margarine, cream, half and half, cookies, candy, pie. FOR MORE INFORMATION The Dash Diet Eating Plan: www.dashdiet.org Document Released: 04/15/2011 Document Revised: 07/19/2011 Document Reviewed: 04/15/2011 [facility] Patient Information 2014 [place], [place].  Hypertension As your heart beats, it forces blood through your arteries. This force is your blood pressure. If the pressure is too high, it is called hypertension (HTN) or high blood pressure. HTN is dangerous because you may have it and not know it. High blood pressure may mean that your heart has to work harder to pump blood. Your arteries may be narrow or stiff. The extra work puts you at risk for heart disease, stroke, and other problems.  Blood pressure consists of two numbers,  a higher number over a lower, 110/72, for example. It is stated as "110 over 72." The ideal is below 120 for the top number (systolic) and under 80 for the bottom (diastolic). Write down your blood pressure today. You  should pay close attention to your blood pressure if you have certain conditions such as:  Heart failure.  Prior heart attack.  Diabetes  Chronic kidney disease.  Prior stroke.  Multiple risk factors for heart disease. To see if you have HTN, your blood pressure should be measured while you are seated with your arm held at the level of the heart. It should be measured at least twice. A one-time elevated blood pressure reading (especially in the Emergency Department) does not mean that you need treatment. There may be conditions in which the blood pressure is different between your right and left arms. It is important to see your caregiver soon for a recheck. Most people have essential hypertension which means that there is not a specific cause. This type of high blood pressure may be lowered by changing lifestyle factors such as:  Stress.  Smoking.  Lack of exercise.  Excessive weight.  Drug/tobacco/alcohol use.  Eating less salt. Most people do not have symptoms from high blood pressure until it has caused damage to the body. Effective treatment can often prevent, delay or reduce that damage. TREATMENT  When a cause has been identified, treatment for high blood pressure is directed at the cause. There are a large number of medications to treat HTN. These fall into several categories, and your caregiver will help you select the medicines that are best for you. Medications may have side effects. You should review side effects with your caregiver. If your blood pressure stays high after you have made lifestyle changes or started on medicines,   Your medication(s) may need to be changed.  Other problems may need to be addressed.  Be certain you understand your prescriptions, and know how and when to take your medicine.  Be sure to follow up with your caregiver within the time frame advised (usually within two weeks) to have your blood pressure rechecked and to review your  medications.  If you are taking more than one medicine to lower your blood pressure, make sure you know how and at what times they should be taken. Taking two medicines at the same time can result in blood pressure that is too low. SEEK IMMEDIATE MEDICAL CARE IF:  You develop a severe headache, blurred or changing vision, or confusion.  You have unusual weakness or numbness, or a faint feeling.  You have severe chest or abdominal pain, vomiting, or breathing problems. MAKE SURE YOU:   Understand these instructions.  Will watch your condition.  Will get help right away if you are not doing well or get worse. Document Released: 04/26/2005 Document Revised: 07/19/2011 Document Reviewed: 12/15/2007 [facility] Patient Information 2014 [place], [place].  Managing Your High Blood Pressure Blood pressure is a measurement of how forceful your blood is pressing against the walls of the arteries. Arteries are muscular tubes within the circulatory system. Blood pressure does not stay the same. Blood pressure rises when you are active, excited, or nervous; and it lowers during sleep and relaxation. If the numbers measuring your blood pressure stay above normal most of the time, you are at risk for health problems. High blood pressure (hypertension) is a long-term (chronic) condition in which blood pressure is elevated. A blood pressure reading is recorded as two numbers, such  as 120 over 80 (or 120/80). The first, higher number is called the systolic pressure. It is a measure of the pressure in your arteries as the heart beats. The second, lower number is called the diastolic pressure. It is a measure of the pressure in your arteries as the heart relaxes between beats.  Keeping your blood pressure in a normal range is important to your overall health and prevention of health problems, such as heart disease and stroke. When your blood pressure is uncontrolled, your heart has to work harder than normal. High  blood pressure is a very common condition in adults because blood pressure tends to rise with age. Men and women are equally likely to have hypertension but at different times in life. Before age 77, men are more likely to have hypertension. After 33 years of age, women are more likely to have it. Hypertension is especially common in African Americans. This condition often has no signs or symptoms. The cause of the condition is usually not known. Your caregiver can help you come up with a plan to keep your blood pressure in a normal, healthy range. BLOOD PRESSURE STAGES Blood pressure is classified into four stages: normal, prehypertension, stage 1, and stage 2. Your blood pressure reading will be used to determine what type of treatment, if any, is necessary. Appropriate treatment options are tied to these four stages:  Normal  Systolic pressure (mm Hg): below 120.  Diastolic pressure (mm Hg): below 80. Prehypertension  Systolic pressure (mm Hg): 120 to 139.  Diastolic pressure (mm Hg): 80 to 89. Stage1  Systolic pressure (mm Hg): 140 to 159.  Diastolic pressure (mm Hg): 90 to 99. Stage2  Systolic pressure (mm Hg): 160 or above.  Diastolic pressure (mm Hg): 100 or above. RISKS RELATED TO HIGH BLOOD PRESSURE Managing your blood pressure is an important responsibility. Uncontrolled high blood pressure can lead to:  A heart attack.  A stroke.  A weakened blood vessel (aneurysm).  Heart failure.  Kidney damage.  Eye damage.  Metabolic syndrome.  Memory and concentration problems. HOW TO MANAGE YOUR BLOOD PRESSURE Blood pressure can be managed effectively with lifestyle changes and medicines (if needed). Your caregiver will help you come up with a plan to bring your blood pressure within a normal range. Your plan should include the following: Education  Read all information provided by your caregivers about how to control blood pressure.  Educate yourself on the latest  guidelines and treatment recommendations. New research is always being done to further define the risks and treatments for high blood pressure. Lifestylechanges  Control your weight.  Avoid smoking.  Stay physically active.  Reduce the amount of salt in your diet.  Reduce stress.  Control any chronic conditions, such as high cholesterol or diabetes.  Reduce your alcohol intake. Medicines  Several medicines (antihypertensive medicines) are available, if needed, to bring blood pressure within a normal range. Communication  Review all the medicines you take with your caregiver because there may be side effects or interactions.  Talk with your caregiver about your diet, exercise habits, and other lifestyle factors that may be contributing to high blood pressure.  See your caregiver regularly. Your caregiver can help you create and adjust your plan for managing high blood pressure. RECOMMENDATIONS FOR TREATMENT AND FOLLOW-UP  The following recommendations are based on current guidelines for managing high blood pressure in nonpregnant adults. Use these recommendations to identify the proper follow-up period or treatment option based on your blood pressure  reading. You can discuss these options with your caregiver.  Systolic pressure of 120 to 139 or diastolic pressure of 80 to 89: Follow up with your caregiver as directed.  Systolic pressure of 140 to 160 or diastolic pressure of 90 to 100: Follow up with your caregiver within 2 months.  Systolic pressure above 160 or diastolic pressure above 100: Follow up with your caregiver within 1 month.  Systolic pressure above 180 or diastolic pressure above 110: Consider antihypertensive therapy; follow up with your caregiver within 1 week.  Systolic pressure above 200 or diastolic pressure above 120: Begin antihypertensive therapy; follow up with your caregiver within 1 week. Document Released: 01/19/2012 Document Reviewed:  01/19/2012 [facility] Patient Information 2014 [place], [place].

## 2013-09-27 NOTE — ED Provider Notes (Signed)
Medical screening examination/treatment/procedure(s) were performed by a resident physician or non-physician practitioner and as the supervising physician I was immediately available for consultation/collaboration.  Evan Corey, MD    Evan S Corey, MD 09/27/13 0748 

## 2013-10-29 ENCOUNTER — Encounter: Payer: Self-pay | Admitting: *Deleted

## 2013-10-29 ENCOUNTER — Ambulatory Visit: Payer: Medicaid Other | Admitting: Family Medicine

## 2013-10-29 NOTE — Progress Notes (Signed)
Patient showed up late for her office visit today. Patient not seen. She was advised to call nurse line regarding her yeast symptoms and that she can followup with her PCP as well for her concerns.

## 2013-11-07 ENCOUNTER — Ambulatory Visit (INDEPENDENT_AMBULATORY_CARE_PROVIDER_SITE_OTHER): Payer: Self-pay | Admitting: Family Medicine

## 2013-11-07 ENCOUNTER — Encounter: Payer: Self-pay | Admitting: Family Medicine

## 2013-11-07 VITALS — BP 141/81 | HR 103 | Temp 99.1°F | Ht 65.0 in | Wt 226.0 lb

## 2013-11-07 DIAGNOSIS — R109 Unspecified abdominal pain: Secondary | ICD-10-CM

## 2013-11-07 DIAGNOSIS — I10 Essential (primary) hypertension: Secondary | ICD-10-CM

## 2013-11-07 DIAGNOSIS — R1031 Right lower quadrant pain: Secondary | ICD-10-CM

## 2013-11-07 LAB — POCT UA - MICROSCOPIC ONLY

## 2013-11-07 LAB — POCT URINALYSIS DIPSTICK
BILIRUBIN UA: NEGATIVE
GLUCOSE UA: NEGATIVE
KETONES UA: NEGATIVE
Nitrite, UA: NEGATIVE
RBC UA: NEGATIVE
Spec Grav, UA: 1.025
Urobilinogen, UA: 0.2
pH, UA: 6

## 2013-11-07 MED ORDER — METRONIDAZOLE 500 MG PO TABS: 2000.0000 mg | ORAL_TABLET | Freq: Once | ORAL | Status: DC

## 2013-11-07 MED ORDER — CEPHALEXIN 500 MG PO CAPS: 500.0000 mg | ORAL_CAPSULE | Freq: Three times a day (TID) | ORAL | Status: DC

## 2013-11-07 MED ORDER — HYDROCHLOROTHIAZIDE 12.5 MG PO CAPS: 12.5000 mg | ORAL_CAPSULE | Freq: Every day | ORAL | Status: DC

## 2013-11-07 NOTE — Patient Instructions (Signed)
It was nice to see you today.  Below is what we discussed and the plan:  1) Abdominal pain - Your urine was suggestive of infection - I am treating you with an antibiotic. Please take 3 times a day for 5 days.  2) High BP - I have switched you to hydrochlorothiazide - Please take daily (preferably in the am)  Follow up with your PCP as indicated.

## 2013-11-07 NOTE — Progress Notes (Signed)
   Subjective:    Patient ID: Tonya Owens, female    DOB: 1980/10/26, 33 y.o.   MRN: [id number]  HPI 33 year old female presents for same day appointment with complaints of abdominal pain.  Patient also would like to discuss HTN management.  1) Abdominal pain - Patient reports that yesterday she developed right lower quadrant pain/suprapubic pain - Pain was severe but quickly resolved. She reports the pain recurred yesterday evening but once again resolved fairly quickly. - No associated nausea, vomiting, fevers, chills. - She does report recent dysuria, and urinary urgency/frequency. - She denies any current pain.  2) HTN Disease Monitoring: Home BP Monitoring - None Medications: Amlodipine (no longer taking as it made her feel "jittery") ROS: Denies chest pain, SOB, lightheadedness/dizziness  Review of Systems Per HPI    Objective:   Physical Exam Filed Vitals:   11/07/13 1141  BP: 141/81  Pulse: 103  Temp: 99.1 F (37.3 C)   Exam: General: well appearing, NAD. Cardiovascular: RRR. No murmurs, rubs, or gallops. Respiratory: CTAB. No rales, rhonchi, or wheeze. Abdomen: obese, soft, nontender, nondistended. No rebound or guarding. No palpable organomegaly.    Assessment & Plan:  See Problem List

## 2013-11-07 NOTE — Assessment & Plan Note (Signed)
Norvasc discontinued.  Starting HCTZ.

## 2013-11-07 NOTE — Assessment & Plan Note (Signed)
Abdominal exam benign. Given dysuria and urinary frequency/urgency urinalysis was obtained.  UA revealed moderate leukocytes.  Given symptomatology will treat empirically for UTI while awaiting culture results.  Rx sent for Keflex 500 3 times a day x5 days.  **Urinalysis also revealed Trichomonas.  Will treat empirically with Flagyl.**

## 2013-11-09 LAB — URINE CULTURE: Colony Count: 30000

## 2014-01-30 ENCOUNTER — Ambulatory Visit: Payer: Medicaid Other | Admitting: Family Medicine

## 2014-02-20 ENCOUNTER — Other Ambulatory Visit (HOSPITAL_COMMUNITY)
Admission: RE | Admit: 2014-02-20 | Discharge: 2014-02-20 | Disposition: A | Discharge status: Home / Self Care | Payer: Medicaid Other | Source: Ambulatory Visit | Attending: Family Medicine | Admitting: Family Medicine

## 2014-02-20 ENCOUNTER — Encounter: Payer: Self-pay | Admitting: Family Medicine

## 2014-02-20 ENCOUNTER — Ambulatory Visit (INDEPENDENT_AMBULATORY_CARE_PROVIDER_SITE_OTHER): Payer: Self-pay | Admitting: Family Medicine

## 2014-02-20 VITALS — BP 130/83 | HR 114 | Temp 99.8°F | Wt 228.0 lb

## 2014-02-20 DIAGNOSIS — Z113 Encounter for screening for infections with a predominantly sexual mode of transmission: Secondary | ICD-10-CM | POA: Insufficient documentation

## 2014-02-20 DIAGNOSIS — Z3009 Encounter for other general counseling and advice on contraception: Secondary | ICD-10-CM

## 2014-02-20 DIAGNOSIS — I10 Essential (primary) hypertension: Secondary | ICD-10-CM

## 2014-02-20 DIAGNOSIS — E669 Obesity, unspecified: Secondary | ICD-10-CM

## 2014-02-20 DIAGNOSIS — Z0000 Encounter for general adult medical examination without abnormal findings: Secondary | ICD-10-CM

## 2014-02-20 LAB — CBC
HCT: 35.6 % — ABNORMAL LOW (ref 36.0–46.0)
Hemoglobin: 12.5 g/dL (ref 12.0–15.0)
MCH: 26.4 pg (ref 26.0–34.0)
MCHC: 35.1 g/dL (ref 30.0–36.0)
MCV: 75.3 fL — ABNORMAL LOW (ref 78.0–100.0)
PLATELETS: 218 10*3/uL (ref 150–400)
RBC: 4.73 MIL/uL (ref 3.87–5.11)
RDW: 14.1 % (ref 11.5–15.5)
WBC: 6.2 10*3/uL (ref 4.0–10.5)

## 2014-02-20 NOTE — Progress Notes (Signed)
    Subjective:    Patient ID: Tonya Owens is a 33 y.o. female presenting with Hypertension  on 02/20/2014  HPI: Pt. Is 2 years s/p stopping smoking. Working on weight loss. Still feels some SOB.--had normal lung function on spirometry.  Notes SOB at rest. Reports walking 3-4x/wk. Walks about 30 mins. Reports not taking any BP meds.  Needs update on labs.   Review of Systems  Constitutional: Negative for fever and chills.  Respiratory: Positive for shortness of breath. Negative for chest tightness.   Cardiovascular: Negative for chest pain and leg swelling.  Gastrointestinal: Positive for abdominal pain.      Objective:    BP 130/83  Pulse 114  Temp(Src) 99.8 F (37.7 C) (Oral)  Wt 228 lb (103.42 kg)  LMP 01/24/2014 Physical Exam  Constitutional: She appears well-developed and well-nourished.  Neck: Neck supple.  Cardiovascular: Normal rate.   No murmur heard. Pulmonary/Chest: Effort normal.  Abdominal: Soft. There is no tenderness.  Neurological: She is alert.  Skin: Skin is warm. No rash noted.  Psychiatric: She has a normal mood and affect.        Assessment & Plan:    Problem List Items Addressed This Visit     Unprioritized   Obesity     Continue to work on weight loss.    Essential hypertension, benign - Primary     Has had numerous side effects with current regimen--and she does not believe she has HTN at this point.  Will continue to have f/u outpatient BP's and hold meds for now.    Relevant Orders      Lipid panel      Comprehensive metabolic panel    Other Visit Diagnoses   Routine medical exam        Relevant Orders       TSH       Lipid panel       CBC       Comprehensive metabolic panel    Screen for STD (sexually transmitted disease)        Relevant Orders       HIV antibody       RPR       Urine cytology ancillary only    Encounter for other general counseling or advice on contraception         Will consider IUD and return  for this if interested

## 2014-02-20 NOTE — Assessment & Plan Note (Signed)
Continue to work on weight loss 

## 2014-02-20 NOTE — Assessment & Plan Note (Signed)
Has had numerous side effects with current regimen--and she does not believe she has HTN at this point.  Will continue to have f/u outpatient BP's and hold meds for now.

## 2014-02-20 NOTE — Patient Instructions (Addendum)
Intrauterine Device Information An intrauterine device (IUD) is inserted into your uterus to prevent pregnancy. There are two types of IUDs available:   Copper IUD--This type of IUD is wrapped in copper wire and is placed inside the uterus. Copper makes the uterus and fallopian tubes produce a fluid that kills sperm. The copper IUD can stay in place for 10 years.  Hormone IUD--This type of IUD contains the hormone progestin (synthetic progesterone). The hormone thickens the cervical mucus and prevents sperm from entering the uterus. It also thins the uterine lining to prevent implantation of a fertilized egg. The hormone can weaken or kill the sperm that get into the uterus. One type of hormone IUD can stay in place for 5 years, and another type can stay in place for 3 years. Your health care provider will make sure you are a good candidate for a contraceptive IUD. Discuss with your health care provider the possible side effects.  ADVANTAGES OF AN INTRAUTERINE DEVICE  IUDs are highly effective, reversible, long acting, and low maintenance.   There are no estrogen-related side effects.   An IUD can be used when breastfeeding.   IUDs are not associated with weight gain.   The copper IUD works immediately after insertion.   The hormone IUD works right away if inserted within 7 days of your period starting. You will need to use a backup method of birth control for 7 days if the hormone IUD is inserted at any other time in your cycle.  The copper IUD does not interfere with your female hormones.   The hormone IUD can make heavy menstrual periods lighter and decrease cramping.   The hormone IUD can be used for 3 or 5 years.   The copper IUD can be used for 10 years. DISADVANTAGES OF AN INTRAUTERINE DEVICE  The hormone IUD can be associated with irregular bleeding patterns.   The copper IUD can make your menstrual flow heavier and more painful.   You may experience cramping and  vaginal bleeding after insertion.  Document Released: 03/30/2004 Document Revised: 12/27/2012 Document Reviewed: 10/15/2012 [facility] Patient Information 2015 [place], [place]. This information is not intended to replace advice given to you by your health care provider. Make sure you discuss any questions you have with your health care provider. Preventive Care for Adults A healthy lifestyle and preventive care can promote health and wellness. Preventive health guidelines for women include the following key practices.  A routine yearly physical is a good way to check with your health care provider about your health and preventive screening. It is a chance to share any concerns and updates on your health and to receive a thorough exam.  Visit your dentist for a routine exam and preventive care every 6 months. Brush your teeth twice a day and floss once a day. Good oral hygiene prevents tooth decay and gum disease.  The frequency of eye exams is based on your age, health, family medical history, use of contact lenses, and other factors. Follow your health care provider's recommendations for frequency of eye exams.  Eat a healthy diet. Foods like vegetables, fruits, whole grains, low-fat dairy products, and lean protein foods contain the nutrients you need without too many calories. Decrease your intake of foods high in solid fats, added sugars, and salt. Eat the right amount of calories for you.Get information about a proper diet from your health care provider, if necessary.  Regular physical exercise is one of the most important things you can  do for your health. Most adults should get at least 150 minutes of moderate-intensity exercise (any activity that increases your heart rate and causes you to sweat) each week. In addition, most adults need muscle-strengthening exercises on 2 or more days a week.  Maintain a healthy weight. The body mass index (BMI) is a screening tool to identify possible weight  problems. It provides an estimate of body fat based on height and weight. Your health care provider can find your BMI and can help you achieve or maintain a healthy weight.For adults 20 years and older:  A BMI below 18.5 is considered underweight.  A BMI of 18.5 to 24.9 is normal.  A BMI of 25 to 29.9 is considered overweight.  A BMI of 30 and above is considered obese.  Maintain normal blood lipids and cholesterol levels by exercising and minimizing your intake of saturated fat. Eat a balanced diet with plenty of fruit and vegetables. Blood tests for lipids and cholesterol should begin at age 74 and be repeated every 5 years. If your lipid or cholesterol levels are high, you are over 50, or you are at high risk for heart disease, you may need your cholesterol levels checked more frequently.Ongoing high lipid and cholesterol levels should be treated with medicines if diet and exercise are not working.  If you smoke, find out from your health care provider how to quit. If you do not use tobacco, do not start.  Lung cancer screening is recommended for adults aged 34-80 years who are at high risk for developing lung cancer because of a history of smoking. A yearly low-dose CT scan of the lungs is recommended for people who have at least a 30-pack-year history of smoking and are a current smoker or have quit within the past 15 years. A pack year of smoking is smoking an average of 1 pack of cigarettes a day for 1 year (for example: 1 pack a day for 30 years or 2 packs a day for 15 years). Yearly screening should continue until the smoker has stopped smoking for at least 15 years. Yearly screening should be stopped for people who develop a health problem that would prevent them from having lung cancer treatment.  If you are pregnant, do not drink alcohol. If you are breastfeeding, be very cautious about drinking alcohol. If you are not pregnant and choose to drink alcohol, do not have more than 1 drink  per day. One drink is considered to be 12 ounces (355 mL) of beer, 5 ounces (148 mL) of wine, or 1.5 ounces (44 mL) of liquor.  Avoid use of street drugs. Do not share needles with anyone. Ask for help if you need support or instructions about stopping the use of drugs.  High blood pressure causes heart disease and increases the risk of stroke. Your blood pressure should be checked at least every 1 to 2 years. Ongoing high blood pressure should be treated with medicines if weight loss and exercise do not work.  If you are 41-77 years old, ask your health care provider if you should take aspirin to prevent strokes.  Diabetes screening involves taking a blood sample to check your fasting blood sugar level. This should be done once every 3 years, after age 39, if you are within normal weight and without risk factors for diabetes. Testing should be considered at a younger age or be carried out more frequently if you are overweight and have at least 1 risk factor for diabetes.  Breast cancer screening is essential preventive care for women. You should practice "breast self-awareness." This means understanding the normal appearance and feel of your breasts and may include breast self-examination. Any changes detected, no matter how small, should be reported to a health care provider. Women in their 36s and 30s should have a clinical breast exam (CBE) by a health care provider as part of a regular health exam every 1 to 3 years. After age 90, women should have a CBE every year. Starting at age 77, women should consider having a mammogram (breast X-ray test) every year. Women who have a family history of breast cancer should talk to their health care provider about genetic screening. Women at a high risk of breast cancer should talk to their health care providers about having an MRI and a mammogram every year.  Breast cancer gene (BRCA)-related cancer risk assessment is recommended for women who have family  members with BRCA-related cancers. BRCA-related cancers include breast, ovarian, tubal, and peritoneal cancers. Having family members with these cancers may be associated with an increased risk for harmful changes (mutations) in the breast cancer genes BRCA1 and BRCA2. Results of the assessment will determine the need for genetic counseling and BRCA1 and BRCA2 testing.  Routine pelvic exams to screen for cancer are no longer recommended for nonpregnant women who are considered low risk for cancer of the pelvic organs (ovaries, uterus, and vagina) and who do not have symptoms. Ask your health care provider if a screening pelvic exam is right for you.  If you have had past treatment for cervical cancer or a condition that could lead to cancer, you need Pap tests and screening for cancer for at least 20 years after your treatment. If Pap tests have been discontinued, your risk factors (such as having a new sexual partner) need to be reassessed to determine if screening should be resumed. Some women have medical problems that increase the chance of getting cervical cancer. In these cases, your health care provider may recommend more frequent screening and Pap tests.  The HPV test is an additional test that may be used for cervical cancer screening. The HPV test looks for the virus that can cause the cell changes on the cervix. The cells collected during the Pap test can be tested for HPV. The HPV test could be used to screen women aged 71 years and older, and should be used in women of any age who have unclear Pap test results. After the age of 30, women should have HPV testing at the same frequency as a Pap test.  Colorectal cancer can be detected and often prevented. Most routine colorectal cancer screening begins at the age of 35 years and continues through age 41 years. However, your health care provider may recommend screening at an earlier age if you have risk factors for colon cancer. On a yearly basis,  your health care provider may provide home test kits to check for hidden blood in the stool. Use of a small camera at the end of a tube, to directly examine the colon (sigmoidoscopy or colonoscopy), can detect the earliest forms of colorectal cancer. Talk to your health care provider about this at age 57, when routine screening begins. Direct exam of the colon should be repeated every 5-10 years through age 27 years, unless early forms of pre-cancerous polyps or small growths are found.  People who are at an increased risk for hepatitis B should be screened for this virus. You are considered  at high risk for hepatitis B if:  You were born in a country where hepatitis B occurs often. Talk with your health care provider about which countries are considered high risk.  Your parents were born in a high-risk country and you have not received a shot to protect against hepatitis B (hepatitis B vaccine).  You have HIV or AIDS.  You use needles to inject street drugs.  You live with, or have sex with, someone who has hepatitis B.  You get hemodialysis treatment.  You take certain medicines for conditions like cancer, organ transplantation, and autoimmune conditions.  Hepatitis C blood testing is recommended for all people born from [birth year] through 1965 and any individual with known risks for hepatitis C.  Practice safe sex. Use condoms and avoid high-risk sexual practices to reduce the spread of sexually transmitted infections (STIs). STIs include gonorrhea, chlamydia, syphilis, trichomonas, herpes, HPV, and human immunodeficiency virus (HIV). Herpes, HIV, and HPV are viral illnesses that have no cure. They can result in disability, cancer, and death.  You should be screened for sexually transmitted illnesses (STIs) including gonorrhea and chlamydia if:  You are sexually active and are younger than 24 years.  You are older than 24 years and your health care provider tells you that you are at risk for  this type of infection.  Your sexual activity has changed since you were last screened and you are at an increased risk for chlamydia or gonorrhea. Ask your health care provider if you are at risk.  If you are at risk of being infected with HIV, it is recommended that you take a prescription medicine daily to prevent HIV infection. This is called preexposure prophylaxis (PrEP). You are considered at risk if:  You are a heterosexual woman, are sexually active, and are at increased risk for HIV infection.  You take drugs by injection.  You are sexually active with a partner who has HIV.  Talk with your health care provider about whether you are at high risk of being infected with HIV. If you choose to begin PrEP, you should first be tested for HIV. You should then be tested every 3 months for as long as you are taking PrEP.  Osteoporosis is a disease in which the bones lose minerals and strength with aging. This can result in serious bone fractures or breaks. The risk of osteoporosis can be identified using a bone density scan. Women ages 77 years and over and women at risk for fractures or osteoporosis should discuss screening with their health care providers. Ask your health care provider whether you should take a calcium supplement or vitamin D to reduce the rate of osteoporosis.  Menopause can be associated with physical symptoms and risks. Hormone replacement therapy is available to decrease symptoms and risks. You should talk to your health care provider about whether hormone replacement therapy is right for you.  Use sunscreen. Apply sunscreen liberally and repeatedly throughout the day. You should seek shade when your shadow is shorter than you. Protect yourself by wearing long sleeves, pants, a wide-brimmed hat, and sunglasses year round, whenever you are outdoors.  Once a month, do a whole body skin exam, using a mirror to look at the skin on your back. Tell your health care provider of  new moles, moles that have irregular borders, moles that are larger than a pencil eraser, or moles that have changed in shape or color.  Stay current with required vaccines (immunizations).  Influenza vaccine. All adults  should be immunized every year.  Tetanus, diphtheria, and acellular pertussis (Td, Tdap) vaccine. Pregnant women should receive 1 dose of Tdap vaccine during each pregnancy. The dose should be obtained regardless of the length of time since the last dose. Immunization is preferred during the 27th-36th week of gestation. An adult who has not previously received Tdap or who does not know her vaccine status should receive 1 dose of Tdap. This initial dose should be followed by tetanus and diphtheria toxoids (Td) booster doses every 10 years. Adults with an unknown or incomplete history of completing a 3-dose immunization series with Td-containing vaccines should begin or complete a primary immunization series including a Tdap dose. Adults should receive a Td booster every 10 years.  Varicella vaccine. An adult without evidence of immunity to varicella should receive 2 doses or a second dose if she has previously received 1 dose. Pregnant females who do not have evidence of immunity should receive the first dose after pregnancy. This first dose should be obtained before leaving the health care facility. The second dose should be obtained 4-8 weeks after the first dose.  Human papillomavirus (HPV) vaccine. Females aged 13-26 years who have not received the vaccine previously should obtain the 3-dose series. The vaccine is not recommended for use in pregnant females. However, pregnancy testing is not needed before receiving a dose. If a female is found to be pregnant after receiving a dose, no treatment is needed. In that case, the remaining doses should be delayed until after the pregnancy. Immunization is recommended for any person with an immunocompromised condition through the age of 72  years if she did not get any or all doses earlier. During the 3-dose series, the second dose should be obtained 4-8 weeks after the first dose. The third dose should be obtained 24 weeks after the first dose and 16 weeks after the second dose.  Zoster vaccine. One dose is recommended for adults aged 47 years or older unless certain conditions are present.  Measles, mumps, and rubella (MMR) vaccine. Adults born before [birth year] generally are considered immune to measles and mumps. Adults born in [birth year] or later should have 1 or more doses of MMR vaccine unless there is a contraindication to the vaccine or there is laboratory evidence of immunity to each of the three diseases. A routine second dose of MMR vaccine should be obtained at least 28 days after the first dose for students attending postsecondary schools, health care workers, or international travelers. People who received inactivated measles vaccine or an unknown type of measles vaccine during 1963-1967 should receive 2 doses of MMR vaccine. People who received inactivated mumps vaccine or an unknown type of mumps vaccine before 1979 and are at high risk for mumps infection should consider immunization with 2 doses of MMR vaccine. For females of childbearing age, rubella immunity should be determined. If there is no evidence of immunity, females who are not pregnant should be vaccinated. If there is no evidence of immunity, females who are pregnant should delay immunization until after pregnancy. Unvaccinated health care workers born before [birth year] who lack laboratory evidence of measles, mumps, or rubella immunity or laboratory confirmation of disease should consider measles and mumps immunization with 2 doses of MMR vaccine or rubella immunization with 1 dose of MMR vaccine.  Pneumococcal 13-valent conjugate (PCV13) vaccine. When indicated, a person who is uncertain of her immunization history and has no record of immunization should receive the PCV13 vaccine.  An adult aged 37 years  or older who has certain medical conditions and has not been previously immunized should receive 1 dose of PCV13 vaccine. This PCV13 should be followed with a dose of pneumococcal polysaccharide (PPSV23) vaccine. The PPSV23 vaccine dose should be obtained at least 8 weeks after the dose of PCV13 vaccine. An adult aged 80 years or older who has certain medical conditions and previously received 1 or more doses of PPSV23 vaccine should receive 1 dose of PCV13. The PCV13 vaccine dose should be obtained 1 or more years after the last PPSV23 vaccine dose.  Pneumococcal polysaccharide (PPSV23) vaccine. When PCV13 is also indicated, PCV13 should be obtained first. All adults aged 61 years and older should be immunized. An adult younger than age 42 years who has certain medical conditions should be immunized. Any person who resides in a nursing home or long-term care facility should be immunized. An adult smoker should be immunized. People with an immunocompromised condition and certain other conditions should receive both PCV13 and PPSV23 vaccines. People with human immunodeficiency virus (HIV) infection should be immunized as soon as possible after diagnosis. Immunization during chemotherapy or radiation therapy should be avoided. Routine use of PPSV23 vaccine is not recommended for American Indians, [place] Natives, or people younger than 65 years unless there are medical conditions that require PPSV23 vaccine. When indicated, people who have unknown immunization and have no record of immunization should receive PPSV23 vaccine. One-time revaccination 5 years after the first dose of PPSV23 is recommended for people aged 19-64 years who have chronic kidney failure, nephrotic syndrome, asplenia, or immunocompromised conditions. People who received 1-2 doses of PPSV23 before age 52 years should receive another dose of PPSV23 vaccine at age 75 years or later if at least 5 years have passed since the  previous dose. Doses of PPSV23 are not needed for people immunized with PPSV23 at or after age 72 years.  Meningococcal vaccine. Adults with asplenia or persistent complement component deficiencies should receive 2 doses of quadrivalent meningococcal conjugate (MenACWY-D) vaccine. The doses should be obtained at least 2 months apart. Microbiologists working with certain meningococcal bacteria, [employer] recruits, people at risk during an outbreak, and people who travel to or live in countries with a high rate of meningitis should be immunized. A first-year college student up through age 48 years who is living in a residence hall should receive a dose if she did not receive a dose on or after her 16th birthday. Adults who have certain high-risk conditions should receive one or more doses of vaccine.  Hepatitis A vaccine. Adults who wish to be protected from this disease, have certain high-risk conditions, work with hepatitis A-infected animals, work in hepatitis A research labs, or travel to or work in countries with a high rate of hepatitis A should be immunized. Adults who were previously unvaccinated and who anticipate close contact with an international adoptee during the first 60 days after arrival in the [place] from a country with a high rate of hepatitis A should be immunized.  Hepatitis B vaccine. Adults who wish to be protected from this disease, have certain high-risk conditions, may be exposed to blood or other infectious body fluids, are household contacts or sex partners of hepatitis B positive people, are clients or workers in certain care facilities, or travel to or work in countries with a high rate of hepatitis B should be immunized.  Haemophilus influenzae type b (Hib) vaccine. A previously unvaccinated person with asplenia or sickle cell disease or having a scheduled  splenectomy should receive 1 dose of Hib vaccine. Regardless of previous immunization, a recipient of a hematopoietic  stem cell transplant should receive a 3-dose series 6-12 months after her successful transplant. Hib vaccine is not recommended for adults with HIV infection. Preventive Services / Frequency Ages 20 to 77 years  Blood pressure check.** / Every 1 to 2 years.  Lipid and cholesterol check.** / Every 5 years beginning at age 56.  Clinical breast exam.** / Every 3 years for women in their 9s and 3s.  BRCA-related cancer risk assessment.** / For women who have family members with a BRCA-related cancer (breast, ovarian, tubal, or peritoneal cancers).  Pap test.** / Every 2 years from ages 72 through 52. Every 3 years starting at age 26 through age 45 or 25 with a history of 3 consecutive normal Pap tests.  HPV screening.** / Every 3 years from ages 34 through ages 46 to 13 with a history of 3 consecutive normal Pap tests.  Hepatitis C blood test.** / For any individual with known risks for hepatitis C.  Skin self-exam. / Monthly.  Influenza vaccine. / Every year.  Tetanus, diphtheria, and acellular pertussis (Tdap, Td) vaccine.** / Consult your health care provider. Pregnant women should receive 1 dose of Tdap vaccine during each pregnancy. 1 dose of Td every 10 years.  Varicella vaccine.** / Consult your health care provider. Pregnant females who do not have evidence of immunity should receive the first dose after pregnancy.  HPV vaccine. / 3 doses over 6 months, if 18 and younger. The vaccine is not recommended for use in pregnant females. However, pregnancy testing is not needed before receiving a dose.  Measles, mumps, rubella (MMR) vaccine.** / You need at least 1 dose of MMR if you were born in 1957 or later. You may also need a 2nd dose. For females of childbearing age, rubella immunity should be determined. If there is no evidence of immunity, females who are not pregnant should be vaccinated. If there is no evidence of immunity, females who are pregnant should delay immunization until  after pregnancy.  Pneumococcal 13-valent conjugate (PCV13) vaccine.** / Consult your health care provider.  Pneumococcal polysaccharide (PPSV23) vaccine.** / 1 to 2 doses if you smoke cigarettes or if you have certain conditions.  Meningococcal vaccine.** / 1 dose if you are age 81 to 12 years and a Market researcher living in a residence hall, or have one of several medical conditions, you need to get vaccinated against meningococcal disease. You may also need additional booster doses.  Hepatitis A vaccine.** / Consult your health care provider.  Hepatitis B vaccine.** / Consult your health care provider.  Haemophilus influenzae type b (Hib) vaccine.** / Consult your health care provider. Ages 56 to 29 years  Blood pressure check.** / Every 1 to 2 years.  Lipid and cholesterol check.** / Every 5 years beginning at age 51 years.  Lung cancer screening. / Every year if you are aged 48-80 years and have a 30-pack-year history of smoking and currently smoke or have quit within the past 15 years. Yearly screening is stopped once you have quit smoking for at least 15 years or develop a health problem that would prevent you from having lung cancer treatment.  Clinical breast exam.** / Every year after age 96 years.  BRCA-related cancer risk assessment.** / For women who have family members with a BRCA-related cancer (breast, ovarian, tubal, or peritoneal cancers).  Mammogram.** / Every year beginning at age 66  years and continuing for as long as you are in good health. Consult with your health care provider.  Pap test.** / Every 3 years starting at age 18 years through age 80 or 76 years with a history of 3 consecutive normal Pap tests.  HPV screening.** / Every 3 years from ages 16 years through ages 61 to 43 years with a history of 3 consecutive normal Pap tests.  Fecal occult blood test (FOBT) of stool. / Every year beginning at age 41 years and continuing until age 33 years.  You may not need to do this test if you get a colonoscopy every 10 years.  Flexible sigmoidoscopy or colonoscopy.** / Every 5 years for a flexible sigmoidoscopy or every 10 years for a colonoscopy beginning at age 64 years and continuing until age 28 years.  Hepatitis C blood test.** / For all people born from [birth year] through 1965 and any individual with known risks for hepatitis C.  Skin self-exam. / Monthly.  Influenza vaccine. / Every year.  Tetanus, diphtheria, and acellular pertussis (Tdap/Td) vaccine.** / Consult your health care provider. Pregnant women should receive 1 dose of Tdap vaccine during each pregnancy. 1 dose of Td every 10 years.  Varicella vaccine.** / Consult your health care provider. Pregnant females who do not have evidence of immunity should receive the first dose after pregnancy.  Zoster vaccine.** / 1 dose for adults aged 44 years or older.  Measles, mumps, rubella (MMR) vaccine.** / You need at least 1 dose of MMR if you were born in 1957 or later. You may also need a 2nd dose. For females of childbearing age, rubella immunity should be determined. If there is no evidence of immunity, females who are not pregnant should be vaccinated. If there is no evidence of immunity, females who are pregnant should delay immunization until after pregnancy.  Pneumococcal 13-valent conjugate (PCV13) vaccine.** / Consult your health care provider.  Pneumococcal polysaccharide (PPSV23) vaccine.** / 1 to 2 doses if you smoke cigarettes or if you have certain conditions.  Meningococcal vaccine.** / Consult your health care provider.  Hepatitis A vaccine.** / Consult your health care provider.  Hepatitis B vaccine.** / Consult your health care provider.  Haemophilus influenzae type b (Hib) vaccine.** / Consult your health care provider. Ages 15 years and over  Blood pressure check.** / Every 1 to 2 years.  Lipid and cholesterol check.** / Every 5 years beginning at age 50  years.  Lung cancer screening. / Every year if you are aged 54-80 years and have a 30-pack-year history of smoking and currently smoke or have quit within the past 15 years. Yearly screening is stopped once you have quit smoking for at least 15 years or develop a health problem that would prevent you from having lung cancer treatment.  Clinical breast exam.** / Every year after age 64 years.  BRCA-related cancer risk assessment.** / For women who have family members with a BRCA-related cancer (breast, ovarian, tubal, or peritoneal cancers).  Mammogram.** / Every year beginning at age 20 years and continuing for as long as you are in good health. Consult with your health care provider.  Pap test.** / Every 3 years starting at age 28 years through age 63 or 33 years with 3 consecutive normal Pap tests. Testing can be stopped between 65 and 70 years with 3 consecutive normal Pap tests and no abnormal Pap or HPV tests in the past 10 years.  HPV screening.** / Every 3 years from  ages 87 years through ages 46 or 72 years with a history of 3 consecutive normal Pap tests. Testing can be stopped between 65 and 70 years with 3 consecutive normal Pap tests and no abnormal Pap or HPV tests in the past 10 years.  Fecal occult blood test (FOBT) of stool. / Every year beginning at age 75 years and continuing until age 60 years. You may not need to do this test if you get a colonoscopy every 10 years.  Flexible sigmoidoscopy or colonoscopy.** / Every 5 years for a flexible sigmoidoscopy or every 10 years for a colonoscopy beginning at age 22 years and continuing until age 56 years.  Hepatitis C blood test.** / For all people born from [birth year] through 1965 and any individual with known risks for hepatitis C.  Osteoporosis screening.** / A one-time screening for women ages 32 years and over and women at risk for fractures or osteoporosis.  Skin self-exam. / Monthly.  Influenza vaccine. / Every year.  Tetanus,  diphtheria, and acellular pertussis (Tdap/Td) vaccine.** / 1 dose of Td every 10 years.  Varicella vaccine.** / Consult your health care provider.  Zoster vaccine.** / 1 dose for adults aged 80 years or older.  Pneumococcal 13-valent conjugate (PCV13) vaccine.** / Consult your health care provider.  Pneumococcal polysaccharide (PPSV23) vaccine.** / 1 dose for all adults aged 90 years and older.  Meningococcal vaccine.** / Consult your health care provider.  Hepatitis A vaccine.** / Consult your health care provider.  Hepatitis B vaccine.** / Consult your health care provider.  Haemophilus influenzae type b (Hib) vaccine.** / Consult your health care provider. ** Family history and personal history of risk and conditions may change your health care provider's recommendations. Document Released: 06/22/2001 Document Revised: 09/10/2013 Document Reviewed: 09/21/2010 [facility] Patient Information 2015 [place], [place]. This information is not intended to replace advice given to you by your health care provider. Make sure you discuss any questions you have with your health care provider.

## 2014-02-21 LAB — COMPREHENSIVE METABOLIC PANEL
ALK PHOS: 69 U/L (ref 39–117)
ALT: 12 U/L (ref 0–35)
AST: 27 U/L (ref 0–37)
Albumin: 4.1 g/dL (ref 3.5–5.2)
BILIRUBIN TOTAL: 0.6 mg/dL (ref 0.2–1.2)
BUN: 8 mg/dL (ref 6–23)
CO2: 24 mEq/L (ref 19–32)
Calcium: 9.3 mg/dL (ref 8.4–10.5)
Chloride: 105 mEq/L (ref 96–112)
Creat: 0.76 mg/dL (ref 0.50–1.10)
Glucose, Bld: 116 mg/dL — ABNORMAL HIGH (ref 70–99)
Potassium: 3.8 mEq/L (ref 3.5–5.3)
Sodium: 139 mEq/L (ref 135–145)
TOTAL PROTEIN: 6.6 g/dL (ref 6.0–8.3)

## 2014-02-21 LAB — LIPID PANEL
CHOL/HDL RATIO: 4.1 ratio
CHOLESTEROL: 145 mg/dL (ref 0–200)
HDL: 35 mg/dL — AB (ref >39)
LDL Cholesterol: 85 mg/dL (ref 0–99)
TRIGLYCERIDES: 125 mg/dL (ref <150)
VLDL: 25 mg/dL (ref 0–40)

## 2014-02-21 LAB — URINE CYTOLOGY ANCILLARY ONLY
Chlamydia: NEGATIVE
NEISSERIA GONORRHEA: NEGATIVE

## 2014-02-21 LAB — HIV ANTIBODY (ROUTINE TESTING W REFLEX): HIV 1&2 Ab, 4th Generation: NONREACTIVE

## 2014-02-21 LAB — TSH: TSH: 0.714 u[IU]/mL (ref 0.350–4.500)

## 2014-02-21 LAB — RPR

## 2014-02-22 ENCOUNTER — Other Ambulatory Visit: Payer: Self-pay

## 2014-03-01 ENCOUNTER — Ambulatory Visit (INDEPENDENT_AMBULATORY_CARE_PROVIDER_SITE_OTHER): Payer: Medicaid Other | Admitting: Family Medicine

## 2014-03-01 ENCOUNTER — Encounter: Payer: Self-pay | Admitting: Family Medicine

## 2014-03-01 VITALS — BP 155/90 | HR 121 | Temp 98.0°F | Wt 226.0 lb

## 2014-03-01 DIAGNOSIS — J301 Allergic rhinitis due to pollen: Secondary | ICD-10-CM

## 2014-03-01 DIAGNOSIS — N644 Mastodynia: Secondary | ICD-10-CM

## 2014-03-01 MED ORDER — CETIRIZINE HCL 10 MG PO TABS: 10.0000 mg | ORAL_TABLET | Freq: Every day | ORAL | Status: DC

## 2014-03-01 NOTE — Patient Instructions (Addendum)
Your teeth do not look infected but you should see the surgeon about getting them out before they get worse.  I think your breast pain is caused by your cycles and you can take tylenol for this.  Your dry throat may be coming from allergies so we can try a medicine for that to see if it helps.

## 2014-03-07 ENCOUNTER — Ambulatory Visit: Payer: Medicaid Other

## 2014-03-11 ENCOUNTER — Encounter: Payer: Self-pay | Admitting: Family Medicine

## 2014-03-14 ENCOUNTER — Ambulatory Visit (INDEPENDENT_AMBULATORY_CARE_PROVIDER_SITE_OTHER): Payer: Medicaid Other | Admitting: Family Medicine

## 2014-03-14 VITALS — BP 133/80 | HR 108 | Temp 98.8°F | Resp 13 | Wt 229.5 lb

## 2014-03-14 DIAGNOSIS — Z308 Encounter for other contraceptive management: Secondary | ICD-10-CM

## 2014-03-14 DIAGNOSIS — Z30017 Encounter for initial prescription of implantable subdermal contraceptive: Secondary | ICD-10-CM

## 2014-03-14 DIAGNOSIS — Z3009 Encounter for other general counseling and advice on contraception: Secondary | ICD-10-CM

## 2014-03-14 LAB — POCT URINE PREGNANCY: PREG TEST UR: NEGATIVE

## 2014-03-14 NOTE — Progress Notes (Signed)
   HPI  Patient presents today for Nexplanon insertion.  Patient is here for contraceptive implant insertion. Upon discussing with the patient she had many questions about the insertion and removal processes. I spent a significant amount time discussing with her the insertion and removal processes as well as any risks involved and the procedures. Patient was very uneasy about undergoing this procedure. I provided a significant amount of information for the patient to look over at home. Ultimately patient decided that she was not ready to have this procedure performed today. I agree with her that if she was at all unsure at this time that she should wait until she is confident that the procedure is something she is truly interested in.   Smoking status noted ROS: Per HPI  Objective: BP 133/80 mmHg  Pulse 108  Temp(Src) 98.8 F (37.1 C)  Resp 13  Wt 229 lb 8 oz (104.101 kg)  SpO2 100%  LMP 02/22/2014 Gen: NAD, alert, cooperative with exam HEENT: NCAT, EOMI, PERRL CV: RRR, good S1/S2, no murmur Resp: CTABL, no wheezes, non-labored Ext: No edema, warm Neuro: Alert and oriented, No gross deficits  Assessment and plan:  Encounter for other general counseling or advice on contraception Significant discussion as well as answering the patient inquiries occurred during this visit. I supplied patient with 3 different types of information on contraceptives and specifically implant options. Patient is very thankful for this information. Advised patient to reschedule an appointment for implant procedure when patient is 100% ready and confident this is something she wants to have performed.    Orders Placed This Encounter  Procedures  . POCT urine pregnancy    No orders of the defined types were placed in this encounter.     Kathee DeltonIan D McKeag, MD,MS,  PGY1 03/14/2014 12:25 PM

## 2014-03-14 NOTE — Progress Notes (Signed)
FM/GYN NOTE Nicolette BangKehinde Kirsi Hugh,MD  I have discussed this patient with the resident. I agree with the resident's findings, assessment and care plan.

## 2014-03-14 NOTE — Patient Instructions (Addendum)
Contraception Choices Contraception (birth control) is the use of any methods or devices to prevent pregnancy. Below are some methods to help avoid pregnancy. HORMONAL METHODS   Contraceptive implant. This is a thin, plastic tube containing progesterone hormone. It does not contain estrogen hormone. Your health care provider inserts the tube in the inner part of the upper arm. The tube can remain in place for up to 3 years. After 3 years, the implant must be removed. The implant prevents the ovaries from releasing an egg (ovulation), thickens the cervical mucus to prevent sperm from entering the uterus, and thins the lining of the inside of the uterus.  Progesterone-only injections. These injections are given every 3 months by your health care provider to prevent pregnancy. This synthetic progesterone hormone stops the ovaries from releasing eggs. It also thickens cervical mucus and changes the uterine lining. This makes it harder for sperm to survive in the uterus.  Birth control pills. These pills contain estrogen and progesterone hormone. They work by preventing the ovaries from releasing eggs (ovulation). They also cause the cervical mucus to thicken, preventing the sperm from entering the uterus. Birth control pills are prescribed by a health care provider.Birth control pills can also be used to treat heavy periods.  Minipill. This type of birth control pill contains only the progesterone hormone. They are taken every day of each month and must be prescribed by your health care provider.  Birth control patch. The patch contains hormones similar to those in birth control pills. It must be changed once a week and is prescribed by a health care provider.  Vaginal ring. The ring contains hormones similar to those in birth control pills. It is left in the vagina for 3 weeks, removed for 1 week, and then a new one is put back in place. The patient must be comfortable inserting and removing the ring  from the vagina.A health care provider's prescription is necessary.  Emergency contraception. Emergency contraceptives prevent pregnancy after unprotected sexual intercourse. This pill can be taken right after sex or up to 5 days after unprotected sex. It is most effective the sooner you take the pills after having sexual intercourse. Most emergency contraceptive pills are available without a prescription. Check with your pharmacist. Do not use emergency contraception as your only form of birth control. BARRIER METHODS   Female condom. This is a thin sheath (latex or rubber) that is worn over the penis during sexual intercourse. It can be used with spermicide to increase effectiveness.  Female condom. This is a soft, loose-fitting sheath that is put into the vagina before sexual intercourse.  Diaphragm. This is a soft, latex, dome-shaped barrier that must be fitted by a health care provider. It is inserted into the vagina, along with a spermicidal jelly. It is inserted before intercourse. The diaphragm should be left in the vagina for 6 to 8 hours after intercourse.  Cervical cap. This is a round, soft, latex or plastic cup that fits over the cervix and must be fitted by a health care provider. The cap can be left in place for up to 48 hours after intercourse.  Sponge. This is a soft, circular piece of polyurethane foam. The sponge has spermicide in it. It is inserted into the vagina after wetting it and before sexual intercourse.  Spermicides. These are chemicals that kill or block sperm from entering the cervix and uterus. They come in the form of creams, jellies, suppositories, foam, or tablets. They do not require a   prescription. They are inserted into the vagina with an applicator before having sexual intercourse. The process must be repeated every time you have sexual intercourse. INTRAUTERINE CONTRACEPTION  Intrauterine device (IUD). This is a T-shaped device that is put in a woman's uterus  during a menstrual period to prevent pregnancy. There are 2 types:  Copper IUD. This type of IUD is wrapped in copper wire and is placed inside the uterus. Copper makes the uterus and fallopian tubes produce a fluid that kills sperm. It can stay in place for 10 years.  Hormone IUD. This type of IUD contains the hormone progestin (synthetic progesterone). The hormone thickens the cervical mucus and prevents sperm from entering the uterus, and it also thins the uterine lining to prevent implantation of a fertilized egg. The hormone can weaken or kill the sperm that get into the uterus. It can stay in place for 3-5 years, depending on which type of IUD is used. PERMANENT METHODS OF CONTRACEPTION  Female tubal ligation. This is when the woman's fallopian tubes are surgically sealed, tied, or blocked to prevent the egg from traveling to the uterus.  Hysteroscopic sterilization. This involves placing a small coil or insert into each fallopian tube. Your doctor uses a technique called hysteroscopy to do the procedure. The device causes scar tissue to form. This results in permanent blockage of the fallopian tubes, so the sperm cannot fertilize the egg. It takes about 3 months after the procedure for the tubes to become blocked. You must use another form of birth control for these 3 months.  Female sterilization. This is when the female has the tubes that carry sperm tied off (vasectomy).This blocks sperm from entering the vagina during sexual intercourse. After the procedure, the man can still ejaculate fluid (semen). NATURAL PLANNING METHODS  Natural family planning. This is not having sexual intercourse or using a barrier method (condom, diaphragm, cervical cap) on days the woman could become pregnant.  Calendar method. This is keeping track of the length of each menstrual cycle and identifying when you are fertile.  Ovulation method. This is avoiding sexual intercourse during ovulation.  Symptothermal  method. This is avoiding sexual intercourse during ovulation, using a thermometer and ovulation symptoms.  Post-ovulation method. This is timing sexual intercourse after you have ovulated. Regardless of which type or method of contraception you choose, it is important that you use condoms to protect against the transmission of sexually transmitted infections (STIs). Talk with your health care provider about which form of contraception is most appropriate for you. Document Released: 04/26/2005 Document Revised: 05/01/2013 Document Reviewed: 10/19/2012 [facility] Patient Information 2015 [place]. This information is not intended to replace advice given to you by your health care provider. Make sure you discuss any questions you have with your health care provider.    Etonogestrel implant (Nexplanon) What is this medicine? ETONOGESTREL (et oh noe JES trel) is a contraceptive (birth control) device. It is used to prevent pregnancy. It can be used for up to 3 years. This medicine may be used for other purposes; ask your health care provider or pharmacist if you have questions. COMMON BRAND NAME(S): Implanon, Nexplanon What should I tell my health care provider before I take this medicine? They need to know if you have any of these conditions: -abnormal vaginal bleeding -blood vessel disease or blood clots -cancer of the breast, cervix, or liver -depression -diabetes -gallbladder disease -headaches -heart disease or recent heart attack -high blood pressure -high cholesterol -kidney disease -liver disease -  renal disease -seizures -tobacco smoker -an unusual or allergic reaction to etonogestrel, other hormones, anesthetics or antiseptics, medicines, foods, dyes, or preservatives -pregnant or trying to get pregnant -breast-feeding How should I use this medicine? This device is inserted just under the skin on the inner side of your upper arm by a health care professional. Talk to your  pediatrician regarding the use of this medicine in children. Special care may be needed. Overdosage: If you think you've taken too much of this medicine contact a poison control center or emergency room at once. Overdosage: If you think you have taken too much of this medicine contact a poison control center or emergency room at once. NOTE: This medicine is only for you. Do not share this medicine with others. What if I miss a dose? This does not apply. What may interact with this medicine? Do not take this medicine with any of the following medications: -amprenavir -bosentan -fosamprenavir This medicine may also interact with the following medications: -barbiturate medicines for inducing sleep or treating seizures -certain medicines for fungal infections like ketoconazole and itraconazole -griseofulvin -medicines to treat seizures like carbamazepine, felbamate, oxcarbazepine, phenytoin, topiramate -modafinil -phenylbutazone -rifampin -some medicines to treat HIV infection like atazanavir, indinavir, lopinavir, nelfinavir, tipranavir, ritonavir -St. John's wort This list may not describe all possible interactions. Give your health care provider a list of all the medicines, herbs, non-prescription drugs, or dietary supplements you use. Also tell them if you smoke, drink alcohol, or use illegal drugs. Some items may interact with your medicine. What should I watch for while using this medicine? This product does not protect you against HIV infection (AIDS) or other sexually transmitted diseases. You should be able to feel the implant by pressing your fingertips over the skin where it was inserted. Tell your doctor if you cannot feel the implant. What side effects may I notice from receiving this medicine? Side effects that you should report to your doctor or health care professional as soon as possible: -allergic reactions like skin rash, itching or hives, swelling of the face, lips, or  tongue -breast lumps -changes in vision -confusion, trouble speaking or understanding -dark urine -depressed mood -general ill feeling or flu-like symptoms -light-colored stools -loss of appetite, nausea -right upper belly pain -severe headaches -severe pain, swelling, or tenderness in the abdomen -shortness of breath, chest pain, swelling in a leg -signs of pregnancy -sudden numbness or weakness of the face, arm or leg -trouble walking, dizziness, loss of balance or coordination -unusual vaginal bleeding, discharge -unusually weak or tired -yellowing of the eyes or skin Side effects that usually do not require medical attention (Report these to your doctor or health care professional if they continue or are bothersome.): -acne -breast pain -changes in weight -cough -fever or chills -headache -irregular menstrual bleeding -itching, burning, and vaginal discharge -pain or difficulty passing urine -sore throat This list may not describe all possible side effects. Call your doctor for medical advice about side effects. You may report side effects to FDA at 1-800-FDA-1088. Where should I keep my medicine? This drug is given in a hospital or clinic and will not be stored at home. NOTE: This sheet is a summary. It may not cover all possible information. If you have questions about this medicine, talk to your doctor, pharmacist, or health care provider.  2015, [employer]. (2011-11-01 15:37:45)

## 2014-03-14 NOTE — Assessment & Plan Note (Signed)
Significant discussion as well as answering the patient inquiries occurred during this visit. I supplied patient with 3 different types of information on contraceptives and specifically implant options. Patient is very thankful for this information. Advised patient to reschedule an appointment for implant procedure when patient is 100% ready and confident this is something she wants to have performed.

## 2014-03-15 ENCOUNTER — Telehealth: Payer: Self-pay | Admitting: *Deleted

## 2014-03-15 DIAGNOSIS — Z3009 Encounter for other general counseling and advice on contraception: Secondary | ICD-10-CM

## 2014-03-15 NOTE — Telephone Encounter (Signed)
Pt called requesting Rx for the nuvaring be sent in to her pharmacy.  [facility]  After speaking with the provider in detailed; pt decided against the nexplanon and left clinic without any method.  Please send in Rx to [facility] off of [address]  Clovis PuMartin, Jeptha Hinnenkamp L, RN

## 2014-03-17 MED ORDER — ETONOGESTREL-ETHINYL ESTRADIOL 0.12-0.015 MG/24HR VA RING: VAGINAL_RING | VAGINAL | Status: DC

## 2014-03-17 NOTE — Telephone Encounter (Signed)
Nuva ring sent in--please make sure she starts after next normal cycle to ensure no pregnancy--also, needs BP check at 1 month following initiation as it can increase her BP.

## 2014-03-18 NOTE — Telephone Encounter (Signed)
Pt is aware of message and will plan to make an appt after starting nuvaring. Lorina Duffner,CMA

## 2014-03-22 ENCOUNTER — Other Ambulatory Visit: Payer: Self-pay | Admitting: Obstetrics & Gynecology

## 2014-03-25 ENCOUNTER — Telehealth: Payer: Self-pay

## 2014-03-25 DIAGNOSIS — N644 Mastodynia: Secondary | ICD-10-CM | POA: Insufficient documentation

## 2014-03-25 DIAGNOSIS — Z3009 Encounter for other general counseling and advice on contraception: Secondary | ICD-10-CM

## 2014-03-25 MED ORDER — NORGESTIMATE-ETH ESTRADIOL 0.25-35 MG-MCG PO TABS: 1.0000 | ORAL_TABLET | Freq: Every day | ORAL | Status: DC

## 2014-03-25 NOTE — Assessment & Plan Note (Signed)
Dry/sore throat today, likely related to postnasal drip from seasonal allergies - trial of zyrtec

## 2014-03-25 NOTE — Telephone Encounter (Signed)
Expand All Collapse All   OC's sent in--please make sure she starts after next normal cycle to ensure no pregnancy--also, needs BP check at 1 month following initiation, as it can increase her BP.

## 2014-03-25 NOTE — Progress Notes (Signed)
   Subjective:    Patient ID: Tonya Owens, female    DOB: Nov 28, 1980, 33 y.o.   MRN: [id number]  HPI Pt presents for breast soreness for 1 week. Pain is a dull ache diffusely in both breasts that comes and goes. Mildly tender, no bumps, not lactating. No fevers/chills. Not pregnant, on birth control.    Review of Systems See HPI    Objective:   Physical Exam  Constitutional: She is oriented to person, place, and time. She appears well-developed and well-nourished. No distress.  HENT:  Head: Normocephalic and atraumatic.  Eyes: Conjunctivae are normal. Right eye exhibits no discharge. Left eye exhibits no discharge. No scleral icterus.  Cardiovascular: Normal rate, regular rhythm and normal heart sounds.   No murmur heard. Pulmonary/Chest: Effort normal and breath sounds normal. No respiratory distress. She has no wheezes. Right breast exhibits tenderness. Right breast exhibits no inverted nipple, no mass, no nipple discharge and no skin change. Left breast exhibits tenderness. Left breast exhibits no inverted nipple, no mass, no nipple discharge and no skin change. Breasts are symmetrical.  Abdominal: She exhibits no distension.  Genitourinary: No breast swelling, tenderness, discharge or bleeding.  Neurological: She is alert and oriented to person, place, and time.  Skin: Skin is warm and dry. No rash noted. She is not diaphoretic.  Psychiatric: She has a normal mood and affect. Her behavior is normal.  Nursing note and vitals reviewed.         Assessment & Plan:

## 2014-03-25 NOTE — Telephone Encounter (Signed)
Called patient and informed her of OCP RX at pharmacy and recommendation. Patient started cycle yesterday-- advised she start pack this upcoming Sunday. Patient verbalized understanding. Informed her she will need a BP check-- appointment made for 05/06/14 at 1500. Patient aware. No questions or concerns.

## 2014-03-25 NOTE — Telephone Encounter (Signed)
Patient called stated she would like her OCPs refilled. Per chart review, patient [facility] Called patient who states she does not want Nuva Ring. Message sent to Dr. Shawnie PonsPratt regarding patient's request to change to OCPs. Informed patient I would call her if OCPs refilled. Patient verbalized understanding and gratitude. No further questions or concerns.

## 2014-03-25 NOTE — Telephone Encounter (Signed)
-----   Message from Louanna Rawaylor M Campbell, RN sent at 03/25/2014  9:59 AM EST ----- Hi Dr. Shawnie PonsPratt,   Patient called stating she does not want the Nuva ring-- instead, would like OCPs prescribed. Please advise.   Thank you,  Ladona Ridgelaylor, RN

## 2014-03-25 NOTE — Assessment & Plan Note (Addendum)
1 week of sore/tender breasts, period 1 week late, planning nexplanon or iud insertion, not worried about pregnancy - normal breast exam, no lumps - likely hormonal, rec return if not resolved in 2 weeks

## 2014-04-15 ENCOUNTER — Emergency Department (INDEPENDENT_AMBULATORY_CARE_PROVIDER_SITE_OTHER)
Admission: EM | Admit: 2014-04-15 | Discharge: 2014-04-15 | Disposition: A | Discharge status: Home / Self Care | Payer: Medicaid Other | Source: Home / Self Care | Attending: Family Medicine | Admitting: Family Medicine

## 2014-04-15 ENCOUNTER — Encounter (HOSPITAL_COMMUNITY): Payer: Self-pay | Admitting: Emergency Medicine

## 2014-04-15 DIAGNOSIS — J9801 Acute bronchospasm: Secondary | ICD-10-CM

## 2014-04-15 DIAGNOSIS — K2100 Gastro-esophageal reflux disease with esophagitis, without bleeding: Secondary | ICD-10-CM

## 2014-04-15 MED ORDER — ALBUTEROL SULFATE HFA 108 (90 BASE) MCG/ACT IN AERS: 2.0000 | INHALATION_SPRAY | RESPIRATORY_TRACT | Status: DC | PRN

## 2014-04-15 NOTE — ED Provider Notes (Signed)
CSN: [id number]     Arrival date & time 04/15/14  1933 History   First MD Initiated Contact with Patient 04/15/14 2008     Chief Complaint  Patient presents with  . Shortness of Breath   (Consider location/radiation/quality/duration/timing/severity/associated sxs/prior Treatment) HPI Comments: 33 year old mildly obese female complaining of increased gas, belching, reflux and heartburn symptoms for 2 days. She has a history of gastritis from excessive NSAID use approximate one year ago. She was treated with H2 blockers at the time that is not taken any of his medications recently. Denies abdominal pain or vomiting. Certain foods exacerbate her condition as well as lying in a supine position. Occasionally she hears herself wheezing at night and since his shortness of breath. She believes she is having these due to reflux symptoms as she has in the past. She has used albuterol HFA in the past for same symptom.   History reviewed. No pertinent past medical history. History reviewed. No pertinent past surgical history. Family History  Problem Relation Age of Onset  . Hypertension Mother   . Diabetes Father   . Diabetes Maternal Grandmother    History  Substance Use Topics  . Smoking status: Former Smoker -- 0.50 packs/day for 13 years    Types: Cigarettes    Start date: 07/24/1996    Quit date: 06/10/2012  . Smokeless tobacco: Never Used  . Alcohol Use: No   OB History    Gravida Para Term Preterm AB TAB SAB Ectopic Multiple Living   3 2 2  0 1 0 0 1 0 1     Review of Systems  Constitutional: Negative for fever and fatigue.  Respiratory: Positive for shortness of breath and wheezing. Negative for cough, chest tightness and stridor.   Cardiovascular: Negative for chest pain, palpitations and leg swelling.  Gastrointestinal: Negative for vomiting, abdominal pain, diarrhea, abdominal distention and anal bleeding.       As per history of present illness  Genitourinary: Negative.   Skin:  Negative.     Allergies  Omeprazole and Amlodipine  Home Medications   Prior to Admission medications   Medication Sig Start Date End Date Taking? Authorizing Provider  albuterol (PROVENTIL HFA;VENTOLIN HFA) 108 (90 BASE) MCG/ACT inhaler Inhale 2 puffs into the lungs every 4 (four) hours as needed for wheezing or shortness of breath. 04/15/14   Hayden Rasmussenavid Tywone Bembenek, NP  cetirizine (ZYRTEC) 10 MG tablet Take 1 tablet (10 mg total) by mouth daily. 03/01/14   Abram SanderElena M Adamo, MD  fluconazole (DIFLUCAN) 150 MG tablet Take 1 tablet (150 mg total) by mouth once. 09/26/13   Adam PhenixJames G Arnold, MD  fluticasone (FLONASE) 50 MCG/ACT nasal spray Place 2 sprays into both nostrils daily. 09/07/13   Amber Nydia BoutonM Hairford, MD  LOW-OGESTREL 0.3-30 MG-MCG tablet TAKE 1 TABLET BY MOUTH DAILY 04/01/14   Allie BossierMyra C Dove, MD  montelukast (SINGULAIR) 10 MG tablet Take 1 tablet (10 mg total) by mouth at bedtime. 09/07/13   Amber Nydia BoutonM Hairford, MD  norgestimate-ethinyl estradiol (ORTHO-CYCLEN,SPRINTEC,PREVIFEM) 0.25-35 MG-MCG tablet Take 1 tablet by mouth daily. 03/25/14   Reva Boresanya S Pratt, MD   BP 178/96 mmHg  Pulse 90  Temp(Src) 99.6 F (37.6 C) (Oral)  Resp 18  SpO2 100% Physical Exam  Constitutional: She is oriented to person, place, and time. She appears well-developed and well-nourished. No distress.  HENT:  Mouth/Throat: Oropharynx is clear and moist. No oropharyngeal exudate.  Eyes: Conjunctivae and EOM are normal.  Neck: Normal range of motion. Neck supple.  Cardiovascular: Normal rate, regular rhythm and normal heart sounds.   Pulmonary/Chest: Effort normal and breath sounds normal. No respiratory distress. She has no wheezes. She has no rales.  Abdominal: Soft. She exhibits no distension and no mass. There is no tenderness. There is no rebound and no guarding.  Musculoskeletal: She exhibits no edema.  Neurological: She is alert and oriented to person, place, and time. She exhibits normal muscle tone.  Skin: Skin is warm and dry.  She is not diaphoretic.  Psychiatric: She has a normal mood and affect.  Nursing note and vitals reviewed.   ED Course  Procedures (including critical care time) Labs Review Labs Reviewed - No data to display  Imaging Review No results found.   MDM   1. Gastroesophageal reflux disease with esophagitis   2. Bronchospasm    Albuterol for wheeze Zantac 150 mg bid Omeprazole or prevacid  As directed daily Diet for reflux F/u with PCP to have pre ordered tests from the past.    Hayden Rasmussenavid Starnisha Batrez, NP 04/15/14 2026

## 2014-04-15 NOTE — ED Notes (Signed)
33 year old female with possible wheezing and some acid reflux.  Started 1 day ago.

## 2014-04-15 NOTE — Discharge Instructions (Signed)
Bronchospasm °A bronchospasm is a spasm or tightening of the airways going into the lungs. During a bronchospasm breathing becomes more difficult because the airways get smaller. When this happens there can be coughing, a whistling sound when breathing (wheezing), and difficulty breathing. Bronchospasm is often associated with asthma, but not all patients who experience a bronchospasm have asthma. °CAUSES  °A bronchospasm is caused by inflammation or irritation of the airways. The inflammation or irritation may be triggered by:  °1. Allergies (such as to animals, pollen, food, or mold). Allergens that cause bronchospasm may cause wheezing immediately after exposure or many hours later.   °2. Infection. Viral infections are believed to be the most common cause of bronchospasm.   °3. Exercise.   °4. Irritants (such as pollution, cigarette smoke, strong odors, aerosol sprays, and paint fumes).   °5. Weather changes. Winds increase molds and pollens in the air. Rain refreshes the air by washing irritants out. Cold air may cause inflammation.   °6. Stress and emotional upset.   °SIGNS AND SYMPTOMS  °· Wheezing.   °· Excessive nighttime coughing.   °· Frequent or severe coughing with a simple cold.   °· Chest tightness.   °· Shortness of breath.   °DIAGNOSIS  °Bronchospasm is usually diagnosed through a history and physical exam. Tests, such as chest X-rays, are sometimes done to look for other conditions. °TREATMENT  °· Inhaled medicines can be given to open up your airways and help you breathe. The medicines can be given using either an inhaler or a nebulizer machine. °· Corticosteroid medicines may be given for severe bronchospasm, usually when it is associated with asthma. °HOME CARE INSTRUCTIONS  °· Always have a plan prepared for seeking medical care. Know when to call your health care provider and local emergency services (911 in the [place] Know where you can access local emergency care. °· Only take medicines as  directed by your health care provider. °· If you were prescribed an inhaler or nebulizer machine, ask your health care provider to explain how to use it correctly. Always use a spacer with your inhaler if you were given one. °· It is necessary to remain calm during an attack. Try to relax and breathe more slowly.  °· Control your home environment in the following ways:   °· Change your heating and air conditioning filter at least once a month.   °· Limit your use of fireplaces and wood stoves. °· Do not smoke and do not allow smoking in your home.   °· Avoid exposure to perfumes and fragrances.   °· Get rid of pests (such as roaches and mice) and their droppings.   °· Throw away plants if you see mold on them.   °· Keep your house clean and dust free.   °· Replace carpet with wood, tile, or vinyl flooring. Carpet can trap dander and dust.   °· Use allergy-proof pillows, mattress covers, and box spring covers.   °· Wash bed sheets and blankets every week in hot water and dry them in a dryer.   °· Use blankets that are made of polyester or cotton.   °· Wash hands frequently. °SEEK MEDICAL CARE IF:  °· You have muscle aches.   °· You have chest pain.   °· The sputum changes from clear or white to yellow, green, gray, or bloody.   °· The sputum you cough up gets thicker.   °· There are problems that may be related to the medicine you are given, such as a rash, itching, swelling, or trouble breathing.   °SEEK IMMEDIATE MEDICAL CARE IF:  °· You have worsening wheezing and coughing even   after taking your prescribed medicines.   You have increased difficulty breathing.   You develop severe chest pain. MAKE SURE YOU:   Understand these instructions.  Will watch your condition.  Will get help right away if you are not doing well or get worse. Document Released: 04/29/2003 Document Revised: 05/01/2013 Document Reviewed: 10/16/2012 [facility] Patient Information 2015 [place], [place]. This information is not  intended to replace advice given to you by your health care provider. Make sure you discuss any questions you have with your health care provider.  Gastroesophageal Reflux Disease, Adult Gastroesophageal reflux disease (GERD) happens when acid from your stomach flows up into the esophagus. When acid comes in contact with the esophagus, the acid causes soreness (inflammation) in the esophagus. Over time, GERD may create small holes (ulcers) in the lining of the esophagus. CAUSES  7. Increased body weight. This puts pressure on the stomach, making acid rise from the stomach into the esophagus. 8. Smoking. This increases acid production in the stomach. 9. Drinking alcohol. This causes decreased pressure in the lower esophageal sphincter (valve or ring of muscle between the esophagus and stomach), allowing acid from the stomach into the esophagus. 10. Late evening meals and a full stomach. This increases pressure and acid production in the stomach. 11. A malformed lower esophageal sphincter. Sometimes, no cause is found. SYMPTOMS   Burning pain in the lower part of the mid-chest behind the breastbone and in the mid-stomach area. This may occur twice a week or more often.  Trouble swallowing.  Sore throat.  Dry cough.  Asthma-like symptoms including chest tightness, shortness of breath, or wheezing. DIAGNOSIS  Your caregiver may be able to diagnose GERD based on your symptoms. In some cases, X-rays and other tests may be done to check for complications or to check the condition of your stomach and esophagus. TREATMENT  Your caregiver may recommend over-the-counter or prescription medicines to help decrease acid production. Ask your caregiver before starting or adding any new medicines.  HOME CARE INSTRUCTIONS   Change the factors that you can control. Ask your caregiver for guidance concerning weight loss, quitting smoking, and alcohol consumption.  Avoid foods and drinks that make your  symptoms worse, such as:  Caffeine or alcoholic drinks.  Chocolate.  Peppermint or mint flavorings.  Garlic and onions.  Spicy foods.  Citrus fruits, such as oranges, lemons, or limes.  Tomato-based foods such as sauce, chili, salsa, and pizza.  Fried and fatty foods.  Avoid lying down for the 3 hours prior to your bedtime or prior to taking a nap.  Eat small, frequent meals instead of large meals.  Wear loose-fitting clothing. Do not wear anything tight around your waist that causes pressure on your stomach.  Raise the head of your bed 6 to 8 inches with wood blocks to help you sleep. Extra pillows will not help.  Only take over-the-counter or prescription medicines for pain, discomfort, or fever as directed by your caregiver.  Do not take aspirin, ibuprofen, or other nonsteroidal anti-inflammatory drugs (NSAIDs). SEEK IMMEDIATE MEDICAL CARE IF:   You have pain in your arms, neck, jaw, teeth, or back.  Your pain increases or changes in intensity or duration.  You develop nausea, vomiting, or sweating (diaphoresis).  You develop shortness of breath, or you faint.  Your vomit is green, yellow, black, or looks like coffee grounds or blood.  Your stool is red, bloody, or black. These symptoms could be signs of other problems, such as heart disease,  gastric bleeding, or esophageal bleeding. MAKE SURE YOU:   Understand these instructions.  Will watch your condition.  Will get help right away if you are not doing well or get worse. Document Released: 02/03/2005 Document Revised: 07/19/2011 Document Reviewed: 11/13/2010 [facility] Patient Information 2015 [place], [place]. This information is not intended to replace advice given to you by your health care provider. Make sure you discuss any questions you have with your health care provider.  Food Choices for Gastroesophageal Reflux Disease When you have gastroesophageal reflux disease (GERD), the foods you eat and your  eating habits are very important. Choosing the right foods can help ease the discomfort of GERD. WHAT GENERAL GUIDELINES DO I NEED TO FOLLOW? 12. Choose fruits, vegetables, whole grains, low-fat dairy products, and low-fat meat, fish, and poultry. 13. Limit fats such as oils, salad dressings, butter, nuts, and avocado. 14. Keep a food diary to identify foods that cause symptoms. 15. Avoid foods that cause reflux. These may be different for different people. 16. Eat frequent small meals instead of three large meals each day. 17. Eat your meals slowly, in a relaxed setting. 18. Limit fried foods. 19. Cook foods using methods other than frying. 20. Avoid drinking alcohol. 21. Avoid drinking large amounts of liquids with your meals. 22. Avoid bending over or lying down until 2-3 hours after eating. WHAT FOODS ARE NOT RECOMMENDED? The following are some foods and drinks that may worsen your symptoms: Vegetables Tomatoes. Tomato juice. Tomato and spaghetti sauce. Chili peppers. Onion and garlic. Horseradish. Fruits Oranges, grapefruit, and lemon (fruit and juice). Meats High-fat meats, fish, and poultry. This includes hot dogs, ribs, ham, sausage, salami, and bacon. Dairy Whole milk and chocolate milk. Sour cream. Cream. Butter. Ice cream. Cream cheese.  Beverages Coffee and tea, with or without caffeine. Carbonated beverages or energy drinks. Condiments Hot sauce. Barbecue sauce.  Sweets/Desserts Chocolate and cocoa. Donuts. Peppermint and spearmint. Fats and Oils High-fat foods, including [place] fries and potato chips. Other Vinegar. Strong spices, such as black pepper, white pepper, red pepper, cayenne, curry powder, cloves, ginger, and chili powder. The items listed above may not be a complete list of foods and beverages to avoid. Contact your dietitian for more information. Document Released: 04/26/2005 Document Revised: 05/01/2013 Document Reviewed: 02/28/2013 [facility] Patient  Information 2015 [place], [place]. This information is not intended to replace advice given to you by your health care provider. Make sure you discuss any questions you have with your health care provider.  How to Use an Inhaler Using your inhaler correctly is very important. Good technique will make sure that the medicine reaches your lungs.  HOW TO USE AN INHALER: 23. Take the cap off the inhaler. 24. If this is the first time using your inhaler, you need to prime it. Shake the inhaler for 5 seconds. Release four puffs into the air, away from your face. Ask your doctor for help if you have questions. 25. Shake the inhaler for 5 seconds. 26. Turn the inhaler so the bottle is above the mouthpiece. 27. Put your pointer finger on top of the bottle. Your thumb holds the bottom of the inhaler. 28. Open your mouth. 29. Either hold the inhaler away from your mouth (the width of 2 fingers) or place your lips tightly around the mouthpiece. Ask your doctor which way to use your inhaler. 30. Breathe out as much air as possible. 31. Breathe in and push down on the bottle 1 time to release the medicine. You will  feel the medicine go in your mouth and throat. 32. Continue to take a deep breath in very slowly. Try to fill your lungs. 33. After you have breathed in completely, hold your breath for 10 seconds. This will help the medicine to settle in your lungs. If you cannot hold your breath for 10 seconds, hold it for as long as you can before you breathe out. 34. Breathe out slowly, through pursed lips. Whistling is an example of pursed lips. 35. If your doctor has told you to take more than 1 puff, wait at least 15-30 seconds between puffs. This will help you get the best results from your medicine. Do not use the inhaler more than your doctor tells you to. 36. Put the cap back on the inhaler. 37. Follow the directions from your doctor or from the inhaler package about cleaning the inhaler. If you use more  than one inhaler, ask your doctor which inhalers to use and what order to use them in. Ask your doctor to help you figure out when you will need to refill your inhaler.  If you use a steroid inhaler, always rinse your mouth with water after your last puff, gargle and spit out the water. Do not swallow the water. GET HELP IF:  The inhaler medicine only partially helps to stop wheezing or shortness of breath.  You are having trouble using your inhaler.  You have some increase in thick spit (phlegm). GET HELP RIGHT AWAY IF:  The inhaler medicine does not help your wheezing or shortness of breath or you have tightness in your chest.  You have dizziness, headaches, or fast heart rate.  You have chills, fever, or night sweats.  You have a large increase of thick spit, or your thick spit is bloody. MAKE SURE YOU:   Understand these instructions.  Will watch your condition.  Will get help right away if you are not doing well or get worse. Document Released: 02/03/2008 Document Revised: 02/14/2013 Document Reviewed: 11/23/2012 [facility] Patient Information 2015 [place], [place]. This information is not intended to replace advice given to you by your health care provider. Make sure you discuss any questions you have with your health care provider.

## 2014-04-20 ENCOUNTER — Encounter (HOSPITAL_COMMUNITY): Payer: Self-pay

## 2014-04-20 ENCOUNTER — Emergency Department (INDEPENDENT_AMBULATORY_CARE_PROVIDER_SITE_OTHER)
Admission: EM | Admit: 2014-04-20 | Discharge: 2014-04-20 | Disposition: A | Discharge status: Home / Self Care | Payer: Medicaid Other | Source: Home / Self Care | Attending: Family Medicine | Admitting: Family Medicine

## 2014-04-20 DIAGNOSIS — W57XXXA Bitten or stung by nonvenomous insect and other nonvenomous arthropods, initial encounter: Secondary | ICD-10-CM

## 2014-04-20 DIAGNOSIS — T148 Other injury of unspecified body region: Secondary | ICD-10-CM

## 2014-04-20 MED ORDER — BETAMETHASONE DIPROPIONATE AUG 0.05 % EX CREA: TOPICAL_CREAM | Freq: Two times a day (BID) | CUTANEOUS | Status: DC

## 2014-04-20 NOTE — ED Provider Notes (Signed)
CSN: [id number]     Arrival date & time 04/20/14  1423 History   First MD Initiated Contact with Patient 04/20/14 1457     Chief Complaint  Patient presents with  . Insect Bite   (Consider location/radiation/quality/duration/timing/severity/associated sxs/prior Treatment) HPI         33 year old female presents complaining of an insect bite to her lateral left leg. She was in her friend's car when she felt a bug bite her. It was a small red bug, she is concerned that it may been a bedbug and she wants to have the insect identified. The area is red, swollen, and itchy. No systemic symptoms.  History reviewed. No pertinent past medical history. History reviewed. No pertinent past surgical history. Family History  Problem Relation Age of Onset  . Hypertension Mother   . Diabetes Father   . Diabetes Maternal Grandmother    History  Substance Use Topics  . Smoking status: Former Smoker -- 0.50 packs/day for 13 years    Types: Cigarettes    Start date: 07/24/1996    Quit date: 06/10/2012  . Smokeless tobacco: Never Used  . Alcohol Use: No   OB History    Gravida Para Term Preterm AB TAB SAB Ectopic Multiple Living   3 2 2  0 1 0 0 1 0 1     Review of Systems  Skin: Positive for rash (see HPI).  All other systems reviewed and are negative.   Allergies  Omeprazole and Amlodipine  Home Medications   Prior to Admission medications   Medication Sig Start Date End Date Taking? Authorizing Provider  albuterol (PROVENTIL HFA;VENTOLIN HFA) 108 (90 BASE) MCG/ACT inhaler Inhale 2 puffs into the lungs every 4 (four) hours as needed for wheezing or shortness of breath. 04/15/14   Hayden Rasmussenavid Mabe, NP  augmented betamethasone dipropionate (DIPROLENE AF) 0.05 % cream Apply topically 2 (two) times daily. 04/20/14   Graylon GoodZachary H Eddye Broxterman, PA-C  cetirizine (ZYRTEC) 10 MG tablet Take 1 tablet (10 mg total) by mouth daily. 03/01/14   Abram SanderElena M Adamo, MD  fluconazole (DIFLUCAN) 150 MG tablet Take 1 tablet (150  mg total) by mouth once. 09/26/13   Adam PhenixJames G Arnold, MD  fluticasone (FLONASE) 50 MCG/ACT nasal spray Place 2 sprays into both nostrils daily. 09/07/13   Amber Nydia BoutonM Hairford, MD  LOW-OGESTREL 0.3-30 MG-MCG tablet TAKE 1 TABLET BY MOUTH DAILY 04/01/14   Allie BossierMyra C Dove, MD  montelukast (SINGULAIR) 10 MG tablet Take 1 tablet (10 mg total) by mouth at bedtime. 09/07/13   Amber Nydia BoutonM Hairford, MD  norgestimate-ethinyl estradiol (ORTHO-CYCLEN,SPRINTEC,PREVIFEM) 0.25-35 MG-MCG tablet Take 1 tablet by mouth daily. 03/25/14   Reva Boresanya S Pratt, MD   BP 131/81 mmHg  Pulse 124  Temp(Src) 98.5 F (36.9 C) (Oral)  Resp 18  SpO2 100%  LMP  (LMP Unknown) Physical Exam  Constitutional: She is oriented to person, place, and time. Vital signs are normal. She appears well-developed and well-nourished. No distress.  HENT:  Head: Normocephalic and atraumatic.  Pulmonary/Chest: Effort normal. No respiratory distress.  Neurological: She is alert and oriented to person, place, and time. She has normal strength. Coordination normal.  Skin: Skin is warm and dry. Rash (a few discrete macular erythematous areas on the left lateral leg with a central 1 mm papule consistent with insect bites) noted. She is not diaphoretic.  Psychiatric: She has a normal mood and affect. Judgment normal.  Nursing note and vitals reviewed.   ED Course  Procedures (including critical care  time) Labs Review Labs Reviewed - No data to display  Imaging Review No results found.   MDM   1. Insect bite    Treat with steroid cream. Follow-up when necessary   Meds ordered this encounter  Medications  . DISCONTD: augmented betamethasone dipropionate (DIPROLENE AF) 0.05 % cream    Sig: Apply topically 2 (two) times daily.    Dispense:  30 g    Refill:  0    Order Specific Question:  Supervising Provider    Answer:  Linna HoffKINDL, JAMES D [phone][[id number]]  . augmented betamethasone dipropionate (DIPROLENE AF) 0.05 % cream    Sig: Apply topically 2 (two) times  daily.    Dispense:  30 g    Refill:  0    Order Specific Question:  Supervising Provider    Answer:  Bradd CanaryKINDL, JAMES D [[id number]]       Graylon GoodZachary H Tipton Ballow, PA-C 04/20/14 1800

## 2014-04-20 NOTE — ED Notes (Signed)
Concerned about poss bedbug bite on her left leg

## 2014-04-20 NOTE — Discharge Instructions (Signed)

## 2014-04-30 ENCOUNTER — Telehealth: Payer: Self-pay | Admitting: *Deleted

## 2014-04-30 MED ORDER — SULFAMETHOXAZOLE-TRIMETHOPRIM 800-160 MG PO TABS: 1.0000 | ORAL_TABLET | Freq: Two times a day (BID) | ORAL | Status: DC

## 2014-04-30 NOTE — Telephone Encounter (Signed)
Pt left message requesting medication to be sent to her pharmacy as she thinks she has a UTI.  I called pt and discussed her concern. She reports having painful urination. She denies pain between voids.  I advised pt that I will send Rx to her pharmacy and if she is still having problems at the time of her appt on 12/28 she should inform us. .  She voiced understanding. Rx for Septra DS e-prescribed per standing order.

## 2014-05-06 ENCOUNTER — Ambulatory Visit: Payer: Medicaid Other | Admitting: *Deleted

## 2014-05-06 VITALS — BP 113/75 | HR 109 | Temp 98.6°F | Wt 227.2 lb

## 2014-05-06 DIAGNOSIS — Z0130 Encounter for examination of blood pressure without abnormal findings: Secondary | ICD-10-CM

## 2014-05-06 DIAGNOSIS — R350 Frequency of micturition: Secondary | ICD-10-CM

## 2014-05-06 LAB — POCT URINALYSIS DIP (DEVICE)
BILIRUBIN URINE: NEGATIVE
GLUCOSE, UA: NEGATIVE mg/dL
Hgb urine dipstick: NEGATIVE
Leukocytes, UA: NEGATIVE
Nitrite: NEGATIVE
Protein, ur: NEGATIVE mg/dL
SPECIFIC GRAVITY, URINE: 1.015 (ref 1.005–1.030)
Urobilinogen, UA: 0.2 mg/dL (ref 0.0–1.0)
pH: 7 (ref 5.0–8.0)

## 2014-05-06 NOTE — Progress Notes (Signed)
Pt is concerned that blood pressure is elevated due to birth control pills.  Pt had symptoms of UTI, Septra prescribed, pt heart rate elevated so she discontinued medication.  Collected urine for urine test.  Pt will make an appointment with the provider to discuss birth control.

## 2014-05-08 ENCOUNTER — Ambulatory Visit: Payer: Medicaid Other | Admitting: Obstetrics & Gynecology

## 2014-06-19 ENCOUNTER — Ambulatory Visit: Payer: Medicaid Other | Admitting: Family Medicine

## 2014-07-02 ENCOUNTER — Ambulatory Visit (INDEPENDENT_AMBULATORY_CARE_PROVIDER_SITE_OTHER): Payer: Medicaid Other | Admitting: Family Medicine

## 2014-07-02 ENCOUNTER — Encounter: Payer: Self-pay | Admitting: Family Medicine

## 2014-07-02 ENCOUNTER — Ambulatory Visit (HOSPITAL_COMMUNITY)
Admission: RE | Admit: 2014-07-02 | Discharge: 2014-07-02 | Disposition: A | Discharge status: Home / Self Care | Payer: Medicaid Other | Source: Ambulatory Visit | Attending: Family Medicine | Admitting: Family Medicine

## 2014-07-02 VITALS — BP 149/97 | HR 91 | Temp 98.2°F | Wt 229.0 lb

## 2014-07-02 DIAGNOSIS — Z308 Encounter for other contraceptive management: Secondary | ICD-10-CM

## 2014-07-02 DIAGNOSIS — R000 Tachycardia, unspecified: Secondary | ICD-10-CM | POA: Insufficient documentation

## 2014-07-02 DIAGNOSIS — I10 Essential (primary) hypertension: Secondary | ICD-10-CM

## 2014-07-02 DIAGNOSIS — Z3009 Encounter for other general counseling and advice on contraception: Secondary | ICD-10-CM

## 2014-07-02 MED ORDER — ATENOLOL 25 MG PO TABS: 12.5000 mg | ORAL_TABLET | Freq: Every day | ORAL | Status: DC

## 2014-07-02 NOTE — Assessment & Plan Note (Signed)
Patient will not like to get on OCP which made her BP elevated. Various other options she can try with progesterone only. She will return to discuss with her PCP. She will continue to use condom in the interim. Patient already left prior to request for pregnancy test since she will be on Atenolol. She was contacted by phone and agreed to return today for pregnancy test.

## 2014-07-02 NOTE — Patient Instructions (Signed)
It was nice seeing you today, you BP is slightly elevated. Your HR was initally elevated but came back to normal. I will start you on low dose Metoprolol for this. See your PCP in 4 wks.

## 2014-07-02 NOTE — Assessment & Plan Note (Signed)
BP slightly elevated with more of diastolic hypertension than systolic. She had been on Norvasc which she did not tolerate well. Since her HR increases occasionally, I will have her start Atenolol. Atenolol escribed but on hold at the pharmacy till she returns for pregnancy test.

## 2014-07-02 NOTE — Progress Notes (Signed)
Subjective:     Patient ID: Tonya Owens, female   DOB: 11-26-1980, 34 y.o.   MRN: [id number]  HPI  HTN/Tachycardia: Here for follow up, was on Norvasc but stopped due to s/e, denies chest pain, no SOB,no dizziness. She has had issue with elevated HR for years. She checked her BP at the pharmacy recently and it was elevated. She thought this was related to her birth control which she already discontinued. Birth control management: She stated she was on OCP which made her BP very elevated, she self d/c it. Now using condoms regularly for protection. Her LMP was 06/14/14, regular.   Current Outpatient Prescriptions on File Prior to Visit  Medication Sig Dispense Refill  . albuterol (PROVENTIL HFA;VENTOLIN HFA) 108 (90 BASE) MCG/ACT inhaler Inhale 2 puffs into the lungs every 4 (four) hours as needed for wheezing or shortness of breath. 1 Inhaler 0  . cetirizine (ZYRTEC) 10 MG tablet Take 1 tablet (10 mg total) by mouth daily. (Patient not taking: Reported on 07/02/2014) 30 tablet 11  . fluticasone (FLONASE) 50 MCG/ACT nasal spray Place 2 sprays into both nostrils daily. (Patient not taking: Reported on 05/06/2014) 16 g 5  . LOW-OGESTREL 0.3-30 MG-MCG tablet TAKE 1 TABLET BY MOUTH DAILY (Patient not taking: Reported on 05/06/2014) 28 tablet 0  . montelukast (SINGULAIR) 10 MG tablet Take 1 tablet (10 mg total) by mouth at bedtime. (Patient not taking: Reported on 05/06/2014) 30 tablet 5  . norgestimate-ethinyl estradiol (ORTHO-CYCLEN,SPRINTEC,PREVIFEM) 0.25-35 MG-MCG tablet Take 1 tablet by mouth daily. (Patient not taking: Reported on 05/06/2014) 1 Package 11   No current facility-administered medications on file prior to visit.   No past medical history on file.    Review of Systems  Respiratory: Negative.   Cardiovascular: Negative.   Gastrointestinal: Negative.   Genitourinary: Negative.   All other systems reviewed and are negative.      Filed Vitals:   07/02/14 1027 07/02/14 1043  07/02/14 1045  BP: 140/95 139/94 149/97  Pulse: 110 98 91  Temp: 98.2 F (36.8 C)    TempSrc: Oral    Weight: 229 lb (103.874 kg)      Objective:   Physical Exam  Constitutional: She appears well-developed. No distress.  Cardiovascular: Normal rate, regular rhythm, normal heart sounds and intact distal pulses.   No murmur heard. Pulmonary/Chest: Effort normal and breath sounds normal. No respiratory distress. She has no wheezes.  Abdominal: Soft. Bowel sounds are normal. She exhibits no distension and no mass. There is no tenderness.  Musculoskeletal: Normal range of motion. She exhibits no edema.  Nursing note and vitals reviewed.      Assessment:     HTN/Tachycardia Contraception    Plan:     Check problem list

## 2014-07-02 NOTE — Assessment & Plan Note (Addendum)
HR initially elevated during this visit. Repeat HR normalized. Her vital flow sheet showed previously elevated HR. Plan to start beta blocker. Atenolol on hold at the pharmacy once she returns for pregnancy test. EKG done today looks good.

## 2014-07-04 ENCOUNTER — Other Ambulatory Visit (INDEPENDENT_AMBULATORY_CARE_PROVIDER_SITE_OTHER): Payer: Medicaid Other

## 2014-07-04 DIAGNOSIS — Z308 Encounter for other contraceptive management: Secondary | ICD-10-CM

## 2014-07-04 LAB — POCT URINE PREGNANCY: PREG TEST UR: NEGATIVE

## 2014-07-04 NOTE — Progress Notes (Signed)
U-preg = negative,  Pt was told negative result and can pick up prescription at pharmacy.

## 2014-08-08 ENCOUNTER — Telehealth: Payer: Self-pay | Admitting: Family Medicine

## 2014-08-08 ENCOUNTER — Other Ambulatory Visit: Payer: Self-pay | Admitting: Family Medicine

## 2014-08-08 MED ORDER — ATENOLOL 25 MG PO TABS: 12.5000 mg | ORAL_TABLET | Freq: Every day | ORAL | Status: DC

## 2014-08-08 NOTE — Telephone Encounter (Signed)
Patient need to continue Atenolol for HTN and tachycardia. I will give one time refill, she need to continue f/u and management with her PCP.  Blue team, please let patient know I have e-prescribed her medication to the pharmacy.

## 2014-08-08 NOTE — Telephone Encounter (Signed)
Pt voiced understanding. Jazmin Hartsell,CMA  

## 2014-08-08 NOTE — Telephone Encounter (Signed)
Tonya Owens had to resch appt from this Fri to next, but need you to call her regarding medication she was given.  Need to to know if she should continue taking until her next appt.  There is no refill on these meds.

## 2014-08-09 ENCOUNTER — Ambulatory Visit: Payer: Medicaid Other | Admitting: Family Medicine

## 2014-08-16 ENCOUNTER — Encounter: Payer: Self-pay | Admitting: Family Medicine

## 2014-08-16 ENCOUNTER — Ambulatory Visit (INDEPENDENT_AMBULATORY_CARE_PROVIDER_SITE_OTHER): Payer: Medicaid Other | Admitting: Family Medicine

## 2014-08-16 VITALS — BP 131/84 | HR 99 | Temp 98.5°F | Ht 65.0 in | Wt 227.1 lb

## 2014-08-16 DIAGNOSIS — I10 Essential (primary) hypertension: Secondary | ICD-10-CM | POA: Diagnosis present

## 2014-08-16 DIAGNOSIS — R000 Tachycardia, unspecified: Secondary | ICD-10-CM

## 2014-08-16 DIAGNOSIS — G5600 Carpal tunnel syndrome, unspecified upper limb: Secondary | ICD-10-CM | POA: Diagnosis not present

## 2014-08-16 DIAGNOSIS — F329 Major depressive disorder, single episode, unspecified: Secondary | ICD-10-CM

## 2014-08-16 DIAGNOSIS — F32A Depression, unspecified: Secondary | ICD-10-CM | POA: Insufficient documentation

## 2014-08-16 MED ORDER — IBUPROFEN 600 MG PO TABS: 600.0000 mg | ORAL_TABLET | Freq: Three times a day (TID) | ORAL | Status: DC | PRN

## 2014-08-16 MED ORDER — MELOXICAM 7.5 MG PO TABS: 7.5000 mg | ORAL_TABLET | Freq: Every day | ORAL | Status: DC

## 2014-08-16 NOTE — Assessment & Plan Note (Signed)
HR is optimally controlled.  Continue current dose of Atenolol.

## 2014-08-16 NOTE — Progress Notes (Signed)
Dr. Lum BabeEniola requested a Behavioral Health Consultation to further assess depressive symptoms and to provide any additional recommendations.    Tonya Owens reported her mother was first diagnosed in 2004.  She has had multiple hospitalizations [facility] and frequently goes off her medicine for various reasons which leads to a crisis.  This wears heavingly on Tonya Owens.    Confounding the picture is two losses: one was what sounds like a stillbirth in 2007 and one last year, her grandfather.  Her grandmother had a major hand in raising her and she hasn't been the same since her husband passed so in essence, this is two losses.  She denies ever having counseling and thinks that these events are playing a role in her current distress.  Denies prior mental health diagnosis and treatment.  She describes herself as a "strong woman" who people rely on.  This also means they don't recognize when she needs help.  Discussed the role of a counselor in helping her manage this better.  Also discussed the possible role of medicine.  She does not think it is necessary presently.  Provided three counseling resources including [facility], [facility], and [facility]  I recommended them to her in that order and gave her my direct phone number; if she runs into any difficulty getting services, I asked her to call me.  PHQ-9 of 7 seems consistent with her report of symptoms.  She denies any functional limitations.

## 2014-08-16 NOTE — Progress Notes (Signed)
Subjective:     Patient ID: Tonya Owens, female   DOB: 1980/05/26, 34 y.o.   MRN: [id number]  HPI  HTN/Tachycardia:She is currently on Atenolol 12.5mg  qd, doing well in general, denies any concern, here for follow up. Wrist: C/O B/L wrist pain which has been on going for months but now worsening,she was told in the past that she has carpal tunnel syndrome. 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 She uses Tylenol with no improvement, she also uses Ibuprofen 200 mg as needed but more on right. CTS in the past. Typing a lot, she braids hair. Depression:Patient mentioned she has been feeling depressed lately, she has so many things going on in her life. Her mom is bipolar and she had to take care of her mom. She has so many other family responsibility which she is also involved with. This is overbearing for her. She denies any suicidal ideation.   Current Outpatient Prescriptions on File Prior to Visit  Medication Sig Dispense Refill  . albuterol (PROVENTIL HFA;VENTOLIN HFA) 108 (90 BASE) MCG/ACT inhaler Inhale 2 puffs into the lungs every 4 (four) hours as needed for wheezing or shortness of breath. 1 Inhaler 0  . atenolol (TENORMIN) 25 MG tablet Take 0.5 tablets (12.5 mg total) by mouth daily. 15 tablet 1  . cetirizine (ZYRTEC) 10 MG tablet Take 1 tablet (10 mg total) by mouth daily. (Patient not taking: Reported on 07/02/2014) 30 tablet 11  . fluticasone (FLONASE) 50 MCG/ACT nasal spray Place 2 sprays into both nostrils daily. (Patient not taking: Reported on 05/06/2014) 16 g 5  . LOW-OGESTREL 0.3-30 MG-MCG tablet TAKE 1 TABLET BY MOUTH DAILY (Patient not taking: Reported on 05/06/2014) 28 tablet 0  . montelukast (SINGULAIR) 10 MG tablet Take 1 tablet (10 mg total) by mouth at bedtime. (Patient not taking: Reported on 05/06/2014) 30 tablet 5  . norgestimate-ethinyl estradiol (ORTHO-CYCLEN,SPRINTEC,PREVIFEM) 0.25-35 MG-MCG tablet Take 1 tablet by mouth daily. (Patient not taking: Reported on  05/06/2014) 1 Package 11   No current facility-administered medications on file prior to visit.   No past medical history on file.    Review of Systems  Respiratory: Negative.   Cardiovascular: Negative.   Gastrointestinal: Negative.   Psychiatric/Behavioral: Negative for suicidal ideas, self-injury and agitation.       Depressed.  All other systems reviewed and are negative.  Filed Vitals:   08/16/14 1053  BP: 131/84  Pulse: 99  Temp: 98.5 F (36.9 C)  TempSrc: Oral  Height:  (1.651 m)  Weight: 227 lb 1 oz (102.995 kg)       Objective:   Physical Exam  Constitutional: She is oriented to person, place, and time. She appears well-developed. No distress.  Cardiovascular: Normal rate, regular rhythm and normal heart sounds.   No murmur heard. Pulmonary/Chest: Effort normal. No respiratory distress. She has no wheezes. She exhibits no tenderness.  Abdominal: Soft. Bowel sounds are normal. She exhibits no distension and no mass. There is no tenderness.  Musculoskeletal: Normal range of motion. She exhibits no edema.  Neurological: She is alert and oriented to person, place, and time.  Psychiatric: Her speech is normal and behavior is normal. Judgment and thought content normal. Her mood appears not anxious. Cognition and memory are normal.  Seem worried about something but does not strike me as being so depressed. PHQ9 score of 7 today.  Nursing note and vitals reviewed.      Assessment:     HTN Tachycardia Carpal tunnel  syndrome Depression     Plan:     Check problem list.

## 2014-08-16 NOTE — Assessment & Plan Note (Signed)
Conservative measures. Wrist brace recommended. Also use at night. Mobic prescribed for pain. F/U with PCP soon if no improvement.

## 2014-08-16 NOTE — Patient Instructions (Signed)
Carpal Tunnel Syndrome °The carpal tunnel is an area under the skin of the palm of your hand. Nerves, blood vessels, and strong tissues (tendons) pass through the tunnel. The tunnel can become puffy (swollen). If this happens, a nerve can be pinched in the wrist. This causes carpal tunnel syndrome.  °HOME CARE °· Take all medicine as told by your doctor. °· If you were given a splint, wear it as told. Wear it at night or at times when your doctor told you to. °· Rest your wrist from the activity that causes your pain. °· Put ice on your wrist after long periods of wrist activity. °¨ Put ice in a plastic bag. °¨ Place a towel between your skin and the bag. °¨ Leave the ice on for 15-20 minutes, 03-04 times a day. °· Keep all doctor visits as told. °GET HELP RIGHT AWAY IF: °· You have new problems you cannot explain. °· Your problems get worse and medicine does not help. °MAKE SURE YOU:  °· Understand these instructions. °· Will watch your condition. °· Will get help right away if you are not doing well or get worse. °Document Released: 04/15/2011 Document Revised: 07/19/2011 Document Reviewed: 04/15/2011 °ExitCare® Patient Information ©2015 Exit[employer], LLC. This information is not intended to replace advice given to you by your health care provider. Make sure you discuss any questions you have with your health care provider. ° °

## 2014-08-16 NOTE — Assessment & Plan Note (Signed)
BP is optimally controlled.  Continue current dose of Atenolol.

## 2014-08-16 NOTE — Assessment & Plan Note (Signed)
PHQ9 score of 7 today. This categorizes her as mild depression. This is likely stress induced. She does not strike me to be moderately or severely functionally impaired based on her symptoms. I discussed treatment options with her. I believe she will benefit more from counseling than pharmacologic agent. I recommended that she meet with our onsite psychologist which she agreed with. Psychologic counseling recommended, she will call to schedule appointment. She is stable and not suicidal. F/U with PCP in the next 2-4 wks or sooner if symptoms worsens.

## 2014-08-30 ENCOUNTER — Other Ambulatory Visit: Payer: Self-pay | Admitting: Family Medicine

## 2014-08-30 MED ORDER — ATENOLOL 25 MG PO TABS: 12.5000 mg | ORAL_TABLET | Freq: Every day | ORAL | Status: DC

## 2014-08-30 NOTE — Telephone Encounter (Signed)
Needs refill on blood pressure med

## 2014-10-10 ENCOUNTER — Telehealth: Payer: Self-pay | Admitting: Family Medicine

## 2014-10-10 NOTE — Telephone Encounter (Signed)
As of Mrs. Tonya Owens's last visit on 08/16/2014 with Dr. Lum BabeEniola, Dr. Pascal LuxKane was consulted [facility](Behavioral Health Consultation) for the patient experiencing depression. She was instructed to follow up with her PCP within 2-4 weeks. As of today, 10/10/2014, no such appointment has been made. I attempted to call [name] to check on her and see if we could go ahead and establish that follow up appointment as well as to inquire whether or not she was able to utilize any of the counseling resources given to her. I left a voice message on each number that was listed and asked for her to return my call.   Dr. Pascal LuxKane - This has also been updated in the IC Registry.   Thanks, HoneywellSadie Reynolds, ASA

## 2014-10-11 ENCOUNTER — Encounter: Payer: Self-pay | Admitting: Family Medicine

## 2014-10-11 ENCOUNTER — Ambulatory Visit (INDEPENDENT_AMBULATORY_CARE_PROVIDER_SITE_OTHER): Payer: Medicaid Other | Admitting: Family Medicine

## 2014-10-11 VITALS — BP 132/84 | HR 106 | Temp 98.7°F | Wt 232.0 lb

## 2014-10-11 DIAGNOSIS — L309 Dermatitis, unspecified: Secondary | ICD-10-CM | POA: Insufficient documentation

## 2014-10-11 DIAGNOSIS — F329 Major depressive disorder, single episode, unspecified: Secondary | ICD-10-CM

## 2014-10-11 DIAGNOSIS — R1010 Upper abdominal pain, unspecified: Secondary | ICD-10-CM | POA: Diagnosis not present

## 2014-10-11 DIAGNOSIS — F32A Depression, unspecified: Secondary | ICD-10-CM

## 2014-10-11 MED ORDER — TRIAMCINOLONE ACETONIDE 0.1 % EX CREA: 1.0000 "application " | TOPICAL_CREAM | Freq: Two times a day (BID) | CUTANEOUS | Status: DC

## 2014-10-11 NOTE — Addendum Note (Signed)
Addended by: Reva BoresPRATT, Titianna Loomis S on: 10/11/2014 12:17 PM   Modules accepted: Level of Service

## 2014-10-11 NOTE — Progress Notes (Signed)
    Subjective:    Patient ID: Tonya Owens is a 34 y.o. female presenting with Eczema  on 10/11/2014  HPI: Problem 1 Needs to make an appointment with a therapist. Has a lot of psychosocial stressors and family issues Problem 2 Dry patch on skin. Has tried some triamcinalone. [facility]. Moisturizer use is Suave. Problem 3 Intermittent chest pain. It is under her rib cage. Denies relation to food. Cannot eat hot sauce.   Review of Systems  Constitutional: Negative for fever and chills.  Respiratory: Negative for shortness of breath.   Cardiovascular: Negative for chest pain.  Gastrointestinal: Negative for nausea, vomiting and abdominal pain.  Genitourinary: Negative for dysuria.  Skin: Negative for rash.      Objective:    BP 132/84 mmHg  Pulse 106  Temp(Src) 98.7 F (37.1 C) (Oral)  Wt 232 lb (105.235 kg) Physical Exam  Constitutional: She is oriented to person, place, and time. She appears well-developed and well-nourished. No distress.  HENT:  Head: Normocephalic and atraumatic.  Eyes: No scleral icterus.  Neck: Neck supple.  Cardiovascular: Normal rate.   Pulmonary/Chest: Effort normal.  Abdominal: Soft.  Neurological: She is alert and oriented to person, place, and time.  Skin: Skin is warm and dry.  Psychiatric: She has a normal mood and affect.        Assessment & Plan:   Problem List Items Addressed This Visit      Unprioritized   Abdominal pain    Vs. Lower chest pain. Unclear etiology--keep symptom diary.  If more like gastritis-->could trial H2 blocker or PPI. If more likely costochondritis--may need anti-inflammatory.      Depression    She will seek psychiatric treatment.      Eczema - Primary    Change moisturizer and use topical steroid for a few months to see if that improves lesion.      Relevant Medications   triamcinolone cream (KENALOG) 0.1 %      Return in about 4 weeks (around 11/08/2014).  [name]  S 10/11/2014 11:44 AM

## 2014-10-11 NOTE — Assessment & Plan Note (Signed)
She will seek psychiatric treatment.

## 2014-10-11 NOTE — Patient Instructions (Addendum)
Please switch to Lubriderm or Eucerin only for moisturizer.  Do not take hot showers. Towel dry off by patting dry and apply moisturizer immediately.Eczema Eczema, also called atopic dermatitis, is a skin disorder that causes inflammation of the skin. It causes a red rash and dry, scaly skin. The skin becomes very itchy. Eczema is generally worse during the cooler winter months and often improves with the warmth of summer. Eczema usually starts showing signs in infancy. Some children outgrow eczema, but it may last through adulthood.  CAUSES  The exact cause of eczema is not known, but it appears to run in families. People with eczema often have a family history of eczema, allergies, asthma, or hay fever. Eczema is not contagious. Flare-ups of the condition may be caused by:   Contact with something you are sensitive or allergic to.   Stress. SIGNS AND SYMPTOMS  Dry, scaly skin.   Red, itchy rash.   Itchiness. This may occur before the skin rash and may be very intense.  DIAGNOSIS  The diagnosis of eczema is usually made based on symptoms and medical history. TREATMENT  Eczema cannot be cured, but symptoms usually can be controlled with treatment and other strategies. A treatment plan might include:  Controlling the itching and scratching.   Use over-the-counter antihistamines as directed for itching. This is especially useful at night when the itching tends to be worse.   Use over-the-counter steroid creams as directed for itching.   Avoid scratching. Scratching makes the rash and itching worse. It may also result in a skin infection (impetigo) due to a break in the skin caused by scratching.   Keeping the skin well moisturized with creams every day. This will seal in moisture and help prevent dryness. Lotions that contain alcohol and water should be avoided because they can dry the skin.   Limiting exposure to things that you are sensitive or allergic to (allergens).    Recognizing situations that cause stress.   Developing a plan to manage stress.  HOME CARE INSTRUCTIONS   Only take over-the-counter or prescription medicines as directed by your health care provider.   Do not use anything on the skin without checking with your health care provider.   Keep baths or showers short (5 minutes) in warm (not hot) water. Use mild cleansers for bathing. These should be unscented. You may add nonperfumed bath oil to the bath water. It is best to avoid soap and bubble bath.   Immediately after a bath or shower, when the skin is still damp, apply a moisturizing ointment to the entire body. This ointment should be a petroleum ointment. This will seal in moisture and help prevent dryness. The thicker the ointment, the better. These should be unscented.   Keep fingernails cut short. Children with eczema may need to wear soft gloves or mittens at night after applying an ointment.   Dress in clothes made of cotton or cotton blends. Dress lightly, because heat increases itching.   A child with eczema should stay away from anyone with fever blisters or cold sores. The virus that causes fever blisters (herpes simplex) can cause a serious skin infection in children with eczema. SEEK MEDICAL CARE IF:   Your itching interferes with sleep.   Your rash gets worse or is not better within 1 week after starting treatment.   You see pus or soft yellow scabs in the rash area.   You have a fever.   You have a rash flare-up after contact  with someone who has fever blisters.  Document Released: 04/23/2000 Document Revised: 02/14/2013 Document Reviewed: 11/27/2012 [facility] Patient Information 2015 [place]. This information is not intended to replace advice given to you by your health care provider. Make sure you discuss any questions you have with your health care provider.

## 2014-10-11 NOTE — Assessment & Plan Note (Signed)
Vs. Lower chest pain. Unclear etiology--keep symptom diary.  If more like gastritis-->could trial H2 blocker or PPI. If more likely costochondritis--may need anti-inflammatory.

## 2014-10-11 NOTE — Assessment & Plan Note (Signed)
Change moisturizer and use topical steroid for a few months to see if that improves lesion.

## 2014-10-15 ENCOUNTER — Telehealth: Payer: Self-pay | Admitting: Family Medicine

## 2014-10-15 NOTE — Telephone Encounter (Signed)
Mrs. Tonya Owens came in on Friday, October 11, 2014. I was at the front desk and mentioned my attempt to call her and we spoke about it. She informed me that she had lost her resources, however she had made an appointment to see her PCP on this date; I encouraged her to ask for these resources. After her appointment she ensured me that she had her list to call and that she would seek this treatment, I told her that I would follow up and she appreciated this. I called this morning and left a voicemail on both numbers within her record to give me a call so that we can ensure that she was able to successfully make an appt. Thank you, Dorothey BasemanSadie Reynolds, ASA  Dr. Pascal LuxKane - This is updated in the IC registry

## 2014-12-02 ENCOUNTER — Other Ambulatory Visit (HOSPITAL_COMMUNITY)
Admission: RE | Admit: 2014-12-02 | Discharge: 2014-12-02 | Disposition: A | Discharge status: Home / Self Care | Payer: Medicaid Other | Source: Ambulatory Visit | Attending: Family Medicine | Admitting: Family Medicine

## 2014-12-02 ENCOUNTER — Encounter: Payer: Self-pay | Admitting: Family Medicine

## 2014-12-02 ENCOUNTER — Ambulatory Visit (INDEPENDENT_AMBULATORY_CARE_PROVIDER_SITE_OTHER): Payer: Medicaid Other | Admitting: Family Medicine

## 2014-12-02 VITALS — BP 144/82 | HR 89 | Temp 98.7°F | Ht 65.0 in | Wt 236.9 lb

## 2014-12-02 DIAGNOSIS — Z1151 Encounter for screening for human papillomavirus (HPV): Secondary | ICD-10-CM | POA: Insufficient documentation

## 2014-12-02 DIAGNOSIS — Z01419 Encounter for gynecological examination (general) (routine) without abnormal findings: Secondary | ICD-10-CM | POA: Diagnosis not present

## 2014-12-02 DIAGNOSIS — R87810 Cervical high risk human papillomavirus (HPV) DNA test positive: Secondary | ICD-10-CM | POA: Insufficient documentation

## 2014-12-02 DIAGNOSIS — Z0000 Encounter for general adult medical examination without abnormal findings: Secondary | ICD-10-CM

## 2014-12-02 DIAGNOSIS — Z124 Encounter for screening for malignant neoplasm of cervix: Secondary | ICD-10-CM | POA: Diagnosis not present

## 2014-12-02 DIAGNOSIS — L298 Other pruritus: Secondary | ICD-10-CM

## 2014-12-02 DIAGNOSIS — Z30018 Encounter for initial prescription of other contraceptives: Secondary | ICD-10-CM

## 2014-12-02 DIAGNOSIS — N898 Other specified noninflammatory disorders of vagina: Secondary | ICD-10-CM

## 2014-12-02 DIAGNOSIS — Z01411 Encounter for gynecological examination (general) (routine) with abnormal findings: Secondary | ICD-10-CM | POA: Insufficient documentation

## 2014-12-02 DIAGNOSIS — I10 Essential (primary) hypertension: Secondary | ICD-10-CM | POA: Diagnosis not present

## 2014-12-02 MED ORDER — NORELGESTROMIN-ETH ESTRADIOL 150-35 MCG/24HR TD PTWK: 1.0000 | MEDICATED_PATCH | TRANSDERMAL | Status: DC

## 2014-12-02 NOTE — Progress Notes (Signed)
  Subjective:     Tonya Owens is a 34 y.o. female and is here for a comprehensive physical exam. The patient reports problems - vaginal itching. Regular cycles.  Desires to get back on patch.  Had BP issues with OCP's.  Using condoms.  LMP 7/17.  No sex since then..  History   Social History  . Marital Status: Single    Spouse Name: N/A  . Number of Children: N/A  . Years of Education: N/A   Occupational History  . Not on file.   Social History Main Topics  . Smoking status: Former Smoker -- 0.50 packs/day for 13 years    Types: Cigarettes    Start date: 07/24/1996    Quit date: 06/10/2012  . Smokeless tobacco: Never Used  . Alcohol Use: No  . Drug Use: No  . Sexual Activity: Yes    Birth Control/ Protection: Condom   Other Topics Concern  . Not on file   Social History Narrative   Health Maintenance  Topic Date Due  . INFLUENZA VACCINE  12/09/2014  . PAP SMEAR  01/28/2015  . TETANUS/TDAP  02/13/2022  . HIV Screening  Completed    The following portions of the patient's history were reviewed and updated as appropriate: allergies, current medications, past family history, past medical history, past social history, past surgical history and problem list.  Review of Systems A comprehensive review of systems was negative.   Objective:    BP 144/82 mmHg  Pulse 89  Temp(Src) 98.7 F (37.1 C) (Oral)  Ht  (1.651 m)  Wt 236 lb 14.4 oz (107.457 kg)  BMI 39.42 kg/m2  LMP 11/24/2014 General appearance: alert, cooperative and appears stated age Head: Normocephalic, without obvious abnormality, atraumatic Neck: no adenopathy, supple, symmetrical, trachea midline and thyroid not enlarged, symmetric, no tenderness/mass/nodules Lungs: clear to auscultation bilaterally Breasts: normal appearance, no masses or tenderness Heart: regular rate and rhythm, S1, S2 normal, no murmur, click, rub or gallop Abdomen: soft, non-tender; bowel sounds normal; no masses,  no  organomegaly Pelvic: cervix normal in appearance, external genitalia normal, no adnexal masses or tenderness, no cervical motion tenderness, uterus normal size, shape, and consistency and vagina normal without discharge Extremities: extremities normal, atraumatic, no cyanosis or edema Pulses: 2+ and symmetric Skin: Skin color, texture, turgor normal. No rashes or lesions Lymph nodes: Cervical, supraclavicular, and axillary nodes normal. Neurologic: Grossly normal    Assessment:    Healthy female exam.      Plan:      Problem List Items Addressed This Visit      Unprioritized   Essential hypertension, benign    Keep close eye on BP and patch use--if problem, return to [facility] for f/u       Other Visit Diagnoses    Screening for malignant neoplasm of cervix    -  Primary    Encounter for routine gynecological examination        Relevant Orders    Cytology - PAP    Vaginal itching        Relevant Orders    Wet prep, genital    Encounter for initial prescription of other contraceptives        Relevant Medications    norelgestromin-ethinyl estradiol (ORTHO EVRA) 150-35 MCG/24HR transdermal patch      Return in 1 year (on 12/02/2015).  See After Visit Summary for Counseling Recommendations

## 2014-12-02 NOTE — Assessment & Plan Note (Signed)
Keep close eye on BP and patch use--if problem, return to [facility] for f/u

## 2014-12-02 NOTE — Patient Instructions (Signed)
Contraception Choices Contraception (birth control) is the use of any methods or devices to prevent pregnancy. Below are some methods to help avoid pregnancy. HORMONAL METHODS   Contraceptive implant. This is a thin, plastic tube containing progesterone hormone. It does not contain estrogen hormone. Your health care provider inserts the tube in the inner part of the upper arm. The tube can remain in place for up to 3 years. After 3 years, the implant must be removed. The implant prevents the ovaries from releasing an egg (ovulation), thickens the cervical mucus to prevent sperm from entering the uterus, and thins the lining of the inside of the uterus.  Progesterone-only injections. These injections are given every 3 months by your health care provider to prevent pregnancy. This synthetic progesterone hormone stops the ovaries from releasing eggs. It also thickens cervical mucus and changes the uterine lining. This makes it harder for sperm to survive in the uterus.  Birth control pills. These pills contain estrogen and progesterone hormone. They work by preventing the ovaries from releasing eggs (ovulation). They also cause the cervical mucus to thicken, preventing the sperm from entering the uterus. Birth control pills are prescribed by a health care provider.Birth control pills can also be used to treat heavy periods.  Minipill. This type of birth control pill contains only the progesterone hormone. They are taken every day of each month and must be prescribed by your health care provider.  Birth control patch. The patch contains hormones similar to those in birth control pills. It must be changed once a week and is prescribed by a health care provider.  Vaginal ring. The ring contains hormones similar to those in birth control pills. It is left in the vagina for 3 weeks, removed for 1 week, and then a new one is put back in place. The patient must be comfortable inserting and removing the ring  from the vagina.A health care provider's prescription is necessary.  Emergency contraception. Emergency contraceptives prevent pregnancy after unprotected sexual intercourse. This pill can be taken right after sex or up to 5 days after unprotected sex. It is most effective the sooner you take the pills after having sexual intercourse. Most emergency contraceptive pills are available without a prescription. Check with your pharmacist. Do not use emergency contraception as your only form of birth control. BARRIER METHODS   Female condom. This is a thin sheath (latex or rubber) that is worn over the penis during sexual intercourse. It can be used with spermicide to increase effectiveness.  Female condom. This is a soft, loose-fitting sheath that is put into the vagina before sexual intercourse.  Diaphragm. This is a soft, latex, dome-shaped barrier that must be fitted by a health care provider. It is inserted into the vagina, along with a spermicidal jelly. It is inserted before intercourse. The diaphragm should be left in the vagina for 6 to 8 hours after intercourse.  Cervical cap. This is a round, soft, latex or plastic cup that fits over the cervix and must be fitted by a health care provider. The cap can be left in place for up to 48 hours after intercourse.  Sponge. This is a soft, circular piece of polyurethane foam. The sponge has spermicide in it. It is inserted into the vagina after wetting it and before sexual intercourse.  Spermicides. These are chemicals that kill or block sperm from entering the cervix and uterus. They come in the form of creams, jellies, suppositories, foam, or tablets. They do not require a   prescription. They are inserted into the vagina with an applicator before having sexual intercourse. The process must be repeated every time you have sexual intercourse. INTRAUTERINE CONTRACEPTION  Intrauterine device (IUD). This is a T-shaped device that is put in a woman's uterus  during a menstrual period to prevent pregnancy. There are 2 types:  Copper IUD. This type of IUD is wrapped in copper wire and is placed inside the uterus. Copper makes the uterus and fallopian tubes produce a fluid that kills sperm. It can stay in place for 10 years.  Hormone IUD. This type of IUD contains the hormone progestin (synthetic progesterone). The hormone thickens the cervical mucus and prevents sperm from entering the uterus, and it also thins the uterine lining to prevent implantation of a fertilized egg. The hormone can weaken or kill the sperm that get into the uterus. It can stay in place for 3-5 years, depending on which type of IUD is used. PERMANENT METHODS OF CONTRACEPTION  Female tubal ligation. This is when the woman's fallopian tubes are surgically sealed, tied, or blocked to prevent the egg from traveling to the uterus.  Hysteroscopic sterilization. This involves placing a small coil or insert into each fallopian tube. Your doctor uses a technique called hysteroscopy to do the procedure. The device causes scar tissue to form. This results in permanent blockage of the fallopian tubes, so the sperm cannot fertilize the egg. It takes about 3 months after the procedure for the tubes to become blocked. You must use another form of birth control for these 3 months.  Female sterilization. This is when the female has the tubes that carry sperm tied off (vasectomy).This blocks sperm from entering the vagina during sexual intercourse. After the procedure, the man can still ejaculate fluid (semen). NATURAL PLANNING METHODS  Natural family planning. This is not having sexual intercourse or using a barrier method (condom, diaphragm, cervical cap) on days the woman could become pregnant.  Calendar method. This is keeping track of the length of each menstrual cycle and identifying when you are fertile.  Ovulation method. This is avoiding sexual intercourse during ovulation.  Symptothermal  method. This is avoiding sexual intercourse during ovulation, using a thermometer and ovulation symptoms.  Post-ovulation method. This is timing sexual intercourse after you have ovulated. Regardless of which type or method of contraception you choose, it is important that you use condoms to protect against the transmission of sexually transmitted infections (STIs). Talk with your health care provider about which form of contraception is most appropriate for you. Document Released: 04/26/2005 Document Revised: 05/01/2013 Document Reviewed: 10/19/2012 [employer] Patient Information 2015 [employer]. This information is not intended to replace advice given to you by your health care provider. Make sure you discuss any questions you have with your health care provider. Preventive Care for Adults A healthy lifestyle and preventive care can promote health and wellness. Preventive health guidelines for women include the following key practices.  A routine yearly physical is a good way to check with your health care provider about your health and preventive screening. It is a chance to share any concerns and updates on your health and to receive a thorough exam.  Visit your dentist for a routine exam and preventive care every 6 months. Brush your teeth twice a day and floss once a day. Good oral hygiene prevents tooth decay and gum disease.  The frequency of eye exams is based on your age, health, family medical history, use of contact lenses, and other   factors. Follow your health care provider's recommendations for frequency of eye exams.  Eat a healthy diet. Foods like vegetables, fruits, whole grains, low-fat dairy products, and lean protein foods contain the nutrients you need without too many calories. Decrease your intake of foods high in solid fats, added sugars, and salt. Eat the right amount of calories for you.Get information about a proper diet from your health care provider, if  necessary.  Regular physical exercise is one of the most important things you can do for your health. Most adults should get at least 150 minutes of moderate-intensity exercise (any activity that increases your heart rate and causes you to sweat) each week. In addition, most adults need muscle-strengthening exercises on 2 or more days a week.  Maintain a healthy weight. The body mass index (BMI) is a screening tool to identify possible weight problems. It provides an estimate of body fat based on height and weight. Your health care provider can find your BMI and can help you achieve or maintain a healthy weight.For adults 20 years and older:  A BMI below 18.5 is considered underweight.  A BMI of 18.5 to 24.9 is normal.  A BMI of 25 to 29.9 is considered overweight.  A BMI of 30 and above is considered obese.  Maintain normal blood lipids and cholesterol levels by exercising and minimizing your intake of saturated fat. Eat a balanced diet with plenty of fruit and vegetables. Blood tests for lipids and cholesterol should begin at age 20 and be repeated every 5 years. If your lipid or cholesterol levels are high, you are over 50, or you are at high risk for heart disease, you may need your cholesterol levels checked more frequently.Ongoing high lipid and cholesterol levels should be treated with medicines if diet and exercise are not working.  If you smoke, find out from your health care provider how to quit. If you do not use tobacco, do not start.  Lung cancer screening is recommended for adults aged 55-80 years who are at high risk for developing lung cancer because of a history of smoking. A yearly low-dose CT scan of the lungs is recommended for people who have at least a 30-pack-year history of smoking and are a current smoker or have quit within the past 15 years. A pack year of smoking is smoking an average of 1 pack of cigarettes a day for 1 year (for example: 1 pack a day for 30 years or 2  packs a day for 15 years). Yearly screening should continue until the smoker has stopped smoking for at least 15 years. Yearly screening should be stopped for people who develop a health problem that would prevent them from having lung cancer treatment.  If you are pregnant, do not drink alcohol. If you are breastfeeding, be very cautious about drinking alcohol. If you are not pregnant and choose to drink alcohol, do not have more than 1 drink per day. One drink is considered to be 12 ounces (355 mL) of beer, 5 ounces (148 mL) of wine, or 1.5 ounces (44 mL) of liquor.  Avoid use of street drugs. Do not share needles with anyone. Ask for help if you need support or instructions about stopping the use of drugs.  High blood pressure causes heart disease and increases the risk of stroke. Your blood pressure should be checked at least every 1 to 2 years. Ongoing high blood pressure should be treated with medicines if weight loss and exercise do not work.    If you are 55-79 years old, ask your health care provider if you should take aspirin to prevent strokes.  Diabetes screening involves taking a blood sample to check your fasting blood sugar level. This should be done once every 3 years, after age 45, if you are within normal weight and without risk factors for diabetes. Testing should be considered at a younger age or be carried out more frequently if you are overweight and have at least 1 risk factor for diabetes.  Breast cancer screening is essential preventive care for women. You should practice "breast self-awareness." This means understanding the normal appearance and feel of your breasts and may include breast self-examination. Any changes detected, no matter how small, should be reported to a health care provider. Women in their 20s and 30s should have a clinical breast exam (CBE) by a health care provider as part of a regular health exam every 1 to 3 years. After age 40, women should have a CBE every  year. Starting at age 40, women should consider having a mammogram (breast X-ray test) every year. Women who have a family history of breast cancer should talk to their health care provider about genetic screening. Women at a high risk of breast cancer should talk to their health care providers about having an MRI and a mammogram every year.  Breast cancer gene (BRCA)-related cancer risk assessment is recommended for women who have family members with BRCA-related cancers. BRCA-related cancers include breast, ovarian, tubal, and peritoneal cancers. Having family members with these cancers may be associated with an increased risk for harmful changes (mutations) in the breast cancer genes BRCA1 and BRCA2. Results of the assessment will determine the need for genetic counseling and BRCA1 and BRCA2 testing.  Routine pelvic exams to screen for cancer are no longer recommended for nonpregnant women who are considered low risk for cancer of the pelvic organs (ovaries, uterus, and vagina) and who do not have symptoms. Ask your health care provider if a screening pelvic exam is right for you.  If you have had past treatment for cervical cancer or a condition that could lead to cancer, you need Pap tests and screening for cancer for at least 20 years after your treatment. If Pap tests have been discontinued, your risk factors (such as having a new sexual partner) need to be reassessed to determine if screening should be resumed. Some women have medical problems that increase the chance of getting cervical cancer. In these cases, your health care provider may recommend more frequent screening and Pap tests.  The HPV test is an additional test that may be used for cervical cancer screening. The HPV test looks for the virus that can cause the cell changes on the cervix. The cells collected during the Pap test can be tested for HPV. The HPV test could be used to screen women aged 30 years and older, and should be used in  women of any age who have unclear Pap test results. After the age of 30, women should have HPV testing at the same frequency as a Pap test.  Colorectal cancer can be detected and often prevented. Most routine colorectal cancer screening begins at the age of 50 years and continues through age 75 years. However, your health care provider may recommend screening at an earlier age if you have risk factors for colon cancer. On a yearly basis, your health care provider may provide home test kits to check for hidden blood in the stool. Use of a   small camera at the end of a tube, to directly examine the colon (sigmoidoscopy or colonoscopy), can detect the earliest forms of colorectal cancer. Talk to your health care provider about this at age 50, when routine screening begins. Direct exam of the colon should be repeated every 5-10 years through age 75 years, unless early forms of pre-cancerous polyps or small growths are found.  People who are at an increased risk for hepatitis B should be screened for this virus. You are considered at high risk for hepatitis B if:  You were born in a country where hepatitis B occurs often. Talk with your health care provider about which countries are considered high risk.  Your parents were born in a high-risk country and you have not received a shot to protect against hepatitis B (hepatitis B vaccine).  You have HIV or AIDS.  You use needles to inject street drugs.  You live with, or have sex with, someone who has hepatitis B.  You get hemodialysis treatment.  You take certain medicines for conditions like cancer, organ transplantation, and autoimmune conditions.  Hepatitis C blood testing is recommended for all people born from 1945 through 1965 and any individual with known risks for hepatitis C.  Practice safe sex. Use condoms and avoid high-risk sexual practices to reduce the spread of sexually transmitted infections (STIs). STIs include gonorrhea, chlamydia,  syphilis, trichomonas, herpes, HPV, and human immunodeficiency virus (HIV). Herpes, HIV, and HPV are viral illnesses that have no cure. They can result in disability, cancer, and death.  You should be screened for sexually transmitted illnesses (STIs) including gonorrhea and chlamydia if:  You are sexually active and are younger than 24 years.  You are older than 24 years and your health care provider tells you that you are at risk for this type of infection.  Your sexual activity has changed since you were last screened and you are at an increased risk for chlamydia or gonorrhea. Ask your health care provider if you are at risk.  If you are at risk of being infected with HIV, it is recommended that you take a prescription medicine daily to prevent HIV infection. This is called preexposure prophylaxis (PrEP). You are considered at risk if:  You are a heterosexual woman, are sexually active, and are at increased risk for HIV infection.  You take drugs by injection.  You are sexually active with a partner who has HIV.  Talk with your health care provider about whether you are at high risk of being infected with HIV. If you choose to begin PrEP, you should first be tested for HIV. You should then be tested every 3 months for as long as you are taking PrEP.  Osteoporosis is a disease in which the bones lose minerals and strength with aging. This can result in serious bone fractures or breaks. The risk of osteoporosis can be identified using a bone density scan. Women ages 65 years and over and women at risk for fractures or osteoporosis should discuss screening with their health care providers. Ask your health care provider whether you should take a calcium supplement or vitamin D to reduce the rate of osteoporosis.  Menopause can be associated with physical symptoms and risks. Hormone replacement therapy is available to decrease symptoms and risks. You should talk to your health care provider  about whether hormone replacement therapy is right for you.  Use sunscreen. Apply sunscreen liberally and repeatedly throughout the day. You should seek shade when your shadow   is shorter than you. Protect yourself by wearing long sleeves, pants, a wide-brimmed hat, and sunglasses year round, whenever you are outdoors.  Once a month, do a whole body skin exam, using a mirror to look at the skin on your back. Tell your health care provider of new moles, moles that have irregular borders, moles that are larger than a pencil eraser, or moles that have changed in shape or color.  Stay current with required vaccines (immunizations).  Influenza vaccine. All adults should be immunized every year.  Tetanus, diphtheria, and acellular pertussis (Td, Tdap) vaccine. Pregnant women should receive 1 dose of Tdap vaccine during each pregnancy. The dose should be obtained regardless of the length of time since the last dose. Immunization is preferred during the 27th-36th week of gestation. An adult who has not previously received Tdap or who does not know her vaccine status should receive 1 dose of Tdap. This initial dose should be followed by tetanus and diphtheria toxoids (Td) booster doses every 10 years. Adults with an unknown or incomplete history of completing a 3-dose immunization series with Td-containing vaccines should begin or complete a primary immunization series including a Tdap dose. Adults should receive a Td booster every 10 years.  Varicella vaccine. An adult without evidence of immunity to varicella should receive 2 doses or a second dose if she has previously received 1 dose. Pregnant females who do not have evidence of immunity should receive the first dose after pregnancy. This first dose should be obtained before leaving the health care facility. The second dose should be obtained 4-8 weeks after the first dose.  Human papillomavirus (HPV) vaccine. Females aged 13-26 years who have not received  the vaccine previously should obtain the 3-dose series. The vaccine is not recommended for use in pregnant females. However, pregnancy testing is not needed before receiving a dose. If a female is found to be pregnant after receiving a dose, no treatment is needed. In that case, the remaining doses should be delayed until after the pregnancy. Immunization is recommended for any person with an immunocompromised condition through the age of 26 years if she did not get any or all doses earlier. During the 3-dose series, the second dose should be obtained 4-8 weeks after the first dose. The third dose should be obtained 24 weeks after the first dose and 16 weeks after the second dose.  Zoster vaccine. One dose is recommended for adults aged 60 years or older unless certain conditions are present.  Measles, mumps, and rubella (MMR) vaccine. Adults born before 1957 generally are considered immune to measles and mumps. Adults born in 1957 or later should have 1 or more doses of MMR vaccine unless there is a contraindication to the vaccine or there is laboratory evidence of immunity to each of the three diseases. A routine second dose of MMR vaccine should be obtained at least 28 days after the first dose for students attending postsecondary schools, health care workers, or international travelers. People who received inactivated measles vaccine or an unknown type of measles vaccine during 1963-1967 should receive 2 doses of MMR vaccine. People who received inactivated mumps vaccine or an unknown type of mumps vaccine before 1979 and are at high risk for mumps infection should consider immunization with 2 doses of MMR vaccine. For females of childbearing age, rubella immunity should be determined. If there is no evidence of immunity, females who are not pregnant should be vaccinated. If there is no evidence of immunity, females who   are pregnant should delay immunization until after pregnancy. Unvaccinated health care  workers born before 1957 who lack laboratory evidence of measles, mumps, or rubella immunity or laboratory confirmation of disease should consider measles and mumps immunization with 2 doses of MMR vaccine or rubella immunization with 1 dose of MMR vaccine.  Pneumococcal 13-valent conjugate (PCV13) vaccine. When indicated, a person who is uncertain of her immunization history and has no record of immunization should receive the PCV13 vaccine. An adult aged 19 years or older who has certain medical conditions and has not been previously immunized should receive 1 dose of PCV13 vaccine. This PCV13 should be followed with a dose of pneumococcal polysaccharide (PPSV23) vaccine. The PPSV23 vaccine dose should be obtained at least 8 weeks after the dose of PCV13 vaccine. An adult aged 19 years or older who has certain medical conditions and previously received 1 or more doses of PPSV23 vaccine should receive 1 dose of PCV13. The PCV13 vaccine dose should be obtained 1 or more years after the last PPSV23 vaccine dose.  Pneumococcal polysaccharide (PPSV23) vaccine. When PCV13 is also indicated, PCV13 should be obtained first. All adults aged 65 years and older should be immunized. An adult younger than age 65 years who has certain medical conditions should be immunized. Any person who resides in a nursing home or long-term care facility should be immunized. An adult smoker should be immunized. People with an immunocompromised condition and certain other conditions should receive both PCV13 and PPSV23 vaccines. People with human immunodeficiency virus (HIV) infection should be immunized as soon as possible after diagnosis. Immunization during chemotherapy or radiation therapy should be avoided. Routine use of PPSV23 vaccine is not recommended for American Indians, [place] Natives, or people younger than 65 years unless there are medical conditions that require PPSV23 vaccine. When indicated, people who have unknown  immunization and have no record of immunization should receive PPSV23 vaccine. One-time revaccination 5 years after the first dose of PPSV23 is recommended for people aged 19-64 years who have chronic kidney failure, nephrotic syndrome, asplenia, or immunocompromised conditions. People who received 1-2 doses of PPSV23 before age 65 years should receive another dose of PPSV23 vaccine at age 65 years or later if at least 5 years have passed since the previous dose. Doses of PPSV23 are not needed for people immunized with PPSV23 at or after age 65 years.  Meningococcal vaccine. Adults with asplenia or persistent complement component deficiencies should receive 2 doses of quadrivalent meningococcal conjugate (MenACWY-D) vaccine. The doses should be obtained at least 2 months apart. Microbiologists working with certain meningococcal bacteria, military recruits, people at risk during an outbreak, and people who travel to or live in countries with a high rate of meningitis should be immunized. A first-year college student up through age 21 years who is living in a residence hall should receive a dose if she did not receive a dose on or after her 16th birthday. Adults who have certain high-risk conditions should receive one or more doses of vaccine.  Hepatitis A vaccine. Adults who wish to be protected from this disease, have certain high-risk conditions, work with hepatitis A-infected animals, work in hepatitis A research labs, or travel to or work in countries with a high rate of hepatitis A should be immunized. Adults who were previously unvaccinated and who anticipate close contact with an international adoptee during the first 60 days after arrival in the [place] from a country with a high rate of hepatitis A should be   immunized.  Hepatitis B vaccine. Adults who wish to be protected from this disease, have certain high-risk conditions, may be exposed to blood or other infectious body fluids, are household  contacts or sex partners of hepatitis B positive people, are clients or workers in certain care facilities, or travel to or work in countries with a high rate of hepatitis B should be immunized.  Haemophilus influenzae type b (Hib) vaccine. A previously unvaccinated person with asplenia or sickle cell disease or having a scheduled splenectomy should receive 1 dose of Hib vaccine. Regardless of previous immunization, a recipient of a hematopoietic stem cell transplant should receive a 3-dose series 6-12 months after her successful transplant. Hib vaccine is not recommended for adults with HIV infection. Preventive Services / Frequency Ages 19 to 39 years  Blood pressure check.** / Every 1 to 2 years.  Lipid and cholesterol check.** / Every 5 years beginning at age 20.  Clinical breast exam.** / Every 3 years for women in their 20s and 30s.  BRCA-related cancer risk assessment.** / For women who have family members with a BRCA-related cancer (breast, ovarian, tubal, or peritoneal cancers).  Pap test.** / Every 2 years from ages 21 through 29. Every 3 years starting at age 30 through age 65 or 70 with a history of 3 consecutive normal Pap tests.  HPV screening.** / Every 3 years from ages 30 through ages 65 to 70 with a history of 3 consecutive normal Pap tests.  Hepatitis C blood test.** / For any individual with known risks for hepatitis C.  Skin self-exam. / Monthly.  Influenza vaccine. / Every year.  Tetanus, diphtheria, and acellular pertussis (Tdap, Td) vaccine.** / Consult your health care provider. Pregnant women should receive 1 dose of Tdap vaccine during each pregnancy. 1 dose of Td every 10 years.  Varicella vaccine.** / Consult your health care provider. Pregnant females who do not have evidence of immunity should receive the first dose after pregnancy.  HPV vaccine. / 3 doses over 6 months, if 26 and younger. The vaccine is not recommended for use in pregnant females. However,  pregnancy testing is not needed before receiving a dose.  Measles, mumps, rubella (MMR) vaccine.** / You need at least 1 dose of MMR if you were born in 1957 or later. You may also need a 2nd dose. For females of childbearing age, rubella immunity should be determined. If there is no evidence of immunity, females who are not pregnant should be vaccinated. If there is no evidence of immunity, females who are pregnant should delay immunization until after pregnancy.  Pneumococcal 13-valent conjugate (PCV13) vaccine.** / Consult your health care provider.  Pneumococcal polysaccharide (PPSV23) vaccine.** / 1 to 2 doses if you smoke cigarettes or if you have certain conditions.  Meningococcal vaccine.** / 1 dose if you are age 19 to 21 years and a first-year college student living in a residence hall, or have one of several medical conditions, you need to get vaccinated against meningococcal disease. You may also need additional booster doses.  Hepatitis A vaccine.** / Consult your health care provider.  Hepatitis B vaccine.** / Consult your health care provider.  Haemophilus influenzae type b (Hib) vaccine.** / Consult your health care provider. Ages 40 to 64 years  Blood pressure check.** / Every 1 to 2 years.  Lipid and cholesterol check.** / Every 5 years beginning at age 20 years.  Lung cancer screening. / Every year if you are aged 55-80 years and have a   30-pack-year history of smoking and currently smoke or have quit within the past 15 years. Yearly screening is stopped once you have quit smoking for at least 15 years or develop a health problem that would prevent you from having lung cancer treatment.  Clinical breast exam.** / Every year after age 40 years.  BRCA-related cancer risk assessment.** / For women who have family members with a BRCA-related cancer (breast, ovarian, tubal, or peritoneal cancers).  Mammogram.** / Every year beginning at age 40 years and continuing for as long  as you are in good health. Consult with your health care provider.  Pap test.** / Every 3 years starting at age 30 years through age 65 or 70 years with a history of 3 consecutive normal Pap tests.  HPV screening.** / Every 3 years from ages 30 years through ages 65 to 70 years with a history of 3 consecutive normal Pap tests.  Fecal occult blood test (FOBT) of stool. / Every year beginning at age 50 years and continuing until age 75 years. You may not need to do this test if you get a colonoscopy every 10 years.  Flexible sigmoidoscopy or colonoscopy.** / Every 5 years for a flexible sigmoidoscopy or every 10 years for a colonoscopy beginning at age 50 years and continuing until age 75 years.  Hepatitis C blood test.** / For all people born from 1945 through 1965 and any individual with known risks for hepatitis C.  Skin self-exam. / Monthly.  Influenza vaccine. / Every year.  Tetanus, diphtheria, and acellular pertussis (Tdap/Td) vaccine.** / Consult your health care provider. Pregnant women should receive 1 dose of Tdap vaccine during each pregnancy. 1 dose of Td every 10 years.  Varicella vaccine.** / Consult your health care provider. Pregnant females who do not have evidence of immunity should receive the first dose after pregnancy.  Zoster vaccine.** / 1 dose for adults aged 60 years or older.  Measles, mumps, rubella (MMR) vaccine.** / You need at least 1 dose of MMR if you were born in 1957 or later. You may also need a 2nd dose. For females of childbearing age, rubella immunity should be determined. If there is no evidence of immunity, females who are not pregnant should be vaccinated. If there is no evidence of immunity, females who are pregnant should delay immunization until after pregnancy.  Pneumococcal 13-valent conjugate (PCV13) vaccine.** / Consult your health care provider.  Pneumococcal polysaccharide (PPSV23) vaccine.** / 1 to 2 doses if you smoke cigarettes or if you  have certain conditions.  Meningococcal vaccine.** / Consult your health care provider.  Hepatitis A vaccine.** / Consult your health care provider.  Hepatitis B vaccine.** / Consult your health care provider.  Haemophilus influenzae type b (Hib) vaccine.** / Consult your health care provider. Ages 65 years and over  Blood pressure check.** / Every 1 to 2 years.  Lipid and cholesterol check.** / Every 5 years beginning at age 20 years.  Lung cancer screening. / Every year if you are aged 55-80 years and have a 30-pack-year history of smoking and currently smoke or have quit within the past 15 years. Yearly screening is stopped once you have quit smoking for at least 15 years or develop a health problem that would prevent you from having lung cancer treatment.  Clinical breast exam.** / Every year after age 40 years.  BRCA-related cancer risk assessment.** / For women who have family members with a BRCA-related cancer (breast, ovarian, tubal, or peritoneal cancers).    Mammogram.** / Every year beginning at age 40 years and continuing for as long as you are in good health. Consult with your health care provider.  Pap test.** / Every 3 years starting at age 30 years through age 65 or 70 years with 3 consecutive normal Pap tests. Testing can be stopped between 65 and 70 years with 3 consecutive normal Pap tests and no abnormal Pap or HPV tests in the past 10 years.  HPV screening.** / Every 3 years from ages 30 years through ages 65 or 70 years with a history of 3 consecutive normal Pap tests. Testing can be stopped between 65 and 70 years with 3 consecutive normal Pap tests and no abnormal Pap or HPV tests in the past 10 years.  Fecal occult blood test (FOBT) of stool. / Every year beginning at age 50 years and continuing until age 75 years. You may not need to do this test if you get a colonoscopy every 10 years.  Flexible sigmoidoscopy or colonoscopy.** / Every 5 years for a flexible  sigmoidoscopy or every 10 years for a colonoscopy beginning at age 50 years and continuing until age 75 years.  Hepatitis C blood test.** / For all people born from 1945 through 1965 and any individual with known risks for hepatitis C.  Osteoporosis screening.** / A one-time screening for women ages 65 years and over and women at risk for fractures or osteoporosis.  Skin self-exam. / Monthly.  Influenza vaccine. / Every year.  Tetanus, diphtheria, and acellular pertussis (Tdap/Td) vaccine.** / 1 dose of Td every 10 years.  Varicella vaccine.** / Consult your health care provider.  Zoster vaccine.** / 1 dose for adults aged 60 years or older.  Pneumococcal 13-valent conjugate (PCV13) vaccine.** / Consult your health care provider.  Pneumococcal polysaccharide (PPSV23) vaccine.** / 1 dose for all adults aged 65 years and older.  Meningococcal vaccine.** / Consult your health care provider.  Hepatitis A vaccine.** / Consult your health care provider.  Hepatitis B vaccine.** / Consult your health care provider.  Haemophilus influenzae type b (Hib) vaccine.** / Consult your health care provider. ** Family history and personal history of risk and conditions may change your health care provider's recommendations. Document Released: 06/22/2001 Document Revised: 09/10/2013 Document Reviewed: 09/21/2010 [employer] Patient Information 2015 [employer]. This information is not intended to replace advice given to you by your health care provider. Make sure you discuss any questions you have with your health care provider.  

## 2014-12-03 ENCOUNTER — Telehealth: Payer: Self-pay

## 2014-12-03 LAB — WET PREP, GENITAL
TRICH WET PREP: NONE SEEN
Yeast Wet Prep HPF POC: NONE SEEN

## 2014-12-03 MED ORDER — METRONIDAZOLE 500 MG PO TABS
500.0000 mg | ORAL_TABLET | Freq: Two times a day (BID) | ORAL | Status: DC
Start: 2014-12-03 — End: 2015-04-07

## 2014-12-03 NOTE — Telephone Encounter (Signed)
Called pt and LM that there is an Rx at her [facility] pharmacy off [address] and to please return call to the clinics.

## 2014-12-04 LAB — CYTOLOGY - PAP

## 2014-12-04 NOTE — Telephone Encounter (Signed)
Called patient, no answer- left message stating we are trying to reach you regarding a prescription sent to your pharmacy, please call us back at the clinics

## 2014-12-05 ENCOUNTER — Encounter: Payer: Self-pay | Admitting: Family Medicine

## 2014-12-05 DIAGNOSIS — R87810 Cervical high risk human papillomavirus (HPV) DNA test positive: Secondary | ICD-10-CM | POA: Insufficient documentation

## 2014-12-09 ENCOUNTER — Ambulatory Visit: Payer: Medicaid Other | Admitting: Family Medicine

## 2014-12-18 ENCOUNTER — Encounter: Payer: Self-pay | Admitting: Obstetrics and Gynecology

## 2014-12-18 ENCOUNTER — Ambulatory Visit (INDEPENDENT_AMBULATORY_CARE_PROVIDER_SITE_OTHER): Payer: Medicaid Other | Admitting: Obstetrics and Gynecology

## 2014-12-18 VITALS — BP 134/68 | HR 112 | Temp 98.9°F | Ht 65.0 in | Wt 238.3 lb

## 2014-12-18 DIAGNOSIS — R0600 Dyspnea, unspecified: Secondary | ICD-10-CM | POA: Diagnosis present

## 2014-12-18 DIAGNOSIS — R202 Paresthesia of skin: Secondary | ICD-10-CM

## 2014-12-18 DIAGNOSIS — E669 Obesity, unspecified: Secondary | ICD-10-CM | POA: Diagnosis not present

## 2014-12-18 DIAGNOSIS — R000 Tachycardia, unspecified: Secondary | ICD-10-CM | POA: Diagnosis not present

## 2014-12-18 DIAGNOSIS — R739 Hyperglycemia, unspecified: Secondary | ICD-10-CM

## 2014-12-18 DIAGNOSIS — R7309 Other abnormal glucose: Secondary | ICD-10-CM | POA: Diagnosis not present

## 2014-12-18 LAB — POCT GLYCOSYLATED HEMOGLOBIN (HGB A1C): Hemoglobin A1C: 6

## 2014-12-18 MED ORDER — MONTELUKAST SODIUM 10 MG PO TABS: 10.0000 mg | ORAL_TABLET | Freq: Every day | ORAL | Status: DC

## 2014-12-18 NOTE — Progress Notes (Signed)
    Subjective: Chief Complaint  Patient presents with  . Shortness of Breath    going on for a while now, still occurs  . Tingling    tingling sensation in legs intermittenly and wants to be checked to see if she maybe diabetic as it runs in her family    HPI: Tonya Owens is a 34 y.o. presenting to clinic today to discuss the following:  #SOB: -comes and goes -been a problem for a while -been worked up for it before and was unremarkable (spirometry) -Not taking any meds -Stopped taking BP meds cause thought it was the cause of SOB -notices it more with exertion -has albuterol inhaler that she uses prn  -also believes stress is making it worse Denies CP, n/v, sputum production, fever  #Tingling in extremities: -FMH of diabetes -Wants to get tested to make sure she doesn't have DM -tingling in hands, legs, and feet -feels like they have fallen asleep -does nto last long -comes and goes    ROS in HPI.  Past Medical, Surgical, Social, and Family History Reviewed & Updated per EMR.   Objective: BP 134/68 mmHg  Pulse 112  Temp(Src) 98.9 F (37.2 C) (Oral)  Ht  (1.651 m)  Wt 238 lb 5 oz (108.098 kg)  BMI 39.66 kg/m2  LMP 11/24/2014  Physical Exam General: alert, well-appearing, NAD Neck: supple, full ROM, no thyromegaly.  Lungs: CTAB, normal respiratory effort. No wheezes. Heart: tachycardia, regular rhythm, no m/r/g  Abdomen: Bowel sounds normal; abdomen soft and nontender.  Pulses: DP/PT are full and equal bilaterally. Extremities: No edema Neurologic: No focal deficits, +5 strength globally, sensation grossly intact, gait unremarkable, A&Ox3.  Skin: Intact without suspicious lesions or rashes. Warm and dry. Psych: Anxious, fast speech  Foot Check:  Appearance - no lesions, ulcers or calluses Skin - no unusual pallor or redness Monofilament testing -  Right - Great toe, medial, central, lateral ball and posterior foot intact Left - Great toe,  medial, central, lateral ball and posterior foot intact   Assessment/Plan: Please see problem based Assessment and Plan   Orders Placed This Encounter  Procedures  . TSH  . POCT glycosylated hemoglobin (Hb A1C)    Meds ordered this encounter  Medications  . montelukast (SINGULAIR) 10 MG tablet    Sig: Take 1 tablet (10 mg total) by mouth at bedtime.    Dispense:  30 tablet    Refill:  5     Caryl Ada, DO 12/21/2014, 8:36 PM PGY-2, [facility]

## 2014-12-18 NOTE — Patient Instructions (Addendum)
Please take HR and BP medication I will let you know you results of blood work  Shortness of Breath Shortness of breath means you have trouble breathing. Shortness of breath needs medical care right away. HOME CARE   Do not smoke.  Avoid being around chemicals or things (paint fumes, dust) that may bother your breathing.  Rest as needed. Slowly begin your normal activities.  Only take medicines as told by your doctor.  Keep all doctor visits as told. GET HELP RIGHT AWAY IF:   Your shortness of breath gets worse.  You feel lightheaded, pass out (faint), or have a cough that is not helped by medicine.  You cough up blood.  You have pain with breathing.  You have pain in your chest, arms, shoulders, or belly (abdomen).  You have a fever.  You cannot walk up stairs or exercise the way you normally do.  You do not get better in the time expected.  You have a hard time doing normal activities even with rest.  You have problems with your medicines.  You have any new symptoms. MAKE SURE YOU:  Understand these instructions.  Will watch your condition.  Will get help right away if you are not doing well or get worse. Document Released: 10/13/2007 Document Revised: 05/01/2013 Document Reviewed: 07/12/2011 [facility] Patient Information 2015 [place], [place]. This information is not intended to replace advice given to you by your health care provider. Make sure you discuss any questions you have with your health care provider.

## 2014-12-19 ENCOUNTER — Encounter: Payer: Self-pay | Admitting: Obstetrics and Gynecology

## 2014-12-19 LAB — TSH: TSH: 0.966 u[IU]/mL (ref 0.350–4.500)

## 2014-12-21 DIAGNOSIS — R0600 Dyspnea, unspecified: Secondary | ICD-10-CM | POA: Insufficient documentation

## 2014-12-21 DIAGNOSIS — R202 Paresthesia of skin: Secondary | ICD-10-CM | POA: Insufficient documentation

## 2014-12-21 NOTE — Assessment & Plan Note (Signed)
HR elevated today. Unsure if medication is poorly controlling it due to patient having been off medication for last several days.  -recheck TSH -encouraged patient to retake atenolol to help with HR control -may need more extensive work-up for tachycardia if not well controlled at next visit

## 2014-12-21 NOTE — Assessment & Plan Note (Signed)
Discussed that increased weight may be playing a part in patient's dyspnea. Encouraged her to continue to work on weight loss.

## 2014-12-21 NOTE — Assessment & Plan Note (Signed)
No concern for neuropathy at this time. Unsure of why patient with intermittent tingling sensation. Neuro and foot exam unremarkable.  -continue to monitor -will recheck A1c today; some elevated glucose levels on BMP in past

## 2014-12-21 NOTE — Assessment & Plan Note (Addendum)
Appears multifactorial with possible component of asthma vs obesity vs anxiety. Lung exam was unremarkable and vitals are stable. Has an exertional component. No red flags. Continue to monitor. -continue prn albuterol  -encouraged weight loss -patient to work on ways to help with stress and anxiety -could reconsider retesting lung function if not improved at next visit

## 2015-01-02 ENCOUNTER — Other Ambulatory Visit: Payer: Self-pay | Admitting: Family Medicine

## 2015-01-13 ENCOUNTER — Emergency Department (HOSPITAL_COMMUNITY)
Admission: EM | Admit: 2015-01-13 | Discharge: 2015-01-13 | Disposition: A | Discharge status: Home / Self Care | Payer: No Typology Code available for payment source | Attending: Emergency Medicine | Admitting: Emergency Medicine

## 2015-01-13 ENCOUNTER — Encounter (HOSPITAL_COMMUNITY): Payer: Self-pay | Admitting: Emergency Medicine

## 2015-01-13 DIAGNOSIS — J45909 Unspecified asthma, uncomplicated: Secondary | ICD-10-CM | POA: Insufficient documentation

## 2015-01-13 DIAGNOSIS — Z7951 Long term (current) use of inhaled steroids: Secondary | ICD-10-CM | POA: Diagnosis not present

## 2015-01-13 DIAGNOSIS — Z87891 Personal history of nicotine dependence: Secondary | ICD-10-CM | POA: Diagnosis not present

## 2015-01-13 DIAGNOSIS — Y998 Other external cause status: Secondary | ICD-10-CM | POA: Insufficient documentation

## 2015-01-13 DIAGNOSIS — Y92410 Unspecified street and highway as the place of occurrence of the external cause: Secondary | ICD-10-CM | POA: Insufficient documentation

## 2015-01-13 DIAGNOSIS — Z79899 Other long term (current) drug therapy: Secondary | ICD-10-CM | POA: Insufficient documentation

## 2015-01-13 DIAGNOSIS — S3992XA Unspecified injury of lower back, initial encounter: Secondary | ICD-10-CM | POA: Diagnosis not present

## 2015-01-13 DIAGNOSIS — Y9389 Activity, other specified: Secondary | ICD-10-CM | POA: Diagnosis not present

## 2015-01-13 DIAGNOSIS — M7918 Myalgia, other site: Secondary | ICD-10-CM

## 2015-01-13 DIAGNOSIS — S199XXA Unspecified injury of neck, initial encounter: Secondary | ICD-10-CM | POA: Diagnosis present

## 2015-01-13 DIAGNOSIS — I10 Essential (primary) hypertension: Secondary | ICD-10-CM | POA: Diagnosis not present

## 2015-01-13 MED ORDER — METHOCARBAMOL 500 MG PO TABS: 500.0000 mg | ORAL_TABLET | Freq: Two times a day (BID) | ORAL | Status: DC

## 2015-01-13 MED ORDER — HYDROCODONE-ACETAMINOPHEN 5-325 MG PO TABS: 2.0000 | ORAL_TABLET | ORAL | Status: DC | PRN

## 2015-01-13 MED ORDER — IBUPROFEN 400 MG PO TABS
600.0000 mg | ORAL_TABLET | Freq: Once | ORAL | Status: AC
  Administered 2015-01-13: 600 mg via ORAL
  Filled 2015-01-13 (×2): qty 1

## 2015-01-13 MED ORDER — NAPROXEN 500 MG PO TABS: 500.0000 mg | ORAL_TABLET | Freq: Two times a day (BID) | ORAL | Status: DC
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
## 2015-01-17 ENCOUNTER — Ambulatory Visit: Payer: Medicaid Other | Admitting: Family Medicine

## 2015-01-23 ENCOUNTER — Ambulatory Visit (INDEPENDENT_AMBULATORY_CARE_PROVIDER_SITE_OTHER): Payer: Medicaid Other | Admitting: Family Medicine

## 2015-01-23 ENCOUNTER — Encounter: Payer: Self-pay | Admitting: Family Medicine

## 2015-01-23 ENCOUNTER — Telehealth: Payer: Self-pay | Admitting: Family Medicine

## 2015-01-23 VITALS — BP 145/93 | HR 104 | Temp 98.6°F | Ht 65.0 in | Wt 234.0 lb

## 2015-01-23 DIAGNOSIS — R399 Unspecified symptoms and signs involving the genitourinary system: Secondary | ICD-10-CM | POA: Diagnosis present

## 2015-01-23 DIAGNOSIS — S39012A Strain of muscle, fascia and tendon of lower back, initial encounter: Secondary | ICD-10-CM | POA: Diagnosis not present

## 2015-01-23 DIAGNOSIS — K009 Disorder of tooth development, unspecified: Secondary | ICD-10-CM

## 2015-01-23 DIAGNOSIS — A599 Trichomoniasis, unspecified: Secondary | ICD-10-CM | POA: Diagnosis not present

## 2015-01-23 LAB — POCT URINALYSIS DIPSTICK
BILIRUBIN UA: NEGATIVE
GLUCOSE UA: NEGATIVE
KETONES UA: NEGATIVE
Nitrite, UA: NEGATIVE
PH UA: 6
Protein, UA: NEGATIVE
RBC UA: NEGATIVE
Spec Grav, UA: 1.02
Urobilinogen, UA: 0.2

## 2015-01-23 LAB — POCT UA - MICROSCOPIC ONLY: TRICHOMONAS UA: POSITIVE

## 2015-01-23 MED ORDER — TRAMADOL HCL 50 MG PO TABS: 50.0000 mg | ORAL_TABLET | Freq: Three times a day (TID) | ORAL | Status: DC | PRN

## 2015-01-23 MED ORDER — METRONIDAZOLE 500 MG PO TABS: 500.0000 mg | ORAL_TABLET | Freq: Two times a day (BID) | ORAL | Status: DC

## 2015-01-23 NOTE — Patient Instructions (Addendum)
Thanks for coming in today.   We will give you a prescription for tramadol today.   If you have any of the warning symptoms that we talked about, then please come back to see [place] or go to the ED.   You should have some mild pain over the next week, but I expect this to resolve.   Take the antibiotic as prescribed twice daily for 10 days.    Thanks for letting us take care of you!  Sincerely, Devota Pace, MD Family Medicine - PGY 2

## 2015-01-23 NOTE — Telephone Encounter (Signed)
Will forward to MD. Tonya Owens,CMA  

## 2015-01-23 NOTE — Telephone Encounter (Signed)
Patient asks PCP for a dentist referral. Please, follow up.

## 2015-02-03 ENCOUNTER — Other Ambulatory Visit: Payer: Self-pay | Admitting: Family Medicine
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
## 2015-03-06 ENCOUNTER — Other Ambulatory Visit: Payer: Self-pay | Admitting: Family Medicine

## 2015-04-07 ENCOUNTER — Encounter: Payer: Self-pay | Admitting: Family Medicine

## 2015-04-07 ENCOUNTER — Ambulatory Visit (INDEPENDENT_AMBULATORY_CARE_PROVIDER_SITE_OTHER): Payer: Medicaid Other | Admitting: Family Medicine

## 2015-04-07 VITALS — BP 134/78 | HR 80 | Wt 240.0 lb

## 2015-04-07 DIAGNOSIS — I10 Essential (primary) hypertension: Secondary | ICD-10-CM

## 2015-04-07 MED ORDER — ALBUTEROL SULFATE HFA 108 (90 BASE) MCG/ACT IN AERS: 2.0000 | INHALATION_SPRAY | RESPIRATORY_TRACT | Status: DC | PRN

## 2015-04-07 MED ORDER — AMLODIPINE BESYLATE 5 MG PO TABS: 5.0000 mg | ORAL_TABLET | Freq: Every day | ORAL | Status: DC

## 2015-04-07 NOTE — Assessment & Plan Note (Signed)
Stopped atenolol due to side effects (worsened her breathing). Actually at goal in clinic today. Recommended she do another trial of amlodipine (reported in the past "jittery" with taking it although she does not recall this). Recommended home BP monitoring and if not under control to come back to clinic sooner, otherwise 3 month follow up.

## 2015-04-07 NOTE — Progress Notes (Signed)
   Subjective:    Patient ID: Tonya Owens, female    DOB: Sep 27, 1980, 34 y.o.   MRN: [id number]  HPI  Patient presents for Same Day Appointment  CC: medicine issue  # Hypertension:  Started on atenolol recently for BP and slowing her HR down  She says this was causing her breathing issues every time she took it, so she stopped 5 days ago  Reports since stopping breathing issue resolved  She does not recall ever taking amlodipine ROS: no SOB, no CP, no HA  Social Hx: former smoker  Review of Systems   See HPI for ROS. All other systems reviewed and are negative.  Past medical history, surgical, family, and social history reviewed and updated in the EMR as appropriate.  Objective:  BP 134/78 mmHg  Pulse 80  Wt 240 lb (108.863 kg)  SpO2 99%  LMP 04/05/2015 (Exact Date) Vitals and nursing note reviewed  General: NAD CV: RRR, normal s1s2, no murmurs, rubs or gallop. 2+ radial pulses bilaterally  Resp: clear to auscultation bilaterally, normal effort Ext: no edema Neuro: alert and oriented Psych: normal thought content and speech  Assessment & Plan:  Essential hypertension, benign Stopped atenolol due to side effects (worsened her breathing). Actually at goal in clinic today. Recommended she do another trial of amlodipine (reported in the past "jittery" with taking it although she does not recall this). Recommended home BP monitoring and if not under control to come back to clinic sooner, otherwise 3 month follow up.

## 2015-04-07 NOTE — Patient Instructions (Addendum)
Blood pressure: Want to be less than 140/90  Check your blood pressure at the pharmacy every other week after starting amlodipine, if well controlled below those numbers then once a month.  If you notice BP getting over 140/90, need to come back to the clinic.

## 2015-04-17 ENCOUNTER — Ambulatory Visit (INDEPENDENT_AMBULATORY_CARE_PROVIDER_SITE_OTHER): Payer: Medicaid Other | Admitting: Family Medicine

## 2015-04-17 ENCOUNTER — Encounter: Payer: Self-pay | Admitting: Family Medicine

## 2015-04-17 VITALS — BP 138/70 | HR 101 | Temp 98.6°F | Wt 239.0 lb

## 2015-04-17 DIAGNOSIS — R0600 Dyspnea, unspecified: Secondary | ICD-10-CM | POA: Diagnosis present

## 2015-04-17 DIAGNOSIS — L509 Urticaria, unspecified: Secondary | ICD-10-CM | POA: Diagnosis not present

## 2015-04-17 DIAGNOSIS — I10 Essential (primary) hypertension: Secondary | ICD-10-CM

## 2015-04-17 MED ORDER — CETIRIZINE HCL 10 MG PO TABS: 10.0000 mg | ORAL_TABLET | Freq: Every day | ORAL | Status: DC

## 2015-04-17 NOTE — Progress Notes (Signed)
    Subjective:    Patient ID: Tonya Owens is a 34 y.o. female presenting with Medication Dose Change  on 04/17/2015  HPI: Started back on amlodipine and noted feet were tingling and leg swelling so she stopped this medication. Had previously stopped the atenolol which cased her to be SOB. Wonders if she has a bite on her skin--noted new and itching and fells "whelped up". Did change her soap.  Review of Systems  Constitutional: Negative for fever and chills.  Respiratory: Negative for shortness of breath.   Cardiovascular: Negative for chest pain.  Gastrointestinal: Negative for nausea, vomiting and abdominal pain.  Genitourinary: Negative for dysuria.  Skin: Negative for rash.      Objective:    BP 138/70 mmHg  Pulse 101  Temp(Src) 98.6 F (37 C) (Oral)  Wt 239 lb (108.41 kg)  SpO2 98%  LMP 04/05/2015 (Exact Date) Physical Exam  Constitutional: She is oriented to person, place, and time. She appears well-developed and well-nourished. No distress.  HENT:  Head: Normocephalic and atraumatic.  Eyes: No scleral icterus.  Neck: Neck supple.  Cardiovascular: Normal rate.   Pulmonary/Chest: Effort normal.  Abdominal: Soft.  Neurological: She is alert and oriented to person, place, and time.  Skin: Skin is warm and dry.  Multiple urticaria noted  Psychiatric: She has a normal mood and affect.        Assessment & Plan:   Problem List Items Addressed This Visit      Unprioritized   Essential hypertension, benign    OK without any meds--still trying to follow diet and weight loss.      Dyspnea - Primary    Improved with change in medication       Other Visit Diagnoses    Hives        Trial of Zyrtec, twice daily    Relevant Medications    cetirizine (ZYRTEC) 10 MG tablet        Return in about 3 months (around 07/16/2015) for a follow-up.  Lawrnce Reyez S 04/17/2015 10:50 AM

## 2015-04-17 NOTE — Assessment & Plan Note (Signed)
Improved with change in medication

## 2015-04-17 NOTE — Patient Instructions (Signed)
Hives Hives are itchy, red, swollen areas of the skin. They can vary in size and location on your body. Hives can come and go for hours or several days (acute hives) or for several weeks (chronic hives). Hives do not spread from person to person (noncontagious). They may get worse with scratching, exercise, and emotional stress. CAUSES   Allergic reaction to food, additives, or drugs.  Infections, including the common cold.  Illness, such as vasculitis, lupus, or thyroid disease.  Exposure to sunlight, heat, or cold.  Exercise.  Stress.  Contact with chemicals. SYMPTOMS   Red or white swollen patches on the skin. The patches may change size, shape, and location quickly and repeatedly.  Itching.  Swelling of the hands, feet, and face. This may occur if hives develop deeper in the skin. DIAGNOSIS  Your caregiver can usually tell what is wrong by performing a physical exam. Skin or blood tests may also be done to determine the cause of your hives. In some cases, the cause cannot be determined. TREATMENT  Mild cases usually get better with medicines such as antihistamines. Severe cases may require an emergency epinephrine injection. If the cause of your hives is known, treatment includes avoiding that trigger.  HOME CARE INSTRUCTIONS   Avoid causes that trigger your hives.  Take antihistamines as directed by your caregiver to reduce the severity of your hives. Non-sedating or low-sedating antihistamines are usually recommended. Do not drive while taking an antihistamine.  Take any other medicines prescribed for itching as directed by your caregiver.  Wear loose-fitting clothing.  Keep all follow-up appointments as directed by your caregiver. SEEK MEDICAL CARE IF:   You have persistent or severe itching that is not relieved with medicine.  You have painful or swollen joints. SEEK IMMEDIATE MEDICAL CARE IF:   You have a fever.  Your tongue or lips are swollen.  You have  trouble breathing or swallowing.  You feel tightness in the throat or chest.  You have abdominal pain. These problems may be the first sign of a life-threatening allergic reaction. Call your local emergency services (911 in [place] MAKE SURE YOU:   Understand these instructions.  Will watch your condition.  Will get help right away if you are not doing well or get worse.   This information is not intended to replace advice given to you by your health care provider. Make sure you discuss any questions you have with your health care provider.   Document Released: 04/26/2005 Document Revised: 05/01/2013 Document Reviewed: 07/20/2011 Elsevier Interactive Patient Education 2016 [employer] DASH Eating Plan DASH stands for "Dietary Approaches to Stop Hypertension." The DASH eating plan is a healthy eating plan that has been shown to reduce high blood pressure (hypertension). Additional health benefits may include reducing the risk of type 2 diabetes mellitus, heart disease, and stroke. The DASH eating plan may also help with weight loss. WHAT DO I NEED TO KNOW ABOUT THE DASH EATING PLAN? For the DASH eating plan, you will follow these general guidelines:  Choose foods with a percent daily value for sodium of less than 5% (as listed on the food label).  Use salt-free seasonings or herbs instead of table salt or sea salt.  Check with your health care provider or pharmacist before using salt substitutes.  Eat lower-sodium products, often labeled as "lower sodium" or "no salt added."  Eat fresh foods.  Eat more vegetables, fruits, and low-fat dairy products.  Choose whole grains. Look for the word "whole"  as the first word in the ingredient list.  Choose fish and skinless chicken or [place] more often than red meat. Limit fish, poultry, and meat to 6 oz (170 g) each day.  Limit sweets, desserts, sugars, and sugary drinks.  Choose heart-healthy fats.  Limit cheese to 1 oz (28 g) per  day.  Eat more home-cooked food and less restaurant, buffet, and fast food.  Limit fried foods.  Cook foods using methods other than frying.  Limit canned vegetables. If you do use them, rinse them well to decrease the sodium.  When eating at a restaurant, ask that your food be prepared with less salt, or no salt if possible. WHAT FOODS CAN I EAT? Seek help from a dietitian for individual calorie needs. Grains Whole grain or whole wheat bread. Brown rice. Whole grain or whole wheat pasta. Quinoa, bulgur, and whole grain cereals. Low-sodium cereals. Corn or whole wheat flour tortillas. Whole grain cornbread. Whole grain crackers. Low-sodium crackers. Vegetables Fresh or frozen vegetables (raw, steamed, roasted, or grilled). Low-sodium or reduced-sodium tomato and vegetable juices. Low-sodium or reduced-sodium tomato sauce and paste. Low-sodium or reduced-sodium canned vegetables.  Fruits All fresh, canned (in natural juice), or frozen fruits. Meat and Other Protein Products Ground beef (85% or leaner), grass-fed beef, or beef trimmed of fat. Skinless chicken or [place]. Ground chicken or [place]. Pork trimmed of fat. All fish and seafood. Eggs. Dried beans, peas, or lentils. Unsalted nuts and seeds. Unsalted canned beans. Dairy Low-fat dairy products, such as skim or 1% milk, 2% or reduced-fat cheeses, low-fat ricotta or cottage cheese, or plain low-fat yogurt. Low-sodium or reduced-sodium cheeses. Fats and Oils Tub margarines without trans fats. Light or reduced-fat mayonnaise and salad dressings (reduced sodium). Avocado. Safflower, olive, or canola oils. Natural peanut or almond butter. Other Unsalted popcorn and pretzels. The items listed above may not be a complete list of recommended foods or beverages. Contact your dietitian for more options. WHAT FOODS ARE NOT RECOMMENDED? Grains White bread. White pasta. White rice. Refined cornbread. Bagels and croissants. Crackers that contain  trans fat. Vegetables Creamed or fried vegetables. Vegetables in a cheese sauce. Regular canned vegetables. Regular canned tomato sauce and paste. Regular tomato and vegetable juices. Fruits Dried fruits. Canned fruit in light or heavy syrup. Fruit juice. Meat and Other Protein Products Fatty cuts of meat. Ribs, chicken wings, bacon, sausage, bologna, salami, chitterlings, fatback, hot dogs, bratwurst, and packaged luncheon meats. Salted nuts and seeds. Canned beans with salt. Dairy Whole or 2% milk, cream, half-and-half, and cream cheese. Whole-fat or sweetened yogurt. Full-fat cheeses or blue cheese. Nondairy creamers and whipped toppings. Processed cheese, cheese spreads, or cheese curds. Condiments Onion and garlic salt, seasoned salt, table salt, and sea salt. Canned and packaged gravies. Worcestershire sauce. Tartar sauce. Barbecue sauce. Teriyaki sauce. Soy sauce, including reduced sodium. Steak sauce. Fish sauce. Oyster sauce. Cocktail sauce. Horseradish. Ketchup and mustard. Meat flavorings and tenderizers. Bouillon cubes. Hot sauce. Tabasco sauce. Marinades. Taco seasonings. Relishes. Fats and Oils Butter, stick margarine, lard, shortening, ghee, and bacon fat. Coconut, palm kernel, or palm oils. Regular salad dressings. Other Pickles and olives. Salted popcorn and pretzels. The items listed above may not be a complete list of foods and beverages to avoid. Contact your dietitian for more information. WHERE CAN I FIND MORE INFORMATION? National Heart, Lung, and Blood Institute: CableProm[employer].itwww.nhlbi.nih.gov/health/health-topics/topics/dash/   This information is not intended to replace advice given to you by your health care provider. Make sure you discuss any questions  you have with your health care provider.   Document Released: 04/15/2011 Document Revised: 05/17/2014 Document Reviewed: 02/28/2013 Elsevier Interactive Patient Education [employer]

## 2015-04-17 NOTE — Assessment & Plan Note (Signed)
OK without any meds--still trying to follow diet and weight loss.

## 2015-09-15 ENCOUNTER — Ambulatory Visit: Payer: Medicaid Other | Admitting: Obstetrics and Gynecology

## 2015-09-23 ENCOUNTER — Encounter: Payer: Self-pay | Admitting: Obstetrics and Gynecology

## 2015-09-23 ENCOUNTER — Ambulatory Visit (INDEPENDENT_AMBULATORY_CARE_PROVIDER_SITE_OTHER): Payer: Medicaid Other | Admitting: Obstetrics and Gynecology

## 2015-09-23 VITALS — BP 138/82 | HR 121 | Temp 98.6°F | Wt 239.0 lb

## 2015-09-23 DIAGNOSIS — I10 Essential (primary) hypertension: Secondary | ICD-10-CM | POA: Diagnosis not present

## 2015-09-23 DIAGNOSIS — R0600 Dyspnea, unspecified: Secondary | ICD-10-CM | POA: Diagnosis not present

## 2015-09-23 DIAGNOSIS — F419 Anxiety disorder, unspecified: Secondary | ICD-10-CM

## 2015-09-23 DIAGNOSIS — R000 Tachycardia, unspecified: Secondary | ICD-10-CM

## 2015-09-23 MED ORDER — METOPROLOL SUCCINATE ER 25 MG PO TB24: 25.0000 mg | ORAL_TABLET | Freq: Every day | ORAL | Status: DC

## 2015-09-23 NOTE — Progress Notes (Signed)
     Subjective: Chief Complaint  Patient presents with  . Blood Pressure Check     HPI: Doretha ImusLatia I Balsam is a 35 y.o. presenting to clinic today to discuss the following:  #Hypertension Here for follow-up on BP Currently not on medication BP within upper limits of normal today ROS: Denies dizziness, visual changes, nausea, vomiting, chest pain, abdominal pain  #Dyspnea -breathing is the same -states she stopped taking albuterol because it was recalled -dyspnea is intermittent -could be due to anxiety per patient  #Headache Headache started 4 days ago Pain is pounding like something "hitting" in head Location: bilateral front Medications tried: ibuprofen with relief Previous similar headaches: yes No h/io migraines  Symptoms Nose congestion stuffiness:  no Nausea vomiting: no Photophobia: no Noise sensitivity: no Double vision or loss of vision: no  #Anxiety Continues to suffer from anxiety Also with PMH of depression Not on any medications and currently not being seen by therapist [facility] but not working [employer] like she gets anxiety attacks   ROS noted in HPI.  Past Medical, Surgical, Social, and Family History Reviewed & Updated per EMR.   Objective: BP 138/82 mmHg  Pulse 121  Temp(Src) 98.6 F (37 C) (Oral)  Wt 239 lb (108.41 kg)  LMP 09/12/2015 Vitals and nursing notes reviewed  Physical Exam  Constitutional: She is well-developed, well-nourished, and in no distress.  HENT:  Mouth/Throat: Oropharynx is clear and moist.  No sinus tenderness  Cardiovascular: Regular rhythm.  Tachycardia present.   Pulmonary/Chest: Effort normal and breath sounds normal. She has no wheezes. She has no rales.  Neurological:  No focal deficits.  Psychiatric: Her mood appears anxious. She expresses no homicidal and no suicidal ideation.    Assessment/Plan: Please see problem based Assessment and Plan   Meds ordered  this encounter  Medications  . metoprolol succinate (TOPROL-XL) 25 MG 24 hr tablet    Sig: Take 1 tablet (25 mg total) by mouth daily.    Dispense:  30 tablet    Refill:  0     Caryl AdaJazma Anton Cheramie, DO 09/23/2015, 11:42 AM PGY-2, [facility]

## 2015-09-23 NOTE — Patient Instructions (Signed)
I am pleased to hear that things are going well for you.  Here are some of the things we discussed today: -For your heart rate starting a medicaiton called metoprolol -continue to use albuterol for shortness of breath as needed -call a dentist to schedule appointment no need for referral -[facility] follow-up with them as needed for your anxiety  Please schedule a follow-up appointment for 3 weeks to see how medication is working for your heart rate   Thanks for allowing me to be a part of your care! Dr. Doroteo GlassmanPhelps  Tension Headache A tension headache is pain, pressure, or aching that is felt over the front and sides of your head. These headaches can last from 30 minutes to several days. HOME CARE Managing Pain  Take over-the-counter and prescription medicines only as told by your doctor.  Lie down in a dark, quiet room when you have a headache.  If directed, apply ice to your head and neck area:  Put ice in a plastic bag.  Place a towel between your skin and the bag.  Leave the ice on for 20 minutes, 2-3 times per day.  Use a heating pad or a hot shower to apply heat to your head and neck area as told by your doctor. Eating and Drinking  Eat meals on a regular schedule.  Do not drink a lot of alcohol.  Do not use a lot of caffeine, or stop using caffeine. General Instructions  Keep all follow-up visits as told by your doctor. This is important.  Keep a journal to find out if certain things bring on headaches. For example, write down:  What you eat and drink.  How much sleep you get.  Any change to your diet or medicines.  Try getting a massage, or doing other things that help you to relax.  Lessen stress.  Sit up straight. Do not tighten (tense) your muscles.  Do not use tobacco products. This includes cigarettes, chewing tobacco, or e-cigarettes. If you need help quitting, ask your doctor.  Exercise regularly as told by your doctor.  Get  enough sleep. This may mean 7-9 hours of sleep. GET HELP IF:  Your symptoms are not helped by medicine.  You have a headache that feels different from your usual headache.  You feel sick to your stomach (nauseous) or you throw up (vomit).  You have a fever. GET HELP RIGHT AWAY IF:  Your headache becomes very bad.  You keep throwing up.  You have a stiff neck.  You have trouble seeing.  You have trouble speaking.  You have pain in your eye or ear.  Your muscles are weak or you lose muscle control.  You lose your balance or you have trouble walking.  You feel like you will pass out (faint) or you pass out.  You have confusion.   This information is not intended to replace advice given to you by your health care provider. Make sure you discuss any questions you have with your health care provider.   Document Released: 07/21/2009 Document Revised: 01/15/2015 Document Reviewed: 08/19/2014 Elsevier Interactive Patient Education [employer]

## 2015-09-25 DIAGNOSIS — F419 Anxiety disorder, unspecified: Secondary | ICD-10-CM | POA: Insufficient documentation

## 2015-09-25 NOTE — Assessment & Plan Note (Addendum)
Continues to have tachycardia which is now worse since patient has stopped her atenlol. Discontinued medication due to side effects. Patient willing to restart another BB today. Metoprolol given. Needs follow-up in 3 weeks. Reviewed prior labs and TSH was normal. No signs of irregular heart rhythm. Believe component of tachycardia also her anxiety.

## 2015-09-25 NOTE — Assessment & Plan Note (Signed)
Not currently on medication. Lon-standing stable condition at this point. Has had side effects previously on certain medications. Continue without medication at this time. BP within upper normal limits today. Encouraged continued lifestyle changes to maintain BP.

## 2015-09-25 NOTE — Assessment & Plan Note (Signed)
Waxes and wanes. No diagnosis of lung disease. Will leave to PCP if they want to pursue work-up. Encouraged patient to use albuterol prn (reviewed the recall with patient how it was only a certain few lots not the medication as whole).

## 2015-09-25 NOTE — Assessment & Plan Note (Signed)
Patient anxious at today's visit. States she has diagnosis of anxiety. Initiated behavorial health clinic evaluation [facility]  [facility]. Please see his separate assessment.

## 2015-10-20 ENCOUNTER — Ambulatory Visit (INDEPENDENT_AMBULATORY_CARE_PROVIDER_SITE_OTHER): Payer: Medicaid Other | Admitting: Obstetrics and Gynecology

## 2015-10-20 ENCOUNTER — Encounter: Payer: Self-pay | Admitting: Obstetrics and Gynecology

## 2015-10-20 VITALS — BP 124/86 | HR 90 | Temp 99.0°F | Wt 236.0 lb

## 2015-10-20 DIAGNOSIS — F419 Anxiety disorder, unspecified: Secondary | ICD-10-CM

## 2015-10-20 DIAGNOSIS — R000 Tachycardia, unspecified: Secondary | ICD-10-CM

## 2015-10-20 DIAGNOSIS — R0600 Dyspnea, unspecified: Secondary | ICD-10-CM

## 2015-10-20 NOTE — Patient Instructions (Signed)
Glad to hear you feel better since the last time I saw you Continue taking medication. Your heart rate has improved.  [facility]   Follow-up with PCP as indicated

## 2015-10-20 NOTE — Progress Notes (Signed)
     Subjective: Chief Complaint  Patient presents with  . Follow-up     HPI: Tonya Owens is a 35 y.o. presenting to clinic today to discuss the following:  #Tachycardia -started taking metoprolol and reports compliance with medication -Denies any side effects -no chest pain, dyspnea, palpitations  #Anxiety -discussed with behavorial health at last appointment -has not followed up but would like to schedule an appointment -not on medications for anxiety  #Dyspnea -Improved -Only had to use albuterol inhaler once last week -denies congestion, fever   Health Maintenance: Needs physcial     ROS noted in HPI.  Past Medical, Surgical, Social, and Family History Reviewed & Updated per EMR. Smoking status - Former Smoker   Objective: BP 124/86 mmHg  Pulse 90  Temp(Src) 99 F (37.2 C) (Oral)  Wt 236 lb (107.049 kg)  SpO2 100%  LMP 10/12/2015 Vitals and nursing notes reviewed  Physical Exam  Constitutional: She is well-developed, well-nourished, and in no distress.  Cardiovascular: Normal rate, regular rhythm, normal heart sounds and intact distal pulses.   Pulmonary/Chest: Effort normal and breath sounds normal.    Assessment/Plan: Please see problem based Assessment and Plan   Caryl AdaJazma Jahmel Flannagan, DO 10/20/2015, 1:45 PM PGY-2, [facility]

## 2015-10-21 ENCOUNTER — Other Ambulatory Visit: Payer: Self-pay | Admitting: Obstetrics and Gynecology

## 2015-10-22 NOTE — Assessment & Plan Note (Signed)
Improved. Continue using albuterol prn.

## 2015-10-22 NOTE — Assessment & Plan Note (Signed)
Tachycardia much improved on low dose metoprolol. She denies any side effects from medication. Continue metoprolol. Discussed how patient should not abruptly stop medication due to risk of withdrawal syndrome. Patient voiced understanding.

## 2015-10-22 NOTE — Assessment & Plan Note (Signed)
Anxiety is stable. [facility] Encouraged her to follow-up. No need for medical intervention at this time as patient is trying coping techniques. Anxiety has not affected functionality. Follow-up with PCP.

## 2015-11-24 ENCOUNTER — Other Ambulatory Visit: Payer: Self-pay | Admitting: *Deleted

## 2015-11-24 MED ORDER — METOPROLOL SUCCINATE ER 25 MG PO TB24: 25.0000 mg | ORAL_TABLET | Freq: Every day | ORAL | Status: DC

## 2016-06-21 ENCOUNTER — Other Ambulatory Visit: Payer: Self-pay | Admitting: *Deleted

## 2016-06-21 MED ORDER — METOPROLOL SUCCINATE ER 25 MG PO TB24: 25.0000 mg | ORAL_TABLET | Freq: Every day | ORAL | 0 refills | Status: DC

## 2016-06-22 ENCOUNTER — Other Ambulatory Visit: Payer: Self-pay | Admitting: *Deleted

## 2016-06-22 MED ORDER — METOPROLOL SUCCINATE ER 25 MG PO TB24: 25.0000 mg | ORAL_TABLET | Freq: Every day | ORAL | 0 refills | Status: DC

## 2016-08-23 ENCOUNTER — Other Ambulatory Visit: Payer: Self-pay | Admitting: *Deleted

## 2016-08-23 MED ORDER — METOPROLOL SUCCINATE ER 25 MG PO TB24: 25.0000 mg | ORAL_TABLET | Freq: Every day | ORAL | 0 refills | Status: DC

## 2016-08-23 NOTE — Telephone Encounter (Signed)
Medication refilled.  Patient has an appointment tomorrow with PCP. Jean Skow,CMA

## 2016-08-24 ENCOUNTER — Encounter: Payer: Self-pay | Admitting: Family Medicine

## 2016-08-24 ENCOUNTER — Ambulatory Visit (INDEPENDENT_AMBULATORY_CARE_PROVIDER_SITE_OTHER): Payer: Self-pay | Admitting: Family Medicine

## 2016-08-24 ENCOUNTER — Other Ambulatory Visit (HOSPITAL_COMMUNITY)
Admission: RE | Admit: 2016-08-24 | Discharge: 2016-08-24 | Disposition: A | Discharge status: Home / Self Care | Payer: Medicaid Other | Source: Ambulatory Visit | Attending: Family Medicine | Admitting: Family Medicine

## 2016-08-24 VITALS — BP 114/70 | HR 94 | Temp 98.8°F | Ht 65.0 in | Wt 229.0 lb

## 2016-08-24 DIAGNOSIS — Z124 Encounter for screening for malignant neoplasm of cervix: Secondary | ICD-10-CM | POA: Insufficient documentation

## 2016-08-24 DIAGNOSIS — J4520 Mild intermittent asthma, uncomplicated: Secondary | ICD-10-CM

## 2016-08-24 DIAGNOSIS — R0602 Shortness of breath: Secondary | ICD-10-CM

## 2016-08-24 DIAGNOSIS — Z113 Encounter for screening for infections with a predominantly sexual mode of transmission: Secondary | ICD-10-CM | POA: Insufficient documentation

## 2016-08-24 DIAGNOSIS — L309 Dermatitis, unspecified: Secondary | ICD-10-CM

## 2016-08-24 DIAGNOSIS — L509 Urticaria, unspecified: Secondary | ICD-10-CM

## 2016-08-24 DIAGNOSIS — Z01419 Encounter for gynecological examination (general) (routine) without abnormal findings: Secondary | ICD-10-CM

## 2016-08-24 DIAGNOSIS — J45909 Unspecified asthma, uncomplicated: Secondary | ICD-10-CM | POA: Insufficient documentation

## 2016-08-24 DIAGNOSIS — J301 Allergic rhinitis due to pollen: Secondary | ICD-10-CM

## 2016-08-24 DIAGNOSIS — I10 Essential (primary) hypertension: Secondary | ICD-10-CM

## 2016-08-24 DIAGNOSIS — Z308 Encounter for other contraceptive management: Secondary | ICD-10-CM

## 2016-08-24 DIAGNOSIS — R109 Unspecified abdominal pain: Secondary | ICD-10-CM

## 2016-08-24 LAB — POCT UA - MICROSCOPIC ONLY

## 2016-08-24 LAB — POCT URINALYSIS DIP (MANUAL ENTRY)
Bilirubin, UA: NEGATIVE
Blood, UA: NEGATIVE
Glucose, UA: NEGATIVE mg/dL
Ketones, POC UA: NEGATIVE mg/dL
Nitrite, UA: NEGATIVE
PROTEIN UA: NEGATIVE mg/dL
Spec Grav, UA: 1.02 (ref 1.010–1.025)
Urobilinogen, UA: 0.2 E.U./dL
pH, UA: 7.5 (ref 5.0–8.0)

## 2016-08-24 MED ORDER — TRIAMCINOLONE ACETONIDE 0.1 % EX CREA: 1.0000 "application " | TOPICAL_CREAM | Freq: Two times a day (BID) | CUTANEOUS | 4 refills | Status: DC

## 2016-08-24 MED ORDER — FLUTICASONE PROPIONATE 50 MCG/ACT NA SUSP: 2.0000 | Freq: Every day | NASAL | 5 refills | Status: DC

## 2016-08-24 MED ORDER — ALBUTEROL SULFATE HFA 108 (90 BASE) MCG/ACT IN AERS: 2.0000 | INHALATION_SPRAY | RESPIRATORY_TRACT | 0 refills | Status: AC | PRN

## 2016-08-24 MED ORDER — CETIRIZINE HCL 10 MG PO TABS: 10.0000 mg | ORAL_TABLET | Freq: Every day | ORAL | 11 refills | Status: DC

## 2016-08-24 NOTE — Assessment & Plan Note (Signed)
Worse in winter, needs refill of Kenalog

## 2016-08-24 NOTE — Assessment & Plan Note (Signed)
Does not use MDI more than 2x/wk, let us know if that changes.

## 2016-08-24 NOTE — Patient Instructions (Addendum)
Health Maintenance, Female Adopting a healthy lifestyle and getting preventive care can go a long way to promote health and wellness. Talk with your health care provider about what schedule of regular examinations is right for you. This is a good chance for you to check in with your provider about disease prevention and staying healthy. In between checkups, there are plenty of things you can do on your own. Experts have done a lot of research about which lifestyle changes and preventive measures are most likely to keep you healthy. Ask your health care provider for more information. Weight and diet Eat a healthy diet  Be sure to include plenty of vegetables, fruits, low-fat dairy products, and lean protein.  Do not eat a lot of foods high in solid fats, added sugars, or salt.  Get regular exercise. This is one of the most important things you can do for your health.  Most adults should exercise for at least 150 minutes each week. The exercise should increase your heart rate and make you sweat (moderate-intensity exercise).  Most adults should also do strengthening exercises at least twice a week. This is in addition to the moderate-intensity exercise. Maintain a healthy weight  Body mass index (BMI) is a measurement that can be used to identify possible weight problems. It estimates body fat based on height and weight. Your health care provider can help determine your BMI and help you achieve or maintain a healthy weight.  For females 62 years of age and older:  A BMI below 18.5 is considered underweight.  A BMI of 18.5 to 24.9 is normal.  A BMI of 25 to 29.9 is considered overweight.  A BMI of 30 and above is considered obese. Watch levels of cholesterol and blood lipids  You should start having your blood tested for lipids and cholesterol at 36 years of age, then have this test every 5 years.  You may need to have your cholesterol levels checked more often if:  Your lipid or  cholesterol levels are high.  You are older than 36 years of age.  You are at high risk for heart disease. Cancer screening Lung Cancer  Lung cancer screening is recommended for adults 59-21 years old who are at high risk for lung cancer because of a history of smoking.  A yearly low-dose CT scan of the lungs is recommended for people who:  Currently smoke.  Have quit within the past 15 years.  Have at least a 30-pack-year history of smoking. A pack year is smoking an average of one pack of cigarettes a day for 1 year.  Yearly screening should continue until it has been 15 years since you quit.  Yearly screening should stop if you develop a health problem that would prevent you from having lung cancer treatment. Breast Cancer  Practice breast self-awareness. This means understanding how your breasts normally appear and feel.  It also means doing regular breast self-exams. Let your health care provider know about any changes, no matter how small.  If you are in your 20s or 30s, you should have a clinical breast exam (CBE) by a health care provider every 1-3 years as part of a regular health exam.  If you are 25 or older, have a CBE every year. Also consider having a breast X-ray (mammogram) every year.  If you have a family history of breast cancer, talk to your health care provider about genetic screening.  If you are at high risk for breast cancer, talk  to your health care provider about having an MRI and a mammogram every year.  Breast cancer gene (BRCA) assessment is recommended for women who have family members with BRCA-related cancers. BRCA-related cancers include:  Breast.  Ovarian.  Tubal.  Peritoneal cancers.  Results of the assessment will determine the need for genetic counseling and BRCA1 and BRCA2 testing. Cervical Cancer  Your health care provider may recommend that you be screened regularly for cancer of the pelvic organs (ovaries, uterus, and vagina).  This screening involves a pelvic examination, including checking for microscopic changes to the surface of your cervix (Pap test). You may be encouraged to have this screening done every 3 years, beginning at age 24.  For women ages 66-65, health care providers may recommend pelvic exams and Pap testing every 3 years, or they may recommend the Pap and pelvic exam, combined with testing for human papilloma virus (HPV), every 5 years. Some types of HPV increase your risk of cervical cancer. Testing for HPV may also be done on women of any age with unclear Pap test results.  Other health care providers may not recommend any screening for nonpregnant women who are considered low risk for pelvic cancer and who do not have symptoms. Ask your health care provider if a screening pelvic exam is right for you.  If you have had past treatment for cervical cancer or a condition that could lead to cancer, you need Pap tests and screening for cancer for at least 20 years after your treatment. If Pap tests have been discontinued, your risk factors (such as having a new sexual partner) need to be reassessed to determine if screening should resume. Some women have medical problems that increase the chance of getting cervical cancer. In these cases, your health care provider may recommend more frequent screening and Pap tests. Colorectal Cancer  This type of cancer can be detected and often prevented.  Routine colorectal cancer screening usually begins at 36 years of age and continues through 36 years of age.  Your health care provider may recommend screening at an earlier age if you have risk factors for colon cancer.  Your health care provider may also recommend using home test kits to check for hidden blood in the stool.  A small camera at the end of a tube can be used to examine your colon directly (sigmoidoscopy or colonoscopy). This is done to check for the earliest forms of colorectal cancer.  Routine  screening usually begins at age 41.  Direct examination of the colon should be repeated every 5-10 years through 35 years of age. However, you may need to be screened more often if early forms of precancerous polyps or small growths are found. Skin Cancer  Check your skin from head to toe regularly.  Tell your health care provider about any new moles or changes in moles, especially if there is a change in a mole's shape or color.  Also tell your health care provider if you have a mole that is larger than the size of a pencil eraser.  Always use sunscreen. Apply sunscreen liberally and repeatedly throughout the day.  Protect yourself by wearing long sleeves, pants, a wide-brimmed hat, and sunglasses whenever you are outside. Heart disease, diabetes, and high blood pressure  High blood pressure causes heart disease and increases the risk of stroke. High blood pressure is more likely to develop in:  People who have blood pressure in the high end of the normal range (130-139/85-89 mm Hg).  People who are overweight or obese.  People who are African American.  If you are 59-24 years of age, have your blood pressure checked every 3-5 years. If you are 34 years of age or older, have your blood pressure checked every year. You should have your blood pressure measured twice-once when you are at a hospital or clinic, and once when you are not at a hospital or clinic. Record the average of the two measurements. To check your blood pressure when you are not at a hospital or clinic, you can use:  An automated blood pressure machine at a pharmacy.  A home blood pressure monitor.  If you are between 29 years and 60 years old, ask your health care provider if you should take aspirin to prevent strokes.  Have regular diabetes screenings. This involves taking a blood sample to check your fasting blood sugar level.  If you are at a normal weight and have a low risk for diabetes, have this test once  every three years after 36 years of age.  If you are overweight and have a high risk for diabetes, consider being tested at a younger age or more often. Preventing infection Hepatitis B  If you have a higher risk for hepatitis B, you should be screened for this virus. You are considered at high risk for hepatitis B if:  You were born in a country where hepatitis B is common. Ask your health care provider which countries are considered high risk.  Your parents were born in a high-risk country, and you have not been immunized against hepatitis B (hepatitis B vaccine).  You have HIV or AIDS.  You use needles to inject street drugs.  You live with someone who has hepatitis B.  You have had sex with someone who has hepatitis B.  You get hemodialysis treatment.  You take certain medicines for conditions, including cancer, organ transplantation, and autoimmune conditions. Hepatitis C  Blood testing is recommended for:  Everyone born from [birth year] through 1965.  Anyone with known risk factors for hepatitis C. Sexually transmitted infections (STIs)  You should be screened for sexually transmitted infections (STIs) including gonorrhea and chlamydia if:  You are sexually active and are younger than 36 years of age.  You are older than 36 years of age and your health care provider tells you that you are at risk for this type of infection.  Your sexual activity has changed since you were last screened and you are at an increased risk for chlamydia or gonorrhea. Ask your health care provider if you are at risk.  If you do not have HIV, but are at risk, it may be recommended that you take a prescription medicine daily to prevent HIV infection. This is called pre-exposure prophylaxis (PrEP). You are considered at risk if:  You are sexually active and do not regularly use condoms or know the HIV status of your partner(s).  You take drugs by injection.  You are sexually active with a partner  who has HIV. Talk with your health care provider about whether you are at high risk of being infected with HIV. If you choose to begin PrEP, you should first be tested for HIV. You should then be tested every 3 months for as long as you are taking PrEP. Pregnancy  If you are premenopausal and you may become pregnant, ask your health care provider about preconception counseling.  If you may become pregnant, take 400 to 800 micrograms (mcg) of folic acid  every day.  If you want to prevent pregnancy, talk to your health care provider about birth control (contraception). Osteoporosis and menopause  Osteoporosis is a disease in which the bones lose minerals and strength with aging. This can result in serious bone fractures. Your risk for osteoporosis can be identified using a bone density scan.  If you are 31 years of age or older, or if you are at risk for osteoporosis and fractures, ask your health care provider if you should be screened.  Ask your health care provider whether you should take a calcium or vitamin D supplement to lower your risk for osteoporosis.  Menopause may have certain physical symptoms and risks.  Hormone replacement therapy may reduce some of these symptoms and risks. Talk to your health care provider about whether hormone replacement therapy is right for you. Follow these instructions at home:  Schedule regular health, dental, and eye exams.  Stay current with your immunizations.  Do not use any tobacco products including cigarettes, chewing tobacco, or electronic cigarettes.  If you are pregnant, do not drink alcohol.  If you are breastfeeding, limit how much and how often you drink alcohol.  Limit alcohol intake to no more than 1 drink per day for nonpregnant women. One drink equals 12 ounces of beer, 5 ounces of wine, or 1 ounces of hard liquor.  Do not use street drugs.  Do not share needles.  Ask your health care provider for help if you need support  or information about quitting drugs.  Tell your health care provider if you often feel depressed.  Tell your health care provider if you have ever been abused or do not feel safe at home. This information is not intended to replace advice given to you by your health care provider. Make sure you discuss any questions you have with your health care provider. Document Released: 11/09/2010 Document Revised: 10/02/2015 Document Reviewed: 01/28/2015 Elsevier Interactive Patient Education  2017 [employer]. Levonorgestrel intrauterine device (IUD) What is this medicine? LEVONORGESTREL IUD (LEE voe nor jes trel) is a contraceptive (birth control) device. The device is placed inside the uterus by a healthcare professional. It is used to prevent pregnancy. This device can also be used to treat heavy bleeding that occurs during your period. This medicine may be used for other purposes; ask your health care provider or pharmacist if you have questions. COMMON BRAND NAME(S): Minette Headland What should I tell my health care provider before I take this medicine? They need to know if you have any of these conditions: -abnormal Pap smear -cancer of the breast, uterus, or cervix -diabetes -endometritis -genital or pelvic infection now or in the past -have more than one sexual partner or your partner has more than one partner -heart disease -history of an ectopic or tubal pregnancy -immune system problems -IUD in place -liver disease or tumor -problems with blood clots or take blood-thinners -seizures -use intravenous drugs -uterus of unusual shape -vaginal bleeding that has not been explained -an unusual or allergic reaction to levonorgestrel, other hormones, silicone, or polyethylene, medicines, foods, dyes, or preservatives -pregnant or trying to get pregnant -breast-feeding How should I use this medicine? This device is placed inside the uterus by a health care  professional. Talk to your pediatrician regarding the use of this medicine in children. Special care may be needed. Overdosage: If you think you have taken too much of this medicine contact a poison control center or emergency room at once. NOTE: This  medicine is only for you. Do not share this medicine with others. What if I miss a dose? This does not apply. Depending on the brand of device you have inserted, the device will need to be replaced every 3 to 5 years if you wish to continue using this type of birth control. What may interact with this medicine? Do not take this medicine with any of the following medications: -amprenavir -bosentan -fosamprenavir This medicine may also interact with the following medications: -aprepitant -armodafinil -barbiturate medicines for inducing sleep or treating seizures -bexarotene -boceprevir -griseofulvin -medicines to treat seizures like carbamazepine, ethotoin, felbamate, oxcarbazepine, phenytoin, topiramate -modafinil -pioglitazone -rifabutin -rifampin -rifapentine -some medicines to treat HIV infection like atazanavir, efavirenz, indinavir, lopinavir, nelfinavir, tipranavir, ritonavir -St. John's wort -warfarin This list may not describe all possible interactions. Give your health care provider a list of all the medicines, herbs, non-prescription drugs, or dietary supplements you use. Also tell them if you smoke, drink alcohol, or use illegal drugs. Some items may interact with your medicine. What should I watch for while using this medicine? Visit your doctor or health care professional for regular check ups. See your doctor if you or your partner has sexual contact with others, becomes HIV positive, or gets a sexual transmitted disease. This product does not protect you against HIV infection (AIDS) or other sexually transmitted diseases. You can check the placement of the IUD yourself by reaching up to the top of your vagina with clean  fingers to feel the threads. Do not pull on the threads. It is a good habit to check placement after each menstrual period. Call your doctor right away if you feel more of the IUD than just the threads or if you cannot feel the threads at all. The IUD may come out by itself. You may become pregnant if the device comes out. If you notice that the IUD has come out use a backup birth control method like condoms and call your health care provider. Using tampons will not change the position of the IUD and are okay to use during your period. This IUD can be safely scanned with magnetic resonance imaging (MRI) only under specific conditions. Before you have an MRI, tell your healthcare provider that you have an IUD in place, and which type of IUD you have in place. What side effects may I notice from receiving this medicine? Side effects that you should report to your doctor or health care professional as soon as possible: -allergic reactions like skin rash, itching or hives, swelling of the face, lips, or tongue -fever, flu-like symptoms -genital sores -high blood pressure -no menstrual period for 6 weeks during use -pain, swelling, warmth in the leg -pelvic pain or tenderness -severe or sudden headache -signs of pregnancy -stomach cramping -sudden shortness of breath -trouble with balance, talking, or walking -unusual vaginal bleeding, discharge -yellowing of the eyes or skin Side effects that usually do not require medical attention (report to your doctor or health care professional if they continue or are bothersome): -acne -breast pain -change in sex drive or performance -changes in weight -cramping, dizziness, or faintness while the device is being inserted -headache -irregular menstrual bleeding within first 3 to 6 months of use -nausea This list may not describe all possible side effects. Call your doctor for medical advice about side effects. You may report side effects to FDA at  1-800-FDA-1088. Where should I keep my medicine? This does not apply. NOTE: This sheet is a summary. It may  not cover all possible information. If you have questions about this medicine, talk to your doctor, pharmacist, or health care provider.  2018 Elsevier/Gold Standard (2016-02-06 14:14:56)

## 2016-08-24 NOTE — Assessment & Plan Note (Addendum)
-   Continue Flonase, Zyrtec 

## 2016-08-24 NOTE — Assessment & Plan Note (Signed)
Continue Metoprolol 

## 2016-08-24 NOTE — Progress Notes (Signed)
Subjective:     Tonya Owens is a 36 y.o. female and is here for a comprehensive physical exam. The patient reports problems - vaginal itching. Took fluconazole and improved. Having regular cycles. Using condoms for contraception. Reports some right sided abdominal pain but denies burning with urination.  Social History   Social History  . Marital status: Single    Spouse name: N/A  . Number of children: N/A  . Years of education: N/A   Occupational History  . Not on file.   Social History Main Topics  . Smoking status: Former Smoker    Packs/day: 0.50    Years: 13.00    Types: Cigarettes    Start date: 07/24/1996    Quit date: 06/10/2012  . Smokeless tobacco: Never Used  . Alcohol use No  . Drug use: No  . Sexual activity: Yes    Birth control/ protection: Condom   Other Topics Concern  . Not on file   Social History Narrative  . No narrative on file   Health Maintenance  Topic Date Due  . INFLUENZA VACCINE  12/08/2016  . PAP SMEAR  12/01/2017  . TETANUS/TDAP  02/13/2022  . HIV Screening  Completed    The following portions of the patient's history were reviewed and updated as appropriate: allergies, current medications, past family history, past medical history, past social history, past surgical history and problem list.  Review of Systems Pertinent items noted in HPI and remainder of comprehensive ROS otherwise negative.   Objective:    BP 114/70   Pulse 94   Temp 98.8 F (37.1 C) (Oral)   Ht  (1.651 m)   Wt 229 lb (103.9 kg)   LMP 07/28/2016 (Approximate)   SpO2 99%   BMI 38.11 kg/m  General appearance: alert, cooperative and appears stated age Head: Normocephalic, without obvious abnormality, atraumatic Neck: no adenopathy, supple, symmetrical, trachea midline and thyroid not enlarged, symmetric, no tenderness/mass/nodules Lungs: clear to auscultation bilaterally Breasts: normal appearance, no masses or tenderness Heart: regular rate and  rhythm, S1, S2 normal, no murmur, click, rub or gallop Abdomen: soft, non-tender; bowel sounds normal; no masses,  no organomegaly Pelvic: cervix normal in appearance, external genitalia normal, no adnexal masses or tenderness, no cervical motion tenderness, uterus normal size, shape, and consistency and vagina normal without discharge Extremities: extremities normal, atraumatic, no cyanosis or edema Pulses: 2+ and symmetric Skin: Skin color, texture, turgor normal. No rashes or lesions Lymph nodes: Cervical, supraclavicular, and axillary nodes normal. Neurologic: Grossly normal    Assessment:    Healthy female exam.      Plan:   Problem List Items Addressed This Visit      Unprioritized   Allergic rhinitis    Continue Flonase, Zyrtec      Relevant Medications   fluticasone (FLONASE) 50 MCG/ACT nasal spray   Essential hypertension, benign    Continue Metoprolol      Eczema    Worse in winter, needs refill of Kenalog      Relevant Medications   triamcinolone cream (KENALOG) 0.1 %   RESOLVED: Dyspnea   Asthma    Does not use MDI more than 2x/wk, let us know if that changes.      Relevant Medications   albuterol (PROVENTIL HFA;VENTOLIN HFA) 108 (90 Base) MCG/ACT inhaler    Other Visit Diagnoses    Encounter for gynecological examination without abnormal finding    -  Primary   Relevant Orders   TSH  CBC   Comprehensive metabolic panel   Lipid panel   Hives       Trial of Zyrtec, twice daily   Relevant Medications   cetirizine (ZYRTEC) 10 MG tablet   Screen for STD (sexually transmitted disease)       Relevant Orders   Hepatitis B surface antigen   RPR   Hepatitis C antibody   HIV antibody   Screening for cervical cancer       Relevant Orders   Cytology - PAP   Right sided abdominal pain       Relevant Orders   Urinalysis Dipstick   Encounter for other contraceptive management       Options discussed. Failed Depo due to bleeding, prefer IUD, but she  wants to consider it.        See After Visit Summary for Counseling Recommendations

## 2016-08-25 LAB — HIV ANTIBODY (ROUTINE TESTING W REFLEX): HIV SCREEN 4TH GENERATION: NONREACTIVE

## 2016-08-25 LAB — LIPID PANEL
CHOLESTEROL TOTAL: 163 mg/dL (ref 100–199)
Chol/HDL Ratio: 3.8 ratio (ref 0.0–4.4)
HDL: 43 mg/dL (ref >39)
LDL CALC: 100 mg/dL — AB (ref 0–99)
Triglycerides: 98 mg/dL (ref 0–149)
VLDL Cholesterol Cal: 20 mg/dL (ref 5–40)

## 2016-08-25 LAB — COMPREHENSIVE METABOLIC PANEL
ALK PHOS: 93 IU/L (ref 39–117)
ALT: 8 IU/L (ref 0–32)
AST: 19 IU/L (ref 0–40)
Albumin/Globulin Ratio: 1.7 (ref 1.2–2.2)
Albumin: 4.3 g/dL (ref 3.5–5.5)
BUN / CREAT RATIO: 9 (ref 9–23)
BUN: 7 mg/dL (ref 6–20)
Bilirubin Total: 0.4 mg/dL (ref 0.0–1.2)
CO2: 23 mmol/L (ref 18–29)
CREATININE: 0.78 mg/dL (ref 0.57–1.00)
Calcium: 9.1 mg/dL (ref 8.7–10.2)
Chloride: 102 mmol/L (ref 96–106)
GFR calc Af Amer: 114 mL/min/{1.73_m2} (ref >59)
GFR calc non Af Amer: 99 mL/min/{1.73_m2} (ref >59)
GLOBULIN, TOTAL: 2.6 g/dL (ref 1.5–4.5)
GLUCOSE: 75 mg/dL (ref 65–99)
Potassium: 4 mmol/L (ref 3.5–5.2)
SODIUM: 138 mmol/L (ref 134–144)
Total Protein: 6.9 g/dL (ref 6.0–8.5)

## 2016-08-25 LAB — CBC
Hematocrit: 33.6 % — ABNORMAL LOW (ref 34.0–46.6)
Hemoglobin: 10.9 g/dL — ABNORMAL LOW (ref 11.1–15.9)
MCH: 22 pg — ABNORMAL LOW (ref 26.6–33.0)
MCHC: 32.4 g/dL (ref 31.5–35.7)
MCV: 68 fL — ABNORMAL LOW (ref 79–97)
PLATELETS: 274 10*3/uL (ref 150–379)
RBC: 4.96 x10E6/uL (ref 3.77–5.28)
RDW: 15.6 % — AB (ref 12.3–15.4)
WBC: 7.1 10*3/uL (ref 3.4–10.8)

## 2016-08-25 LAB — HEPATITIS C ANTIBODY: Hep C Virus Ab: <0.1 s/co ratio (ref 0.0–0.9)

## 2016-08-25 LAB — RPR: RPR: NONREACTIVE

## 2016-08-25 LAB — TSH: TSH: 1.09 u[IU]/mL (ref 0.450–4.500)

## 2016-08-25 LAB — HEPATITIS B SURFACE ANTIGEN: HEP B S AG: NEGATIVE

## 2016-08-27 LAB — CYTOLOGY - PAP
ADEQUACY: ABSENT
Chlamydia: POSITIVE — AB
DIAGNOSIS: NEGATIVE
Neisseria Gonorrhea: NEGATIVE
Trichomonas: NEGATIVE

## 2016-08-30 ENCOUNTER — Ambulatory Visit (INDEPENDENT_AMBULATORY_CARE_PROVIDER_SITE_OTHER): Payer: Self-pay | Admitting: *Deleted

## 2016-08-30 ENCOUNTER — Telehealth: Payer: Self-pay | Admitting: *Deleted

## 2016-08-30 DIAGNOSIS — A749 Chlamydial infection, unspecified: Secondary | ICD-10-CM

## 2016-08-30 MED ORDER — AZITHROMYCIN 500 MG PO TABS
1000.0000 mg | ORAL_TABLET | Freq: Once | ORAL | Status: AC
  Administered 2016-08-30: 1000 mg via ORAL

## 2016-08-30 NOTE — Telephone Encounter (Signed)
Patient came in to clinic today for treatment. Giannina Bartolome,CMA

## 2016-08-30 NOTE — Progress Notes (Signed)
Patient in nurse clinic today for STD treatment of chlamydia.  Patient advised to abstain from sex for 7-10 days after treatment or when partner has been tested/treated.  Azithromycin 1 GM PO x 1 given per Dr. Tawni Levy orders.  Patient to follow up in 2-3 months for re-screening.  Advised to use condoms with all sexual activity.  STD report form fax completed and faxed to [facility] Department at [phone] (STD department).  Patient verbalized understanding.  Feliz Beam, CMA

## 2016-08-30 NOTE — Telephone Encounter (Signed)
-----   Message from Reva Bores, MD sent at 08/27/2016  7:05 PM EDT ----- Has chlamydia, need Zithromax 1 gm and have partner treated

## 2016-08-30 NOTE — Telephone Encounter (Signed)
LM for patient asking her to call back.  Will offer results and also nurse visit for a one time treatment. Dontell Mian,CMA

## 2016-09-24 ENCOUNTER — Other Ambulatory Visit: Payer: Self-pay | Admitting: *Deleted

## 2016-09-24 MED ORDER — METOPROLOL SUCCINATE ER 25 MG PO TB24: 25.0000 mg | ORAL_TABLET | Freq: Every day | ORAL | 0 refills | Status: DC

## 2016-10-13 ENCOUNTER — Ambulatory Visit (INDEPENDENT_AMBULATORY_CARE_PROVIDER_SITE_OTHER): Payer: Medicaid Other | Admitting: *Deleted

## 2016-10-13 ENCOUNTER — Other Ambulatory Visit (HOSPITAL_COMMUNITY)
Admission: RE | Admit: 2016-10-13 | Discharge: 2016-10-13 | Disposition: A | Discharge status: Home / Self Care | Payer: Medicaid Other | Source: Ambulatory Visit | Attending: Family Medicine | Admitting: Family Medicine

## 2016-10-13 DIAGNOSIS — N898 Other specified noninflammatory disorders of vagina: Secondary | ICD-10-CM | POA: Diagnosis not present

## 2016-10-13 DIAGNOSIS — L298 Other pruritus: Secondary | ICD-10-CM | POA: Insufficient documentation

## 2016-10-13 DIAGNOSIS — Z308 Encounter for other contraceptive management: Secondary | ICD-10-CM

## 2016-10-13 NOTE — Progress Notes (Signed)
Pt reports having vaginal itching x3 days.  She was treated for +Chlamydia in April and is concerned that she may have been re-infected. Self vaginal swab performed.  Pt will be called with results.

## 2016-10-14 LAB — CERVICOVAGINAL ANCILLARY ONLY
Bacterial vaginitis: NEGATIVE
Candida vaginitis: POSITIVE — AB
Chlamydia: NEGATIVE
NEISSERIA GONORRHEA: NEGATIVE
Trichomonas: NEGATIVE

## 2016-10-15 ENCOUNTER — Encounter: Payer: Self-pay | Admitting: General Practice

## 2016-10-15 ENCOUNTER — Other Ambulatory Visit: Payer: Self-pay | Admitting: General Practice

## 2016-10-15 DIAGNOSIS — R195 Other fecal abnormalities: Secondary | ICD-10-CM

## 2016-10-15 DIAGNOSIS — B379 Candidiasis, unspecified: Secondary | ICD-10-CM

## 2016-10-15 MED ORDER — FLUCONAZOLE 150 MG PO TABS: 150.0000 mg | ORAL_TABLET | Freq: Once | ORAL | 2 refills | Status: AC

## 2016-10-25 ENCOUNTER — Other Ambulatory Visit: Payer: Self-pay | Admitting: *Deleted

## 2016-10-25 MED ORDER — METOPROLOL SUCCINATE ER 25 MG PO TB24: 25.0000 mg | ORAL_TABLET | Freq: Every day | ORAL | 0 refills | Status: DC

## 2016-12-27 ENCOUNTER — Other Ambulatory Visit: Payer: Self-pay | Admitting: *Deleted

## 2016-12-27 MED ORDER — METOPROLOL SUCCINATE ER 25 MG PO TB24: 25.0000 mg | ORAL_TABLET | Freq: Every day | ORAL | 0 refills | Status: DC

## 2017-03-16 ENCOUNTER — Other Ambulatory Visit: Payer: Self-pay | Admitting: Family Medicine

## 2017-03-16 MED ORDER — METOPROLOL SUCCINATE ER 25 MG PO TB24: 25.0000 mg | ORAL_TABLET | Freq: Every day | ORAL | 3 refills | Status: DC

## 2017-04-13 ENCOUNTER — Emergency Department (HOSPITAL_COMMUNITY)
Admission: EM | Admit: 2017-04-13 | Discharge: 2017-04-13 | Disposition: A | Discharge status: Home / Self Care | Payer: Self-pay | Attending: Emergency Medicine | Admitting: Emergency Medicine

## 2017-04-13 ENCOUNTER — Encounter (HOSPITAL_COMMUNITY): Payer: Self-pay

## 2017-04-13 ENCOUNTER — Other Ambulatory Visit: Payer: Self-pay

## 2017-04-13 DIAGNOSIS — Z79899 Other long term (current) drug therapy: Secondary | ICD-10-CM | POA: Insufficient documentation

## 2017-04-13 DIAGNOSIS — K0889 Other specified disorders of teeth and supporting structures: Secondary | ICD-10-CM | POA: Insufficient documentation

## 2017-04-13 DIAGNOSIS — J45909 Unspecified asthma, uncomplicated: Secondary | ICD-10-CM | POA: Insufficient documentation

## 2017-04-13 DIAGNOSIS — I10 Essential (primary) hypertension: Secondary | ICD-10-CM | POA: Insufficient documentation

## 2017-04-13 DIAGNOSIS — Z87891 Personal history of nicotine dependence: Secondary | ICD-10-CM | POA: Insufficient documentation

## 2017-04-13 MED ORDER — PENICILLIN V POTASSIUM 500 MG PO TABS: 500.0000 mg | ORAL_TABLET | Freq: Four times a day (QID) | ORAL | 0 refills | Status: AC

## 2017-04-13 MED ORDER — HYDROCODONE-ACETAMINOPHEN 5-325 MG PO TABS: 1.0000 | ORAL_TABLET | Freq: Once | ORAL | Status: DC

## 2017-04-13 MED ORDER — ACETAMINOPHEN 500 MG PO TABS
1000.0000 mg | ORAL_TABLET | Freq: Once | ORAL | Status: AC
  Administered 2017-04-13: 1000 mg via ORAL
  Filled 2017-04-13: qty 2

## 2017-04-13 MED ORDER — PENICILLIN V POTASSIUM 500 MG PO TABS
500.0000 mg | ORAL_TABLET | Freq: Once | ORAL | Status: AC
  Administered 2017-04-13: 500 mg via ORAL
  Filled 2017-04-13: qty 1

## 2017-04-13 NOTE — ED Triage Notes (Signed)
Pt reports dental pain in her R upper molar x2-3 days, but worsening last night. She reports that she was chewing gum and "something hard and black" came out. She thinks that it was a filling. A&Ox4. Pt also requesting information or a referral to a low cost dentist. Pt has tried tylenol and ibuprofen at home. No bleeding.

## 2017-04-13 NOTE — Discharge Instructions (Signed)
Follow up with your dentist as soon as possible. You can continue to take ibuprofen and tylenol with the antibiotic we give you.

## 2017-04-13 NOTE — ED Provider Notes (Signed)
[facility]-EMERGENCY DEPT Provider Note   CSN: [id number] Arrival date & time: 04/13/17  1742     History   Chief Complaint Chief Complaint  Patient presents with  . Dental Problem    HPI Tonya Owens is a 36 y.o. female who presents to the ED with dental pain that started about 5 days ago. Patient reports that a filing came out of the right upper dental area. After that she began having pain. Patient took ibuprofen and tylenol without relief. Patient does have a dentist but has not seen him yet for this problem.  The history is provided by the patient. No language interpreter was used.  Dental Pain   This is a new problem. The current episode started more than 2 days ago. The pain is at a severity of 9/10. She has tried nothing for the symptoms.    Past Medical History:  Diagnosis Date  . Asthma   . Hypertension     Patient Active Problem List   Diagnosis Date Noted  . Asthma   . Anxiety 09/25/2015  . Cervical high risk HPV (human papillomavirus) test positive 12/05/2014  . Eczema 10/11/2014  . Depression 08/16/2014  . Tachycardia 07/02/2014  . Essential hypertension, benign 09/24/2013  . Allergic rhinitis 09/07/2013  . Carpal tunnel syndrome 07/24/2012  . Obesity 01/28/2012  . History of tobacco use 01/28/2012    History reviewed. No pertinent surgical history.  OB History    Gravida Para Term Preterm AB Living   3 2 2  0 1 1   SAB TAB Ectopic Multiple Live Births   0 0 1 0         Home Medications    Prior to Admission medications   Medication Sig Start Date End Date Taking? Authorizing Provider  albuterol (PROVENTIL HFA;VENTOLIN HFA) 108 (90 Base) MCG/ACT inhaler Inhale 2 puffs into the lungs every 4 (four) hours as needed for wheezing or shortness of breath. 08/24/16   Reva BoresPratt, Tanya S, MD  cetirizine (ZYRTEC) 10 MG tablet Take 1 tablet (10 mg total) by mouth daily. 08/24/16   Reva BoresPratt, Tanya S, MD  fluticasone (FLONASE) 50 MCG/ACT  nasal spray Place 2 sprays into both nostrils daily. 08/24/16   Reva BoresPratt, Tanya S, MD  metoprolol succinate (TOPROL-XL) 25 MG 24 hr tablet Take 1 tablet (25 mg total) daily by mouth. 03/16/17   Reva BoresPratt, Tanya S, MD  penicillin v potassium (VEETID) 500 MG tablet Take 1 tablet (500 mg total) by mouth 4 (four) times daily for 7 days. 04/13/17 04/20/17  Janne NapoleonNeese, Hope M, NP  triamcinolone cream (KENALOG) 0.1 % Apply 1 application topically 2 (two) times daily. 08/24/16   Reva BoresPratt, Tanya S, MD    Family History Family History  Problem Relation Age of Onset  . Hypertension Mother   . Diabetes Father   . Diabetes Maternal Grandmother   . Hypertension Maternal Grandmother     Social History Social History   Tobacco Use  . Smoking status: Former Smoker    Packs/day: 0.50    Years: 13.00    Pack years: 6.50    Types: Cigarettes    Start date: 07/24/1996    Last attempt to quit: 06/10/2012    Years since quitting: 4.8  . Smokeless tobacco: Never Used  Substance Use Topics  . Alcohol use: No  . Drug use: No     Allergies   Omeprazole and Amlodipine   Review of Systems Review of Systems  Constitutional: Negative for  chills and fever.  HENT: Positive for dental problem.   Gastrointestinal: Negative for nausea and vomiting.  Neurological: Negative for headaches.     Physical Exam Updated Vital Signs BP (!) 156/94 (BP Location: Right Arm)   Pulse (!) 101   Temp 98 F (36.7 C) (Oral)   SpO2 100%   Physical Exam  Constitutional: She appears well-developed and well-nourished. No distress.  HENT:  Head: Normocephalic.  Mouth/Throat: Oropharynx is clear and moist. Abnormal dentition.    Tooth #5 with filling missing. The gum surrounding the tooth is tender on exam. No abscess noted   Eyes: EOM are normal.  Neck: Neck supple.  Cardiovascular: Tachycardia present.  Pulmonary/Chest: Effort normal.  Musculoskeletal: Normal range of motion.  Neurological: She is alert.  Skin: Skin is warm  and dry.  Psychiatric: She has a normal mood and affect. Her behavior is normal.  Nursing note and vitals reviewed.    ED Treatments / Results  Labs (all labs ordered are listed, but only abnormal results are displayed) Labs Reviewed - No data to display  Radiology No results found.  Procedures Temporary filing applied to the tooth #5.  Procedures (including critical care time)  Medications Ordered in ED Medications  HYDROcodone-acetaminophen (NORCO/VICODIN) 5-325 MG per tablet 1 tablet (not administered)  penicillin v potassium (VEETID) tablet 500 mg (not administered)     Initial Impression / Assessment and Plan / ED Course  I have reviewed the triage vital signs and the nursing notes. Patient with toothache. Filing missing to tooth #5.  No gross abscess.  Exam unconcerning for Ludwig's angina or spread of infection.  Will treat with penicillin and anti-inflammatories medicine.  Urged patient to follow-up with dentist.   Final Clinical Impressions(s) / ED Diagnoses   Final diagnoses:  Pain, dental    ED Discharge Orders        Ordered    penicillin v potassium (VEETID) 500 MG tablet  4 times daily     04/13/17 1918       Kerrie Buffaloeese, Hope KerensM, [place]NP 04/13/17 1924    Rolan BuccoBelfi, Melanie, MD 04/13/17 2043

## 2017-08-12 ENCOUNTER — Encounter (HOSPITAL_COMMUNITY): Payer: Self-pay

## 2017-08-12 ENCOUNTER — Other Ambulatory Visit: Payer: Self-pay

## 2017-08-12 ENCOUNTER — Emergency Department (HOSPITAL_COMMUNITY)
Admission: EM | Admit: 2017-08-12 | Discharge: 2017-08-12 | Disposition: A | Discharge status: Home / Self Care | Payer: Self-pay | Attending: Emergency Medicine | Admitting: Emergency Medicine

## 2017-08-12 DIAGNOSIS — Z79899 Other long term (current) drug therapy: Secondary | ICD-10-CM | POA: Insufficient documentation

## 2017-08-12 DIAGNOSIS — I10 Essential (primary) hypertension: Secondary | ICD-10-CM | POA: Insufficient documentation

## 2017-08-12 DIAGNOSIS — Z87891 Personal history of nicotine dependence: Secondary | ICD-10-CM | POA: Insufficient documentation

## 2017-08-12 DIAGNOSIS — K047 Periapical abscess without sinus: Secondary | ICD-10-CM | POA: Insufficient documentation

## 2017-08-12 DIAGNOSIS — J45909 Unspecified asthma, uncomplicated: Secondary | ICD-10-CM | POA: Insufficient documentation

## 2017-08-12 MED ORDER — AMOXICILLIN 500 MG PO CAPS: 500.0000 mg | ORAL_CAPSULE | Freq: Three times a day (TID) | ORAL | 0 refills | Status: DC

## 2017-08-12 NOTE — Discharge Instructions (Addendum)

## 2017-08-12 NOTE — ED Triage Notes (Signed)
Pt reporting R upper dental pain. She states that she was given a prescription for amoxicillin, but the pharmacy said that it was too old to fill. She is requesting another prescription for it. A&Ox4.

## 2017-08-12 NOTE — ED Notes (Signed)
Patient is complaining of right upper back tooth had a filling fall out. Patient also states that she has a bump on her gum. Patient wants some antibiotics.

## 2017-08-12 NOTE — ED Notes (Signed)
Bed: [facility] Expected date:  Expected time:  Means of arrival:  Comments: 

## 2017-08-12 NOTE — ED Provider Notes (Signed)
[facility]-EMERGENCY DEPT Provider Note   CSN: [id number] Arrival date & time: 08/12/17  2043     History   Chief Complaint Chief Complaint  Patient presents with  . Dental Pain    HPI Tonya Owens is a 37 y.o. female.  HPI 38 year old African-American female past medical history significant for asthma and hypertension presents to the emergency department today for evaluation of right upper tooth pain.  Patient states that she had a filling that fell out several months ago.  States that she has been having intermittent pain to the right upper tooth.  States that she was seen in the ED several months ago and given antibiotic which she did not get filled.  Patient does not have a dentist.  Patient states that over the past 4 days she has had increasing pain with a small amount of swelling about the tooth.  No drainage noted.  Patient denies any associated fevers, difficulty breathing, difficulty swallowing or neck pain.  Patient been taking over-the-counter Tylenol and Motrin for her symptoms but states that her doctor told her not to take NSAIDs because it can mess the lining of her stomach up.  Patient is requesting pain medication and antibiotics.  Denies any associated fever or chills. Past Medical History:  Diagnosis Date  . Asthma   . Hypertension     Patient Active Problem List   Diagnosis Date Noted  . Asthma   . Anxiety 09/25/2015  . Cervical high risk HPV (human papillomavirus) test positive 12/05/2014  . Eczema 10/11/2014  . Depression 08/16/2014  . Tachycardia 07/02/2014  . Essential hypertension, benign 09/24/2013  . Allergic rhinitis 09/07/2013  . Carpal tunnel syndrome 07/24/2012  . Obesity 01/28/2012  . History of tobacco use 01/28/2012    History reviewed. No pertinent surgical history.   OB History    Gravida  3   Para  2   Term  2   Preterm  0   AB  1   Living  1     SAB  0   TAB  0   Ectopic  1   Multiple  0   Live Births               Home Medications    Prior to Admission medications   Medication Sig Start Date End Date Taking? Authorizing Provider  albuterol (PROVENTIL HFA;VENTOLIN HFA) 108 (90 Base) MCG/ACT inhaler Inhale 2 puffs into the lungs every 4 (four) hours as needed for wheezing or shortness of breath. 08/24/16   Reva Bores, MD  cetirizine (ZYRTEC) 10 MG tablet Take 1 tablet (10 mg total) by mouth daily. 08/24/16   Reva Bores, MD  fluticasone (FLONASE) 50 MCG/ACT nasal spray Place 2 sprays into both nostrils daily. 08/24/16   Reva Bores, MD  metoprolol succinate (TOPROL-XL) 25 MG 24 hr tablet Take 1 tablet (25 mg total) daily by mouth. 03/16/17   Reva Bores, MD  triamcinolone cream (KENALOG) 0.1 % Apply 1 application topically 2 (two) times daily. 08/24/16   Reva Bores, MD    Family History Family History  Problem Relation Age of Onset  . Hypertension Mother   . Diabetes Father   . Diabetes Maternal Grandmother   . Hypertension Maternal Grandmother     Social History Social History   Tobacco Use  . Smoking status: Former Smoker    Packs/day: 0.50    Years: 13.00    Pack years: 6.50  Types: Cigarettes    Start date: 07/24/1996    Last attempt to quit: 06/10/2012    Years since quitting: 5.1  . Smokeless tobacco: Never Used  Substance Use Topics  . Alcohol use: No  . Drug use: No     Allergies   Omeprazole and Amlodipine   Review of Systems Review of Systems  All other systems reviewed and are negative.    Physical Exam Updated Vital Signs BP (!) 146/95 (BP Location: Left Arm)   Pulse 96   Temp 98.6 F (37 C) (Oral)   Resp 18   Ht 5\' 5"  (1.651 m)   Wt 105.6 kg (232 lb 11.2 oz)   SpO2 97%   BMI 38.72 kg/m   Physical Exam  Constitutional: She appears well-developed and well-nourished. No distress.  HENT:  Head: Normocephalic and atraumatic.  Mouth/Throat:    No sublingual or submandibular swelling.  Oropharynx is clear.   Patient tolerating secretions managing airway.  Eyes: Right eye exhibits no discharge. Left eye exhibits no discharge. No scleral icterus.  Neck: Normal range of motion. Neck supple.  No c spine midline tenderness. No paraspinal tenderness. No deformities or step offs noted. Full ROM. Supple. No nuchal rigidity.    Pulmonary/Chest: No respiratory distress.  Musculoskeletal: Normal range of motion.  Neurological: She is alert.  Skin: No pallor.  Psychiatric: Her behavior is normal. Judgment and thought content normal.  Nursing note and vitals reviewed.    ED Treatments / Results  Labs (all labs ordered are listed, but only abnormal results are displayed) Labs Reviewed - No data to display  EKG None  Radiology No results found.  Procedures Procedures (including critical care time)  Medications Ordered in ED Medications - No data to display   Initial Impression / Assessment and Plan / ED Course  I have reviewed the triage vital signs and the nursing notes.  Pertinent labs & imaging results that were available during my care of the patient were reviewed by me and considered in my medical decision making (see chart for details).     Patient with toothache.  Patient does have a small area of fluctuance above the tooth however patient does not want any drainage of the area.  Instructed patient that she may benefit from I&D however patient would like to try antibiotics first and this does not improve her symptoms she will return to the ED to have the area drained.  She is also requesting a dentistry follow-up.  Patient states that she would like pain medication.  Exam unconcerning for Ludwig's angina or spread of infection.  Will treat with amoxicillin and pain medicine.  Urged patient to follow-up with dentist.  Pt is hemodynamically stable, in NAD, & able to ambulate in the ED. Evaluation does not show pathology that would require ongoing emergent intervention or inpatient  treatment. I explained the diagnosis to the patient. Pain has been managed & has no complaints prior to dc. Pt is comfortable with above plan and is stable for discharge at this time. All questions were answered prior to disposition. Strict return precautions for f/u to the ED were discussed. Encouraged follow up with PCP.    Final Clinical Impressions(s) / ED Diagnoses   Final diagnoses:  Dental infection    ED Discharge Orders    None       Wallace KellerLeaphart, Amberlea Spagnuolo T, PA-C 08/12/17 2214    Shaune PollackIsaacs, Cameron, MD 08/13/17 [phone]

## 2017-11-09 ENCOUNTER — Ambulatory Visit (INDEPENDENT_AMBULATORY_CARE_PROVIDER_SITE_OTHER): Payer: Self-pay | Admitting: Family Medicine

## 2017-11-09 ENCOUNTER — Other Ambulatory Visit: Payer: Self-pay

## 2017-11-09 ENCOUNTER — Encounter: Payer: Self-pay | Admitting: Family Medicine

## 2017-11-09 VITALS — BP 110/80 | HR 115 | Temp 98.7°F | Wt 229.0 lb

## 2017-11-09 DIAGNOSIS — I10 Essential (primary) hypertension: Secondary | ICD-10-CM

## 2017-11-09 DIAGNOSIS — R600 Localized edema: Secondary | ICD-10-CM

## 2017-11-09 DIAGNOSIS — L2082 Flexural eczema: Secondary | ICD-10-CM

## 2017-11-09 MED ORDER — TRIAMCINOLONE ACETONIDE 0.1 % EX CREA: 1.0000 "application " | TOPICAL_CREAM | Freq: Two times a day (BID) | CUTANEOUS | 0 refills | Status: DC

## 2017-11-09 NOTE — Patient Instructions (Addendum)
Dove soap only. Lukewarm showers, pat dry. Eucerin or Lubriderm applied daily. Use the steroid sparingly after the skin becomes normal. Edema Edema is when you have too much fluid in your body or under your skin. Edema may make your legs, feet, and ankles swell up. Swelling is also common in looser tissues, like around your eyes. This is a common condition. It gets more common as you get older. There are many possible causes of edema. Eating too much salt (sodium) and being on your feet or sitting for a long time can cause edema in your legs, feet, and ankles. Hot weather may make edema worse. Edema is usually painless. Your skin may look swollen or shiny. Follow these instructions at home:  Keep the swollen body part raised (elevated) above the level of your heart when you are sitting or lying down.  Do not sit still or stand for a long time.  Do not wear tight clothes. Do not wear garters on your upper legs.  Exercise your legs. This can help the swelling go down.  Wear elastic bandages or support stockings as told by your doctor.  Eat a low-salt (low-sodium) diet to reduce fluid as told by your doctor.  Depending on the cause of your swelling, you may need to limit how much fluid you drink (fluid restriction).  Take over-the-counter and prescription medicines only as told by your doctor. Contact a doctor if:  Treatment is not working.  You have heart, liver, or kidney disease and have symptoms of edema.  You have sudden and unexplained weight gain. Get help right away if:  You have shortness of breath or chest pain.  You cannot breathe when you lie down.  You have pain, redness, or warmth in the swollen areas.  You have heart, liver, or kidney disease and get edema all of a sudden.  You have a fever and your symptoms get worse all of a sudden. Summary  Edema is when you have too much fluid in your body or under your skin.  Edema may make your legs, feet, and ankles swell  up. Swelling is also common in looser tissues, like around your eyes.  Raise (elevate) the swollen body part above the level of your heart when you are sitting or lying down.  Follow your doctor's instructions about diet and how much fluid you can drink (fluid restriction). This information is not intended to replace advice given to you by your health care provider. Make sure you discuss any questions you have with your health care provider. Document Released: 10/13/2007 Document Revised: 05/14/2016 Document Reviewed: 05/14/2016 Elsevier Interactive Patient Education  2017 Elsevier Inc.   Eczema Eczema is a broad term for a group of skin conditions that cause skin to become rough and inflamed. Each type of eczema has different triggers, symptoms, and treatments. Eczema of any type is usually itchy and symptoms range from mild to severe. Eczema and its symptoms are not spread from person to person (are not contagious). It can appear on different parts of the body at different times. Your eczema may not look the same as someone else's eczema. What are the types of eczema? Atopic dermatitis This is a long-term (chronic) skin disease that keeps coming back (recurring). Usual symptoms are dry skin and small, solid pimples that may swell and leak fluid (weep). Contact dermatitis This happens when something irritates the skin and causes a rash. The irritation can come from substances that you are allergic to (allergens), such as  poison ivy, chemicals, or medicines that were applied to your skin. Dyshidrotic eczema This is a form of eczema on the hands and feet. It shows up as very itchy, fluid-filled blisters. It can affect people of any age, but is more common before age 37. Hand eczema This causes very itchy areas of skin on the palms and sides of the hands and fingers. This type of eczema is common in industrial jobs where you may be exposed to many different types of irritants. Lichen simplex  chronicus This type of eczema occurs when a person constantly scratches one area of the body. Repeated scratching of the area leads to thickened skin (lichenification). Lichen simplex chronicus can occur along with other types of eczema. It is more common in adults, but may be seen in children as well. Nummular eczema This is a common type of eczema. It has no known cause. It typically causes a red, circular, crusty lesion (plaque) that may be itchy. Scratching may become a habit and can cause bleeding. Nummular eczema occurs most often in people of middle-age or older. It most often affects the hands. Seborrheic dermatitis This is a common skin disease that mainly affects the scalp. It may also affect any oily areas of the body, such as the face, sides of nose, eyebrows, ears, eyelids, and chest. It is marked by small scaling and redness of the skin (erythema). This can affect people of all ages. In infants, this condition is known as Location manager"cradle cap." Stasis dermatitis This is a common skin disease that usually appears on the legs and feet. It most often occurs in people who have a condition that prevents blood from being pumped through the veins in the legs (chronic venous insufficiency). Stasis dermatitis is a chronic condition that needs long-term management. How is eczema diagnosed? Your health care provider will examine your skin and review your medical history. He or she may also give you skin patch tests. These tests involve taking patches that contain possible allergens and placing them on your back. He or she will then check in a few days to see if an allergic reaction occurred. What are the common treatments? Treatment for eczema is based on the type of eczema you have. Hydrocortisone steroid medicine can relieve itching quickly and help reduce inflammation. This medicine may be prescribed or obtained over-the-counter, depending on the strength of the medicine that is needed. Follow these  instructions at home:  Take over-the-counter and prescription medicines only as told by your health care provider.  Use creams or ointments to moisturize your skin. Do not use lotions.  Learn what triggers or irritates your symptoms. Avoid these things.  Treat symptom flare-ups quickly.  Do not itch your skin. This can make your rash worse.  Keep all follow-up visits as told by your health care provider. This is important. Where to find more information:  The American Academy of Dermatology: InfoExam.siwww.aad.org  The National Eczema Association: www.nationaleczema.org Contact a health care provider if:  You have serious itching, even with treatment.  You regularly scratch your skin until it bleeds.  Your rash looks different than usual.  Your skin is painful, swollen, or more red than usual.  You have a fever. Summary  There are eight general types of eczema. Each type has different triggers.  Eczema of any type causes itching that may range from mild to severe.  Treatment varies based on the type of eczema you have. Hydrocortisone steroid medicine can help with itching and inflammation.  Protecting your skin is the best way to prevent eczema. Use moisturizers and lotions. Avoid triggers and irritants, and treat flare-ups quickly. This information is not intended to replace advice given to you by your health care provider. Make sure you discuss any questions you have with your health care provider. Document Released: 09/09/2016 Document Revised: 09/09/2016 Document Reviewed: 09/09/2016 Elsevier Interactive Patient Education  2018 [employer].

## 2017-11-11 ENCOUNTER — Other Ambulatory Visit: Payer: Self-pay

## 2017-11-11 ENCOUNTER — Ambulatory Visit (HOSPITAL_COMMUNITY)
Admission: EM | Admit: 2017-11-11 | Discharge: 2017-11-11 | Disposition: A | Discharge status: Home / Self Care | Payer: Self-pay | Attending: Family Medicine | Admitting: Family Medicine

## 2017-11-11 ENCOUNTER — Encounter (HOSPITAL_COMMUNITY): Payer: Self-pay | Admitting: Emergency Medicine

## 2017-11-11 ENCOUNTER — Encounter: Payer: Self-pay | Admitting: Family Medicine

## 2017-11-11 DIAGNOSIS — W57XXXA Bitten or stung by nonvenomous insect and other nonvenomous arthropods, initial encounter: Secondary | ICD-10-CM

## 2017-11-11 DIAGNOSIS — L03116 Cellulitis of left lower limb: Secondary | ICD-10-CM

## 2017-11-11 DIAGNOSIS — R609 Edema, unspecified: Secondary | ICD-10-CM | POA: Insufficient documentation

## 2017-11-11 DIAGNOSIS — S80862A Insect bite (nonvenomous), left lower leg, initial encounter: Secondary | ICD-10-CM

## 2017-11-11 MED ORDER — CEFTRIAXONE SODIUM 1 G IJ SOLR
1.0000 g | Freq: Once | INTRAMUSCULAR | Status: AC
  Administered 2017-11-11: 1 g via INTRAMUSCULAR

## 2017-11-11 MED ORDER — DOXYCYCLINE HYCLATE 100 MG PO CAPS: 100.0000 mg | ORAL_CAPSULE | Freq: Two times a day (BID) | ORAL | 0 refills | Status: DC

## 2017-11-11 MED ORDER — CEFTRIAXONE SODIUM 1 G IJ SOLR
INTRAMUSCULAR | Status: AC
  Filled 2017-11-11: qty 10

## 2017-11-11 MED ORDER — LIDOCAINE HCL (PF) 1 % IJ SOLN
INTRAMUSCULAR | Status: AC
  Filled 2017-11-11: qty 2

## 2017-11-11 NOTE — ED Provider Notes (Signed)
[facility]   [id number] 11/11/17 Arrival Time: 1725  SUBJECTIVE:  Tonya Owens is a 37 y.o. female who presents with a skin complaint that began a few days ago.  Patient reports insect bite.  Localizes the insect bite to left knee.  Describes it as painful, itchy, and redness.  Has tried topical alocohol without relief.  Denies aggravating factors.  Denies similar symptoms in the past.  Complains of associated swelling and erythema. Denies fever, chills, nausea, vomiting, swollen glands, SOB, chest pain, abdominal pain, changes in bowel or bladder function.     ROS: As per HPI.  Past Medical History:  Diagnosis Date  . Asthma   . Hypertension    History reviewed. No pertinent surgical history. Allergies  Allergen Reactions  . Omeprazole Hives and Shortness Of Breath  . Amlodipine     Jittery   No current facility-administered medications on file prior to encounter.    Current Outpatient Medications on File Prior to Encounter  Medication Sig Dispense Refill  . albuterol (PROVENTIL HFA;VENTOLIN HFA) 108 (90 Base) MCG/ACT inhaler Inhale 2 puffs into the lungs every 4 (four) hours as needed for wheezing or shortness of breath. 1 Inhaler 0  . metoprolol succinate (TOPROL-XL) 25 MG 24 hr tablet Take 1 tablet (25 mg total) daily by mouth. 90 tablet 3  . triamcinolone cream (KENALOG) 0.1 % Apply 1 application topically 2 (two) times daily. 30 g 0   Social History   Socioeconomic History  . Marital status: Single    Spouse name: Not on file  . Number of children: Not on file  . Years of education: Not on file  . Highest education level: Not on file  Occupational History  . Not on file  Social Needs  . Financial resource strain: Not on file  . Food insecurity:    Worry: Not on file    Inability: Not on file  . Transportation needs:    Medical: Not on file    Non-medical: Not on file  Tobacco Use  . Smoking status: Former Smoker    Packs/day: 0.50    Years:  13.00    Pack years: 6.50    Types: Cigarettes    Start date: 07/24/1996    Last attempt to quit: 06/10/2012    Years since quitting: 5.4  . Smokeless tobacco: Never Used  Substance and Sexual Activity  . Alcohol use: No  . Drug use: No  . Sexual activity: Yes    Birth control/protection: Condom  Lifestyle  . Physical activity:    Days per week: Not on file    Minutes per session: Not on file  . Stress: Not on file  Relationships  . Social connections:    Talks on phone: Not on file    Gets together: Not on file    Attends religious service: Not on file    Active member of club or organization: Not on file    Attends meetings of clubs or organizations: Not on file    Relationship status: Not on file  . Intimate partner violence:    Fear of current or ex partner: Not on file    Emotionally abused: Not on file    Physically abused: Not on file    Forced sexual activity: Not on file  Other Topics Concern  . Not on file  Social History Narrative  . Not on file   Family History  Problem Relation Age of Onset  . Hypertension Mother   .  Diabetes Father   . Diabetes Maternal Grandmother   . Hypertension Maternal Grandmother     OBJECTIVE: Vitals:   11/11/17 1820  BP: (!) 149/76  Pulse: (!) 114  Resp: 18  Temp: 100 F (37.8 C)  TempSrc: Oral  SpO2: 100%    General appearance: alert; no distress; nontoxic appearance   Lungs: clear to auscultation bilaterally Heart: regular rate and rhythm.  Radial pulse 2+ bilaterally Extremities: no edema Skin: warm and dry; papule localized to lateral aspect of left knee with surrounding erythema and area of induration approximately 4 x 4 cm; tender to palpation; no active drainage appreciated.   Psychological: alert and cooperative; normal mood and affect     ASSESSMENT & PLAN:  1. Cellulitis of left lower extremity   2. Insect bite of left lower leg, initial encounter    Discussed patient case with Dr. Delton SeeNelson.  Given  patient symptoms and presentation, ceftriaxone shot was given in office and patient was started on doxycycline.  Strict return and ER precautions given.    Meds ordered this encounter  Medications  . cefTRIAXone (ROCEPHIN) injection 1 g  . doxycycline (VIBRAMYCIN) 100 MG capsule    Sig: Take 1 capsule (100 mg total) by mouth 2 (two) times daily.    Dispense:  20 capsule    Refill:  0    Order Specific Question:   Supervising Provider    Answer:   Isa RankinMURRAY, LAURA WILSON [[id number]]   Ceftriaxone given in office Prescribed doxycycline take as directed and to completion Continue to alternate ibuprofen and tylenol as needed for pain and fever Follow up with PCP if symptoms persists Return or go to the ED if you have any new or worsening symptoms such as increased pain, redness, swelling, discharge, high fever, night sweats, abdominal pain, etc...  Reviewed expectations re: course of current medical issues. Questions answered. Outlined signs and symptoms indicating need for more acute intervention. Patient verbalized understanding. After Visit Summary given.   Rennis HardingWurst, Lindsea Olivar, PA-C 11/11/17 2116

## 2017-11-11 NOTE — ED Triage Notes (Signed)
[facility] a [facility] of [facility] possible insect bite on her left knee.

## 2017-11-11 NOTE — Discharge Instructions (Addendum)
Ceftriaxone given in office Prescribed doxycycline take as directed and to completion Continue to alternate ibuprofen and tylenol as needed for pain and fever Follow up with PCP if symptoms persists Return or go to the ED if you have any new or worsening symptoms such as increased pain, redness, swelling, discharge, high fever, night sweats, abdominal pain, etc...Marland Kitchen

## 2017-11-11 NOTE — Assessment & Plan Note (Signed)
Well controlled, continue Toprol

## 2017-11-11 NOTE — Assessment & Plan Note (Signed)
Trial of Kenalog. Dove soap only. Lukewarm showers, pat dry. Eucerin or Lubriderm applied daily.

## 2017-11-11 NOTE — Assessment & Plan Note (Signed)
LE--trial of compression hose, elevation, salt restriction

## 2017-11-11 NOTE — Progress Notes (Signed)
   Subjective:    Patient ID: Tonya Owens is a 37 y.o. female presenting with Eczema  on 11/09/2017  HPI: Here today with several issues. On toprol and for BP f/u. Also with an area of thickened skin. Has tried OTC steroid without help. Uses Oil of Olay soap. Very itchy area. Has h/o eczema. Also notes some swelling in her ankles. Worse with working all day and being on her feet. Thinks this is related to the itchy patch.  Review of Systems  Constitutional: Negative for chills and fever.  Respiratory: Negative for shortness of breath.   Cardiovascular: Negative for chest pain.  Gastrointestinal: Negative for abdominal pain, nausea and vomiting.  Genitourinary: Negative for dysuria.  Skin: Positive for rash.      Objective:    BP 110/80   Pulse (!) 115   Temp 98.7 F (37.1 C) (Oral)   Wt 229 lb (103.9 kg)   LMP 10/22/2017   SpO2 93%   BMI 38.11 kg/m  Physical Exam  Constitutional: She is oriented to person, place, and time. She appears well-developed and well-nourished. No distress.  HENT:  Head: Normocephalic and atraumatic.  Eyes: No scleral icterus.  Neck: Neck supple.  Cardiovascular: Normal rate.  Pulmonary/Chest: Effort normal.  Abdominal: Soft.  Musculoskeletal: She exhibits edema (1+).  Neurological: She is alert and oriented to person, place, and time.  Skin: Skin is warm and dry.  Thickened patch/area of lichenification noted on lateral right ankle  Psychiatric: She has a normal mood and affect.        Assessment & Plan:   Problem List Items Addressed This Visit      Unprioritized   Essential hypertension, benign    Well controlled, continue Toprol      Eczema - Primary    Trial of Kenalog. Dove soap only. Lukewarm showers, pat dry. Eucerin or Lubriderm applied daily.       Relevant Medications   triamcinolone cream (KENALOG) 0.1 %   Edema    LE--trial of compression hose, elevation, salt restriction          Total face-to-face time  with patient: 15 minutes. Over 50% of encounter was spent on counseling and coordination of care. Return in about 3 months (around 02/09/2018), or if symptoms worsen or fail to improve.  Reva Boresanya S Tonya Owens 11/11/2017 3:45 PM

## 2017-11-14 ENCOUNTER — Telehealth (HOSPITAL_COMMUNITY): Payer: Self-pay | Admitting: Emergency Medicine

## 2018-03-30 ENCOUNTER — Other Ambulatory Visit: Payer: Self-pay

## 2018-03-30 MED ORDER — METOPROLOL SUCCINATE ER 25 MG PO TB24: 25.0000 mg | ORAL_TABLET | Freq: Every day | ORAL | 3 refills | Status: DC

## 2018-04-05 ENCOUNTER — Inpatient Hospital Stay (HOSPITAL_COMMUNITY)
Admission: AD | Admit: 2018-04-05 | Discharge: 2018-04-05 | Disposition: A | Discharge status: Home / Self Care | Payer: Self-pay | Source: Ambulatory Visit | Attending: Family Medicine | Admitting: Family Medicine

## 2018-04-05 ENCOUNTER — Other Ambulatory Visit: Payer: Self-pay

## 2018-04-05 ENCOUNTER — Ambulatory Visit (INDEPENDENT_AMBULATORY_CARE_PROVIDER_SITE_OTHER): Payer: Self-pay

## 2018-04-05 DIAGNOSIS — Z87891 Personal history of nicotine dependence: Secondary | ICD-10-CM | POA: Insufficient documentation

## 2018-04-05 DIAGNOSIS — R8290 Unspecified abnormal findings in urine: Secondary | ICD-10-CM

## 2018-04-05 DIAGNOSIS — N898 Other specified noninflammatory disorders of vagina: Secondary | ICD-10-CM

## 2018-04-05 DIAGNOSIS — I10 Essential (primary) hypertension: Secondary | ICD-10-CM | POA: Insufficient documentation

## 2018-04-05 DIAGNOSIS — R350 Frequency of micturition: Secondary | ICD-10-CM | POA: Insufficient documentation

## 2018-04-05 DIAGNOSIS — B9689 Other specified bacterial agents as the cause of diseases classified elsewhere: Secondary | ICD-10-CM

## 2018-04-05 DIAGNOSIS — N760 Acute vaginitis: Secondary | ICD-10-CM

## 2018-04-05 DIAGNOSIS — R300 Dysuria: Secondary | ICD-10-CM | POA: Insufficient documentation

## 2018-04-05 DIAGNOSIS — Z79899 Other long term (current) drug therapy: Secondary | ICD-10-CM | POA: Insufficient documentation

## 2018-04-05 DIAGNOSIS — Z113 Encounter for screening for infections with a predominantly sexual mode of transmission: Secondary | ICD-10-CM

## 2018-04-05 DIAGNOSIS — Z888 Allergy status to other drugs, medicaments and biological substances status: Secondary | ICD-10-CM | POA: Insufficient documentation

## 2018-04-05 DIAGNOSIS — B373 Candidiasis of vulva and vagina: Secondary | ICD-10-CM

## 2018-04-05 DIAGNOSIS — J45909 Unspecified asthma, uncomplicated: Secondary | ICD-10-CM | POA: Insufficient documentation

## 2018-04-05 LAB — POCT URINALYSIS DIP (DEVICE)
Bilirubin Urine: NEGATIVE
GLUCOSE, UA: NEGATIVE mg/dL
HGB URINE DIPSTICK: NEGATIVE
Nitrite: POSITIVE — AB
Protein, ur: NEGATIVE mg/dL
SPECIFIC GRAVITY, URINE: 1.025 (ref 1.005–1.030)
UROBILINOGEN UA: 0.2 mg/dL (ref 0.0–1.0)
pH: 5 (ref 5.0–8.0)

## 2018-04-05 MED ORDER — FLUCONAZOLE 150 MG PO TABS: 150.0000 mg | ORAL_TABLET | Freq: Once | ORAL | 0 refills | Status: AC

## 2018-04-05 NOTE — Progress Notes (Signed)
I have reviewed the chart and agree with nursing staff's documentation of this patient's encounter.  Daliana Leverett, CNM 04/05/2018 6:54 PM    

## 2018-04-05 NOTE — MAU Note (Signed)
Thinks she is has a UTI.  Urine is darker, doesn't hurt when she pees. Is having vaginal itching.

## 2018-04-05 NOTE — Progress Notes (Signed)
Pt here today reports vaginal discharge with some itching.  Pt also reports that she thinks that she may have a UTI as well.  Urinalysis abnormal and urine sent for cx.  Self swab obtained and pt informed that she will call her with abnormal results.  Per Thressa ShellerHeather Hogan, CNM an Rx for Diflucan can be prescribed.  Pt agreed with no further questions.

## 2018-04-05 NOTE — MAU Provider Note (Signed)
Chief Complaint: Vaginal Itching   First Provider Initiated Contact with Patient 04/05/18 1755      SUBJECTIVE HPI: TAJHA SAMMARCO is a 37 y.o. G3P2011 who presents to maternity admissions for dysuria and frequency. Denies any other gyn complaints.  Past Medical History:  Diagnosis Date  . Asthma   . Hypertension    No past surgical history on file. Social History   Socioeconomic History  . Marital status: Single    Spouse name: Not on file  . Number of children: Not on file  . Years of education: Not on file  . Highest education level: Not on file  Occupational History  . Not on file  Social Needs  . Financial resource strain: Not on file  . Food insecurity:    Worry: Not on file    Inability: Not on file  . Transportation needs:    Medical: Not on file    Non-medical: Not on file  Tobacco Use  . Smoking status: Former Smoker    Packs/day: 0.50    Years: 13.00    Pack years: 6.50    Types: Cigarettes    Start date: 07/24/1996    Last attempt to quit: 06/10/2012    Years since quitting: 5.8  . Smokeless tobacco: Never Used  Substance and Sexual Activity  . Alcohol use: No  . Drug use: No  . Sexual activity: Yes    Birth control/protection: Condom  Lifestyle  . Physical activity:    Days per week: Not on file    Minutes per session: Not on file  . Stress: Not on file  Relationships  . Social connections:    Talks on phone: Not on file    Gets together: Not on file    Attends religious service: Not on file    Active member of club or organization: Not on file    Attends meetings of clubs or organizations: Not on file    Relationship status: Not on file  . Intimate partner violence:    Fear of current or ex partner: Not on file    Emotionally abused: Not on file    Physically abused: Not on file    Forced sexual activity: Not on file  Other Topics Concern  . Not on file  Social History Narrative  . Not on file   No current facility-administered  medications on file prior to encounter.    Current Outpatient Medications on File Prior to Encounter  Medication Sig Dispense Refill  . albuterol (PROVENTIL HFA;VENTOLIN HFA) 108 (90 Base) MCG/ACT inhaler Inhale 2 puffs into the lungs every 4 (four) hours as needed for wheezing or shortness of breath. 1 Inhaler 0  . doxycycline (VIBRAMYCIN) 100 MG capsule Take 1 capsule (100 mg total) by mouth 2 (two) times daily. 20 capsule 0  . metoprolol succinate (TOPROL-XL) 25 MG 24 hr tablet Take 1 tablet (25 mg total) by mouth daily. 90 tablet 3  . triamcinolone cream (KENALOG) 0.1 % Apply 1 application topically 2 (two) times daily. 30 g 0   Allergies  Allergen Reactions  . Omeprazole Hives and Shortness Of Breath  . Amlodipine     Jittery    ROS:  Review of Systems  Genitourinary: Positive for dysuria and frequency.    I have reviewed patient's Past Medical Hx, Surgical Hx, Family Hx, Social Hx, medications and allergies.   Physical Exam   Patient Vitals for the past 24 hrs:  BP Temp Temp src Pulse Resp SpO2  Weight  04/05/18 1751 (!) 142/81 98.4 F (36.9 C) Oral 97 18 100 % 105.7 kg   MDM Patient denies any concerning symptoms in need of emergent evaluation. Patient advised that she may wait for a room in MAU or may choose to go to after hours clinic to be seen. Patient going to go downstairs now.   ASSESSMENT MSE Complete  PLAN Discharge patient at her request to seek non-emergent medical care elsewhere  Rennie Plowmaneill, Jonda Alanis M, CNM 04/05/2018 5:56 PM

## 2018-04-07 LAB — URINE CULTURE

## 2018-04-08 ENCOUNTER — Other Ambulatory Visit: Payer: Self-pay | Admitting: Advanced Practice Midwife

## 2018-04-08 MED ORDER — SULFAMETHOXAZOLE-TRIMETHOPRIM 800-160 MG PO TABS: 1.0000 | ORAL_TABLET | Freq: Two times a day (BID) | ORAL | 0 refills | Status: DC

## 2018-04-08 NOTE — Progress Notes (Signed)
+  Urine culture results. Bactrim DS sent to pharmacy on file. My chart message sent to patient.   Thressa ShellerHeather Kallan Bischoff 10:14 AM 04/08/18

## 2018-04-10 ENCOUNTER — Other Ambulatory Visit: Payer: Self-pay | Admitting: Family Medicine

## 2018-04-10 LAB — CERVICOVAGINAL ANCILLARY ONLY
BACTERIAL VAGINITIS: POSITIVE — AB
Candida vaginitis: POSITIVE — AB
Chlamydia: NEGATIVE
Neisseria Gonorrhea: NEGATIVE
Trichomonas: NEGATIVE

## 2018-04-10 MED ORDER — METRONIDAZOLE 500 MG PO TABS: 500.0000 mg | ORAL_TABLET | Freq: Two times a day (BID) | ORAL | 0 refills | Status: DC

## 2018-04-10 MED ORDER — TERCONAZOLE 0.4 % VA CREA: 1.0000 | TOPICAL_CREAM | Freq: Every day | VAGINAL | 9 refills | Status: AC

## 2018-04-10 NOTE — Addendum Note (Signed)
Addended by: Thressa ShellerHOGAN, Cassian Torelli D on: 04/10/2018 02:33 PM   Modules accepted: Orders

## 2018-09-15 ENCOUNTER — Other Ambulatory Visit: Payer: Self-pay

## 2018-09-15 ENCOUNTER — Ambulatory Visit (INDEPENDENT_AMBULATORY_CARE_PROVIDER_SITE_OTHER): Payer: Self-pay | Admitting: Family Medicine

## 2018-09-15 VITALS — BP 130/76 | HR 71

## 2018-09-15 DIAGNOSIS — R8290 Unspecified abnormal findings in urine: Secondary | ICD-10-CM

## 2018-09-15 DIAGNOSIS — H60391 Other infective otitis externa, right ear: Secondary | ICD-10-CM

## 2018-09-15 LAB — POCT URINALYSIS DIP (MANUAL ENTRY)
Bilirubin, UA: NEGATIVE
Blood, UA: NEGATIVE
Glucose, UA: NEGATIVE mg/dL
Ketones, POC UA: NEGATIVE mg/dL
Nitrite, UA: NEGATIVE
Protein Ur, POC: NEGATIVE mg/dL
Spec Grav, UA: 1.02 (ref 1.010–1.025)
Urobilinogen, UA: 0.2 E.U./dL
pH, UA: 6.5 (ref 5.0–8.0)

## 2018-09-15 LAB — POCT UA - MICROSCOPIC ONLY: Epithelial cells, urine per micros: >20

## 2018-09-15 MED ORDER — NEOMYCIN-POLYMYXIN-HC 3.5-10000-1 OT SOLN: 3.0000 [drp] | Freq: Four times a day (QID) | OTIC | 0 refills | Status: AC

## 2018-09-15 NOTE — Progress Notes (Signed)
   CC: ear fullness  HPI  R ear feels stopped up. No pain. She did stick a bobby pin and qtip in it. It is does itch a lot. She has allergies. No previous ENT problems or ear problems. No drainage.   Malodorous urine - she thinks this was similar to when she went to the MAU for UTI in November, was found to have klebsiella then, treated with bactrim. No fever, no abd or back pain, maybe slightly more frequent urination than previous.   ROS: Denies CP, SOB, abdominal pain, dysuria, changes in BMs.   CC, SH/smoking status, and VS noted  Objective: BP 130/76   Pulse 71   LMP 09/01/2018 (Approximate)   SpO2 99%  Gen: NAD, alert, cooperative, and pleasant. HEENT: NCAT, EOMI, PERRL, R TM normal and canal normal. L canal red and swollen, no drainage, TM partially visualized due to swelling but appears normal.  CV: RRR, no murmur Resp: CTAB, no wheezes, non-labored Abd: SNTND, BS present, no guarding or organomegaly Ext: No edema, warm Neuro: Alert and oriented, Speech clear, No gross deficits  Assessment and plan:  Malodorous urine- less likely UTI based on UA (this was the patient's worry). May be related to diet or relative dehydration. Increase water intake.   Otitis externa- exam c/w such. Treat with cortisporin, patient advised to use goodrx due to cost. Return if no improvement.   Orders Placed This Encounter  Procedures  . POCT urinalysis dipstick  . POCT UA - Microscopic Only    Meds ordered this encounter  Medications  . neomycin-polymyxin-hydrocortisone (CORTISPORIN) OTIC solution    Sig: Place 3 drops into the right ear 4 (four) times daily for 7 days.    Dispense:  10 mL    Refill:  0     Loni Muse, MD, PGY3 09/15/2018 4:27 PM

## 2018-09-15 NOTE — Progress Notes (Deleted)
   CC:   HPI  R ear feels stopped up. No pain. She did stick a bobby pin and qtip in it. It is does itch a lot. She has allergies. No previous ENT problems or ear problems.   ROS: ***Denies CP, SOB, abdominal pain, dysuria, changes in BMs.   CC, SH/smoking status, and VS noted  Objective: BP 130/76   Pulse 71   LMP 09/01/2018 (Approximate)   SpO2 99%  Gen: NAD, alert, cooperative, and pleasant.*** HEENT: NCAT, EOMI, PERRL CV: RRR, no murmur Resp: CTAB, no wheezes, non-labored Abd: SNTND, BS present, no guarding or organomegaly Ext: No edema, warm Neuro: Alert and oriented, Speech clear, No gross deficits  Assessment and plan:  No problem-specific Assessment & Plan notes found for this encounter.   No orders of the defined types were placed in this encounter.   No orders of the defined types were placed in this encounter.   Health Maintenance reviewed - {health maintenance:315237}.  Loni Muse, MD, PGY3 09/15/2018 2:37 PM

## 2018-09-15 NOTE — Patient Instructions (Signed)
You have an infection of the skin of the outside ear canal. Use the drops as prescribed. Call if no improvement. It doesn't look like you have a UTI today.

## 2018-09-18 ENCOUNTER — Telehealth: Payer: Self-pay

## 2018-09-18 NOTE — Telephone Encounter (Signed)
Pt calls nurse line stating she was seen on Friday for an ear infection by Chanetta Marshall. Pt stated she started the drops on Friday, however she has not felt any relief. Pt stated now her ear is beginning to "throb" at night. Pt is requesting an antibiotic to be sent to her pharmacy of possible.   [facility] [address] and [place].

## 2018-09-18 NOTE — Telephone Encounter (Signed)
Called patient back - she says her ear still feels full. She picked up the ear drops Friday. She thinks she has only used the ear drops once or twice. She doesn't like the feeling of the ear being full with the drops in. She requests an oral abx. I counseled her that using the drops once or twice is not enough of a dose to treat OE, and she should use them QID as directed before we determine that this is a treatment failure. I expect her ear to feel funny as the drop is instilled. She will try to use more frequently and call back weds if no improvement.

## 2019-04-02 ENCOUNTER — Other Ambulatory Visit: Payer: Self-pay | Admitting: *Deleted

## 2019-04-02 MED ORDER — METOPROLOL SUCCINATE ER 25 MG PO TB24: 25.0000 mg | ORAL_TABLET | Freq: Every day | ORAL | 3 refills | Status: DC

## 2019-07-30 ENCOUNTER — Encounter: Payer: Self-pay | Admitting: Family Medicine

## 2019-07-30 ENCOUNTER — Ambulatory Visit (INDEPENDENT_AMBULATORY_CARE_PROVIDER_SITE_OTHER): Payer: Self-pay | Admitting: Family Medicine

## 2019-07-30 ENCOUNTER — Other Ambulatory Visit: Payer: Self-pay

## 2019-07-30 ENCOUNTER — Ambulatory Visit: Payer: Self-pay | Admitting: Family Medicine

## 2019-07-30 ENCOUNTER — Other Ambulatory Visit (HOSPITAL_COMMUNITY)
Admission: RE | Admit: 2019-07-30 | Discharge: 2019-07-30 | Disposition: A | Discharge status: Home / Self Care | Payer: Self-pay | Source: Ambulatory Visit | Attending: Family Medicine | Admitting: Family Medicine

## 2019-07-30 VITALS — BP 140/64 | HR 105 | Ht 65.0 in | Wt 226.1 lb

## 2019-07-30 DIAGNOSIS — R87810 Cervical high risk human papillomavirus (HPV) DNA test positive: Secondary | ICD-10-CM | POA: Insufficient documentation

## 2019-07-30 DIAGNOSIS — Z113 Encounter for screening for infections with a predominantly sexual mode of transmission: Secondary | ICD-10-CM

## 2019-07-30 DIAGNOSIS — N898 Other specified noninflammatory disorders of vagina: Secondary | ICD-10-CM

## 2019-07-30 DIAGNOSIS — I10 Essential (primary) hypertension: Secondary | ICD-10-CM

## 2019-07-30 LAB — POCT GLYCOSYLATED HEMOGLOBIN (HGB A1C): HbA1c, POC (controlled diabetic range): 6.1 % (ref 0.0–7.0)

## 2019-07-30 NOTE — Progress Notes (Signed)
   SUBJECTIVE:   CHIEF COMPLAINT / HPI:   Prediabetes: Patient reports some change in urine color, odor and frequency and is concerned she may have diabetes given her family history. She would like for [place] to recheck today. Given her urinary concerns, but she dos state that she believes she may not be drinking enough water ROS: denies dizziness, diaphoresis, LOC, polyuria, polydipsia  Vaginal Itching - has been ongoing for days  - Denies burning, abdominal pain, nausea or vomiting. Denies burning with urination, hematuria.  - Does report change in urine color or smell.  - Patient reports no STD in the past.   - LMP was was normal for patient.   PERTINENT  PMH / PSH: HTN, asthma, prediabetes  OBJECTIVE:  BP 140/64 Comment: pt stated she hasnt had BP med yet  Pulse (!) 105   Ht 5\' 5"  (1.651 m)   Wt 226 lb 2 oz (102.6 kg)   LMP 07/27/2019   SpO2 99%   BMI 37.63 kg/m   General: NAD, pleasant Neck: Supple, no LAD Respiratory: normal work of breathing Gastrointestinal: soft, nontender, nondistended,  GU/GYN: External genitalia within normal limits.  Vaginal mucosa pink, moist, normal rugae.  Nonfriable cervix without lesions, no discharge or bleeding noted on speculum exam. No adnexal masses bilaterally. Exam performed in the presence of a chaperone. Neuro: CN II-XII grossly intact Psych: AOx3, appropriate affect  ASSESSMENT/PLAN:   Vaginal itching Unclear etiology.  Patient overall reassured regarding her urine color and odor as it is likely related to her not drinking enough water or what she is eating. Vaginal exam grossly normal.  -Pap smear performed which was normal, repeat in 3 years.  -STD testing iwth HIV, GC/chlamydia and trich all neg.  -RPR positive, treponemal negative, likely false + repeat in April.  Prediabetes: Patient with A1c of 6.1 Given lifestyle modifications and recommended exercise. Patient does not wish to initiate medication at this time given her last  check 6 years ago also had an A1c of 6.1.   May Willamina Grieshop, DO PGY-3, [facility]

## 2019-07-30 NOTE — Patient Instructions (Signed)
Thank you for coming to see me today. It was a pleasure! Today we talked about:   The different appearance of your urine is likely due to you not drinking enough water.  You still have an A1c of 6.1 which falls under the prediabetic range but is not diabetic.  Please continue with appropriate lifestyle changes such as avoiding too much starch and exercising regularly.   We will release your results on MyChart unless there is something abnormal.  Please follow-up as needed.  If you have any questions or concerns, please do not hesitate to call the office at [phone].  Take Care,   [place] Jermany Sundell, DO  Prediabetes Prediabetes is the condition of having a blood sugar (blood glucose) level that is higher than it should be, but not high enough for you to be diagnosed with type 2 diabetes. Having prediabetes puts you at risk for developing type 2 diabetes (type 2 diabetes mellitus). Prediabetes may be called impaired glucose tolerance or impaired fasting glucose. Prediabetes usually does not cause symptoms. Your health care provider can diagnose this condition with blood tests. You may be tested for prediabetes if you are overweight and if you have at least one other risk factor for prediabetes. What is blood glucose, and how is it measured? Blood glucose refers to the amount of glucose in your bloodstream. Glucose comes from eating foods that contain sugars and starches (carbohydrates), which the body breaks down into glucose. Your blood glucose level may be measured in mg/dL (milligrams per deciliter) or mmol/L (millimoles per liter). Your blood glucose may be checked with one or more of the following blood tests:  A fasting blood glucose (FBG) test. You will not be allowed to eat (you will fast) for 8 hours or longer before a blood sample is taken. ? A normal range for FBG is 70-100 mg/dl (3.9-5.6 mmol/L).  An A1c (hemoglobin A1c) blood test. This test provides information about blood  glucose control over the previous 2?43months.  An oral glucose tolerance test (OGTT). This test measures your blood glucose at two times: ? After fasting. This is your baseline level. ? Two hours after you drink a beverage that contains glucose. You may be diagnosed with prediabetes:  If your FBG is 100?125 mg/dL (5.6-6.9 mmol/L).  If your A1c level is 5.7?6.4%.  If your OGTT result is 140?199 mg/dL (7.8-11 mmol/L). These blood tests may be repeated to confirm your diagnosis. How can this condition affect me? The pancreas produces a hormone (insulin) that helps to move glucose from the bloodstream into cells. When cells in the body do not respond properly to insulin that the body makes (insulin resistance), excess glucose builds up in the blood instead of going into cells. As a result, high blood glucose (hyperglycemia) can develop, which can cause many complications. Hyperglycemia is a symptom of prediabetes. Having high blood glucose for a long time is dangerous. Too much glucose in your blood can damage your nerves and blood vessels. Long-term damage can lead to complications from diabetes, which may include:  Heart disease.  Stroke.  Blindness.  Kidney disease.  Depression.  Poor circulation in the feet and legs, which could lead to surgical removal (amputation) in severe cases. What can increase my risk? Risk factors for prediabetes include:  Having a family member with type 2 diabetes.  Being overweight or obese.  Being older than age 39.  Being of American [place], African-American, Hispanic/Latino, or Asian/Pacific Islander descent.  Having an inactive (sedentary) lifestyle.  Having a history of heart disease.  History of gestational diabetes or polycystic ovary syndrome (PCOS), in women.  Having low levels of good cholesterol (HDL-C) or high levels of blood fats (triglycerides).  Having high blood pressure. What actions can I take to prevent diabetes?       Be physically active. ? Do moderate-intensity physical activity for 30 or more minutes on 5 or more days of the week, or as much as told by your health care provider. This could be brisk walking, biking, or water aerobics. ? Ask your health care provider what activities are safe for you. A mix of physical activities may be best, such as walking, swimming, cycling, and strength training.  Lose weight as told by your health care provider. ? Losing 5-7% of your body weight can reverse insulin resistance. ? Your health care provider can determine how much weight loss is best for you and can help you lose weight safely.  Follow a healthy meal plan. This includes eating lean proteins, complex carbohydrates, fresh fruits and vegetables, low-fat dairy products, and healthy fats. ? Follow instructions from your health care provider about eating or drinking restrictions. ? Make an appointment to see a diet and nutrition specialist (registered dietitian) to help you create a healthy eating plan that is right for you.  Do not smoke or use any tobacco products, such as cigarettes, chewing tobacco, and e-cigarettes. If you need help quitting, ask your health care provider.  Take over-the-counter and prescription medicines as told by your health care provider. You may be prescribed medicines that help lower the risk of type 2 diabetes.  Keep all follow-up visits as told by your health care provider. This is important. Summary  Prediabetes is the condition of having a blood sugar (blood glucose) level that is higher than it should be, but not high enough for you to be diagnosed with type 2 diabetes.  Having prediabetes puts you at risk for developing type 2 diabetes (type 2 diabetes mellitus).  To help prevent type 2 diabetes, make lifestyle changes such as being physically active and eating a healthy diet. Lose weight as told by your health care provider. This information is not intended to replace  advice given to you by your health care provider. Make sure you discuss any questions you have with your health care provider. Document Revised: 08/18/2018 Document Reviewed: 06/17/2015 Elsevier Patient Education  2020 [employer].

## 2019-07-31 LAB — HIV ANTIBODY (ROUTINE TESTING W REFLEX): HIV Screen 4th Generation wRfx: NONREACTIVE

## 2019-08-01 ENCOUNTER — Telehealth: Payer: Self-pay | Admitting: Family Medicine

## 2019-08-01 DIAGNOSIS — B373 Candidiasis of vulva and vagina: Secondary | ICD-10-CM

## 2019-08-01 DIAGNOSIS — B3731 Acute candidiasis of vulva and vagina: Secondary | ICD-10-CM

## 2019-08-01 LAB — CYTOLOGY - PAP
Chlamydia: NEGATIVE
Comment: NEGATIVE
Comment: NEGATIVE
Comment: NEGATIVE
Comment: NORMAL
Diagnosis: NEGATIVE
High risk HPV: NEGATIVE
Neisseria Gonorrhea: NEGATIVE
Trichomonas: NEGATIVE

## 2019-08-01 LAB — RPR, QUANT+TP ABS (REFLEX)
Rapid Plasma Reagin, Quant: 1:1 {titer} — ABNORMAL HIGH
T Pallidum Abs: NONREACTIVE

## 2019-08-01 LAB — RPR: RPR Ser Ql: REACTIVE — AB

## 2019-08-01 MED ORDER — FLUCONAZOLE 150 MG PO TABS: 150.0000 mg | ORAL_TABLET | Freq: Once | ORAL | 0 refills | Status: AC

## 2019-08-01 NOTE — Telephone Encounter (Signed)
Called with results.  RPR positive, T. Pallidum negative, likely false positive. Discussed with patient. Will repeat at follow up.   Rx for fluconazole.  Terisa Starr, MD  Family Medicine Teaching Service

## 2019-08-02 DIAGNOSIS — N898 Other specified noninflammatory disorders of vagina: Secondary | ICD-10-CM | POA: Insufficient documentation

## 2019-08-02 NOTE — Assessment & Plan Note (Addendum)
Unclear etiology.  Patient overall reassured regarding her urine color and odor as it is likely related to her not drinking enough water or what she is eating. Vaginal exam grossly normal.  -Pap smear performed which was normal, repeat in 3 years.  -STD testing iwth HIV, GC/chlamydia and trich all neg.  -RPR positive, treponemal negative, likely false + repeat in April.

## 2019-08-16 ENCOUNTER — Encounter: Payer: Self-pay | Admitting: Family Medicine

## 2019-08-17 ENCOUNTER — Encounter: Payer: Self-pay | Admitting: Family Medicine

## 2019-08-30 ENCOUNTER — Telehealth: Payer: Self-pay | Admitting: *Deleted

## 2019-08-30 NOTE — Telephone Encounter (Signed)
-----   Message from Westley Chandler, MD sent at 08/29/2019  9:06 PM EDT ----- Regarding: Appointment Hello Red Team,  Please call patient and help her to schedule appointment in May. She is due for repeat labs.  Thank you, Terisa Starr, MD  [facility] Medicine Teaching Service

## 2019-08-30 NOTE — Telephone Encounter (Signed)
Pt informed and scheduled. Sufyan Meidinger, CMA  

## 2019-09-17 ENCOUNTER — Other Ambulatory Visit: Payer: Self-pay

## 2019-09-17 ENCOUNTER — Ambulatory Visit: Payer: Medicaid Other

## 2019-09-17 ENCOUNTER — Ambulatory Visit: Payer: Self-pay | Admitting: Family Medicine

## 2019-09-17 ENCOUNTER — Encounter: Payer: Self-pay | Admitting: Family Medicine

## 2019-09-17 ENCOUNTER — Ambulatory Visit (INDEPENDENT_AMBULATORY_CARE_PROVIDER_SITE_OTHER): Payer: Self-pay | Admitting: Family Medicine

## 2019-09-17 VITALS — BP 110/72 | HR 100 | Ht 65.0 in | Wt 229.2 lb

## 2019-09-17 DIAGNOSIS — F331 Major depressive disorder, recurrent, moderate: Secondary | ICD-10-CM

## 2019-09-17 DIAGNOSIS — Z113 Encounter for screening for infections with a predominantly sexual mode of transmission: Secondary | ICD-10-CM

## 2019-09-17 DIAGNOSIS — I10 Essential (primary) hypertension: Secondary | ICD-10-CM

## 2019-09-17 NOTE — Patient Instructions (Signed)
It was great to see you!  Our plans for today:  - Check out the resources listed below.    We are checking some labs today, we will call you or send you a letter if they are abnormal.   Take care and seek immediate care sooner if you develop any concerns.   Dr. Moll[facility]   Therapy and Counseling Resources Most providers on this list will take Medicaid. Patients with commercial insurance or Medicare should contact their insurance company to get a list of in network providers.  [facility]  [address] Suite [place], [place] [address]      [phone]  [facility] [address]  [place], [place] [address] [phone]  [facility] [address], [address]  [place], [place] [address]       [phone]     [facility] [address], [place], [place]  [phone]    [facility] [address], [place], [place] [phone]  [facility]:  [address] [place] [phone]  [facility]:  [address] [place], [place] [phone]  [facility] (under & uninsured) [address]   [place] [phone]    [email or web address]    [facility] [place] -[address]     Phone:  [phone]     [facility]-  [address]  [phone]   [facility] [address]      [place], [place] [phone]  [facility]: [address] [place], [place], [place]  [phone]   [facility] (Spanish therapist) [address] [place]  [address]   [place] [address]    [phone]    [facility]   [address],  [place], [place]  [phone]   [facility]  [address] [place]  [phone]  [facility]  [address] [place], [place]        [phone]  [facility] under & uninsured [place], To schedule an appointment call [phone]- [phone] [address] [place] [phone]):  Macario Golds from 8 AM - 3 PM Moving June 1 to [employer] [facility] at [facility] [address]  [facility] [address],  (Spanish)   [address], [place]: [phone]) 8:30 - 12; 1 - 2:30  [facility] [employer],  [address], [place]    ([phone]):8:30 - 12; 2 - 3PM  [facility],  [address],  [place]; [phone]):   Mon - Fri 8 AM - 5 PM  [facility] [address] [place]  MWF 12:30 to 3:00 or call to schedule an appointment  [phone]  Specific Provider options Psychology Today  [email or web address] 1. click on find a therapist  2. enter your zip code 3. left side and select or tailor a therapist for your specific need.   [facility] Provider Directory [email or web address]  (Medicaid)   Follow all drop down to find a provider  Social Support program [facility] [place] [phone] or [employer] [address], [place], [place] Recovery support and educational   In home counseling [facility] Telephone: [phone]  office in [email or web address] [email or web address]   Does not  take reg. Medicaid or Medicare private insurance [facility], [place] health Choice, [facility], [place], [place], [place], [place] Health Choice  24- Hour Availability:  . [place] or [phone]  . [facility] [employer] [phone]  [facility]  3043202451   . [place]  [phone] (after hours)  . [facility]/Mobile Crisis   [phone]  . [place] National Suicide Hotline  (680)451-0726 ([place])  . Call 911 or go to emergency room  . [employer]  [phone]);  [place] and [employer]   . [facility]  [phone]); [place], [place], [place], [place], [place], [place], [place]

## 2019-09-17 NOTE — Progress Notes (Signed)
    SUBJECTIVE:   CHIEF COMPLAINT / HPI:   Patient initially here for lab visit only however had positive PHQ9 with positive #9.  States she has h/o depression that started after loss of unborn child in 2007, recently exacerbated by caring for mentally ill mother who has diagnosis of bipolar and schizophrenia, not currently on meds. She also states her boyfriend of 10 years recently cheated on her. She is not currently on medication for depression or in counseling. Has had some thoughts that she would be better off dead but has never attempted or had plans to end her life. She has a daughter in college who will graduate next year and she is looking forward to seeing her graduate. She is interested in establishing with counseling.  PERTINENT  PMH / PSH: HTN, asthma, depression, tobacco use, obesity  OBJECTIVE:   BP 110/72   Pulse 100   Ht 5\' 5"  (1.651 m)   Wt 229 lb 4 oz (104 kg)   LMP 09/15/2019   SpO2 99%   BMI 38.15 kg/m   Gen: obese, in NAD Psych: appropriately dressed and well groomed, no flight of ideas or tangential thought process. Mood and affect congruent.   Depression screen [facility] 2/9 09/17/2019 09/15/2018 09/15/2018  Decreased Interest 2 - 0  Down, Depressed, Hopeless 2 1 0  PHQ - 2 Score 4 1 0  Altered sleeping 3 - -  Tired, decreased energy 3 - -  Change in appetite 0 - -  Feeling bad or failure about yourself  2 - -  Trouble concentrating 0 - -  Moving slowly or fidgety/restless 0 - -  Suicidal thoughts 2 - -  PHQ-9 Score 14 - -  Difficult doing work/chores Somewhat difficult - -     ASSESSMENT/PLAN:   Depression PHQ 14 with h/o suicidal thoughts but without current thoughts or plan. Protective factors include future plans with daughter. Discussed starting medication today however patient elects to try counseling first, resources provided. Provided suicide hotline and emergency precautions. F/u with PCP once established with counseling.     11/15/2018,  DO [facility]

## 2019-09-17 NOTE — Progress Notes (Deleted)
    SUBJECTIVE:   CHIEF COMPLAINT / HPI:   Here for repeat labs.  PERTINENT  PMH / PSH: HTN, asthma, carpal tunnel syndrome, obesity, tobacco use, depression  OBJECTIVE:   There were no vitals taken for this visit.  ***  ASSESSMENT/PLAN:   No problem-specific Assessment & Plan notes found for this encounter.     Ellwood Dense, DO [place] [facility]

## 2019-09-18 LAB — LIPID PANEL
Chol/HDL Ratio: 4.4 ratio (ref 0.0–4.4)
Cholesterol, Total: 174 mg/dL (ref 100–199)
HDL: 40 mg/dL (ref >39)
LDL Chol Calc (NIH): 117 mg/dL — ABNORMAL HIGH (ref 0–99)
Triglycerides: 92 mg/dL (ref 0–149)
VLDL Cholesterol Cal: 17 mg/dL (ref 5–40)

## 2019-09-18 LAB — BASIC METABOLIC PANEL
BUN/Creatinine Ratio: 8 — ABNORMAL LOW (ref 9–23)
BUN: 6 mg/dL (ref 6–20)
CO2: 22 mmol/L (ref 20–29)
Calcium: 9 mg/dL (ref 8.7–10.2)
Chloride: 106 mmol/L (ref 96–106)
Creatinine, Ser: 0.78 mg/dL (ref 0.57–1.00)
GFR calc Af Amer: 111 mL/min/{1.73_m2} (ref >59)
GFR calc non Af Amer: 96 mL/min/{1.73_m2} (ref >59)
Glucose: 77 mg/dL (ref 65–99)
Potassium: 3.9 mmol/L (ref 3.5–5.2)
Sodium: 140 mmol/L (ref 134–144)

## 2019-09-18 LAB — RPR: RPR Ser Ql: NONREACTIVE

## 2019-09-19 NOTE — Assessment & Plan Note (Signed)
PHQ 14 with h/o suicidal thoughts but without current thoughts or plan. Protective factors include future plans with daughter. Discussed starting medication today however patient elects to try counseling first, resources provided. Provided suicide hotline and emergency precautions. F/u with PCP once established with counseling.

## 2020-04-11 ENCOUNTER — Inpatient Hospital Stay (HOSPITAL_COMMUNITY)
Admission: AD | Admit: 2020-04-11 | Discharge: 2020-04-11 | Disposition: A | Discharge status: Home / Self Care | Payer: Medicaid Other | Attending: Obstetrics & Gynecology | Admitting: Obstetrics & Gynecology

## 2020-04-11 ENCOUNTER — Other Ambulatory Visit: Payer: Self-pay

## 2020-04-11 ENCOUNTER — Encounter (HOSPITAL_COMMUNITY): Payer: Self-pay | Admitting: Obstetrics & Gynecology

## 2020-04-11 ENCOUNTER — Inpatient Hospital Stay (HOSPITAL_COMMUNITY): Payer: Medicaid Other

## 2020-04-11 DIAGNOSIS — O99891 Other specified diseases and conditions complicating pregnancy: Secondary | ICD-10-CM

## 2020-04-11 DIAGNOSIS — D509 Iron deficiency anemia, unspecified: Secondary | ICD-10-CM | POA: Diagnosis not present

## 2020-04-11 DIAGNOSIS — O00101 Right tubal pregnancy without intrauterine pregnancy: Secondary | ICD-10-CM | POA: Diagnosis present

## 2020-04-11 DIAGNOSIS — O2311 Infections of bladder in pregnancy, first trimester: Secondary | ICD-10-CM | POA: Insufficient documentation

## 2020-04-11 DIAGNOSIS — O99011 Anemia complicating pregnancy, first trimester: Secondary | ICD-10-CM | POA: Insufficient documentation

## 2020-04-11 DIAGNOSIS — R109 Unspecified abdominal pain: Secondary | ICD-10-CM | POA: Insufficient documentation

## 2020-04-11 DIAGNOSIS — O26899 Other specified pregnancy related conditions, unspecified trimester: Secondary | ICD-10-CM

## 2020-04-11 DIAGNOSIS — N3000 Acute cystitis without hematuria: Secondary | ICD-10-CM | POA: Insufficient documentation

## 2020-04-11 DIAGNOSIS — Z79899 Other long term (current) drug therapy: Secondary | ICD-10-CM | POA: Insufficient documentation

## 2020-04-11 DIAGNOSIS — J45909 Unspecified asthma, uncomplicated: Secondary | ICD-10-CM | POA: Diagnosis not present

## 2020-04-11 DIAGNOSIS — O99511 Diseases of the respiratory system complicating pregnancy, first trimester: Secondary | ICD-10-CM | POA: Insufficient documentation

## 2020-04-11 DIAGNOSIS — O26891 Other specified pregnancy related conditions, first trimester: Secondary | ICD-10-CM | POA: Diagnosis not present

## 2020-04-11 DIAGNOSIS — Z3A08 8 weeks gestation of pregnancy: Secondary | ICD-10-CM | POA: Diagnosis not present

## 2020-04-11 LAB — WET PREP, GENITAL
Sperm: NONE SEEN
Trich, Wet Prep: NONE SEEN
Yeast Wet Prep HPF POC: NONE SEEN

## 2020-04-11 LAB — COMPREHENSIVE METABOLIC PANEL
ALT: 10 U/L (ref 0–44)
AST: 27 U/L (ref 15–41)
Albumin: 3.9 g/dL (ref 3.5–5.0)
Alkaline Phosphatase: 88 U/L (ref 38–126)
Anion gap: 8 (ref 5–15)
BUN: 9 mg/dL (ref 6–20)
CO2: 25 mmol/L (ref 22–32)
Calcium: 9 mg/dL (ref 8.9–10.3)
Chloride: 102 mmol/L (ref 98–111)
Creatinine, Ser: 0.85 mg/dL (ref 0.44–1.00)
GFR, Estimated: >60 mL/min (ref >60)
Glucose, Bld: 99 mg/dL (ref 70–99)
Potassium: 3.5 mmol/L (ref 3.5–5.1)
Sodium: 135 mmol/L (ref 135–145)
Total Bilirubin: 0.6 mg/dL (ref 0.3–1.2)
Total Protein: 7.4 g/dL (ref 6.5–8.1)

## 2020-04-11 LAB — CBC
HCT: 30.5 % — ABNORMAL LOW (ref 36.0–46.0)
Hemoglobin: 8.3 g/dL — ABNORMAL LOW (ref 12.0–15.0)
MCH: 15.8 pg — ABNORMAL LOW (ref 26.0–34.0)
MCHC: 27.2 g/dL — ABNORMAL LOW (ref 30.0–36.0)
MCV: 58.2 fL — ABNORMAL LOW (ref 80.0–100.0)
Platelets: 265 10*3/uL (ref 150–400)
RBC: 5.24 MIL/uL — ABNORMAL HIGH (ref 3.87–5.11)
RDW: 19.4 % — ABNORMAL HIGH (ref 11.5–15.5)
WBC: 7.4 10*3/uL (ref 4.0–10.5)
nRBC: 0 % (ref 0.0–0.2)

## 2020-04-11 LAB — HCG, QUANTITATIVE, PREGNANCY: hCG, Beta Chain, Quant, S: 154 m[IU]/mL — ABNORMAL HIGH (ref <5)

## 2020-04-11 LAB — POCT PREGNANCY, URINE: Preg Test, Ur: POSITIVE — AB

## 2020-04-11 LAB — URINALYSIS, ROUTINE W REFLEX MICROSCOPIC
Bilirubin Urine: NEGATIVE
Glucose, UA: NEGATIVE mg/dL
Hgb urine dipstick: NEGATIVE
Ketones, ur: NEGATIVE mg/dL
Nitrite: POSITIVE — AB
Protein, ur: NEGATIVE mg/dL
Specific Gravity, Urine: 1.015 (ref 1.005–1.030)
pH: 6 (ref 5.0–8.0)

## 2020-04-11 MED ORDER — NITROFURANTOIN MONOHYD MACRO 100 MG PO CAPS: 100.0000 mg | ORAL_CAPSULE | Freq: Two times a day (BID) | ORAL | 0 refills | Status: DC

## 2020-04-11 MED ORDER — SODIUM CHLORIDE 0.9 % IV SOLN: INTRAVENOUS | Status: DC

## 2020-04-11 MED ORDER — METHOTREXATE FOR ECTOPIC PREGNANCY
50.0000 mg/m2 | Freq: Once | INTRAMUSCULAR | Status: AC
  Administered 2020-04-11: 110 mg via INTRAMUSCULAR
  Filled 2020-04-11: qty 1

## 2020-04-11 MED ORDER — SODIUM CHLORIDE 0.9 % IV SOLN
510.0000 mg | Freq: Once | INTRAVENOUS | Status: AC
  Administered 2020-04-11: 510 mg via INTRAVENOUS
  Filled 2020-04-11: qty 17

## 2020-04-11 NOTE — Progress Notes (Signed)
Faculty Note  In to speak with patient with CNM. Pt presents to "make sure pregnancy is okay" and some lower abdominal cramping, very mild. Reports positive home pregnancy test before Thanksgiving. H/o ectopic treated with MTX.  HCG 154 and 1.6 cm suspicious mass in right adnexa. Nothing intra-uterine. H/H 8.3/30.5. Reviewed course with patient, suspicion that she has ectopic pregnancy, however, unable to confirm absolutely given low HCG. Reviewed images and they are suspicious for ectopic. Gave options for repeat HCG in 48 hours versus methotrexate now. Reviewed risks/benefits of each, risk of rupture of ectopic, risk of instability requiring surgery with ectopic rupture. Reviewed risk of giving methotrexate with very early normal pregnancy but that my suspicion is low based on imaging as well as the fact that she had positive pregnancy test at home > 8 days ago and HCG remains 154. Reviewed that given she is completely stable, prefer medical management versus surgery as surgery would likely increase her blood loss. She is agreeable to methotrexate. Also will give IV iron.    Baldemar Lenis, M.D. Attending Center for [employer] [facility])

## 2020-04-11 NOTE — MAU Note (Signed)
+  HPT (5) recently.all of them coming up positive.  Can't get appt with Center for [facility] until Jan 10.  Hx of prior unsuccessful pregnancies, so wanting to make sure everything is ok. Getting some sort of discomfort, pinching on both side, no bleeding.

## 2020-04-11 NOTE — Discharge Instructions (Signed)
Methotrexate Treatment for an Ectopic Pregnancy, Care After This sheet gives you information about how to care for yourself after your procedure. Your health care provider may also give you more specific instructions. If you have problems or questions, contact your health care provider. What can I expect after the procedure? After the procedure, it is common to have:  Abdominal cramping.  Vaginal bleeding.  Fatigue.  Nausea.  Vomiting.  Diarrhea. Blood tests will be taken at timed intervals for several days or weeks to check your pregnancy hormone levels. The blood tests will be done until the pregnancy hormone can no longer be detected in the blood. Follow these instructions at home: Activity  Do not have sex until your health care provider approves.  Limit activities that take a lot of effort as told by your health care provider. Medicines  Take over the counter and prescription medicines only as told by your health care provider.  Do not take aspirin, ibuprofen, naproxen, or any other NSAIDs.  Do not take folic acid, prenatal vitamins, or other vitamins that contain folic acid. General instructions   Do not drink alcohol.  Follow instructions from your health care provider on how and when to report any symptoms that may indicate a ruptured ectopic pregnancy.  Keep all follow-up visits as told by your health care provider. This is important. Contact a health care provider if:  You have persistent nausea and vomiting.  You have persistent diarrhea.  You are having a reaction to the medicine, such as: ? Tiredness. ? Skin rash. ? Hair loss. Get help right away if:  Your abdominal or pelvic pain gets worse.  You have more vaginal bleeding.  You feel light-headed or you faint.  You have shortness of breath.  Your heart rate increases.  You develop a cough.  You have chills.  You have a fever. Summary  After the procedure, it is common to have symptoms  of abdominal cramping, vaginal bleeding and fatigue. You may also experience other symptoms.  Blood tests will be taken at timed intervals for several days or weeks to check your pregnancy hormone levels. The blood tests will be done until the pregnancy hormone can no longer be detected in the blood.  Limit strenuous activity as told by your health care provider.  Follow instructions from your health care provider on how and when to report any symptoms that may indicate a ruptured ectopic pregnancy. This information is not intended to replace advice given to you by your health care provider. Make sure you discuss any questions you have with your health care provider. Document Revised: 04/08/2017 Document Reviewed: 06/15/2016 Elsevier Patient Education  2020 Elsevier Inc.   Urinary Tract Infection, Adult A urinary tract infection (UTI) is an infection of any part of the urinary tract. The urinary tract includes:  The kidneys.  The ureters.  The bladder.  The urethra. These organs make, store, and get rid of pee (urine) in the body. What are the causes? This is caused by germs (bacteria) in your genital area. These germs grow and cause swelling (inflammation) of your urinary tract. What increases the risk? You are more likely to develop this condition if:  You have a small, thin tube (catheter) to drain pee.  You cannot control when you pee or poop (incontinence).  You are female, and: ? You use these methods to prevent pregnancy:  A medicine that kills sperm (spermicide).  A device that blocks sperm (diaphragm). ? You have low levels  of a female hormone (estrogen). ? You are pregnant.  You have genes that add to your risk.  You are sexually active.  You take antibiotic medicines.  You have trouble peeing because of: ? A prostate that is bigger than normal, if you are female. ? A blockage in the part of your body that drains pee from the bladder (urethra). ? A kidney  stone. ? A nerve condition that affects your bladder (neurogenic bladder). ? Not getting enough to drink. ? Not peeing often enough.  You have other conditions, such as: ? Diabetes. ? A weak disease-fighting system (immune system). ? Sickle cell disease. ? Gout. ? Injury of the spine. What are the signs or symptoms? Symptoms of this condition include:  Needing to pee right away (urgently).  Peeing often.  Peeing small amounts often.  Pain or burning when peeing.  Blood in the pee.  Pee that smells bad or not like normal.  Trouble peeing.  Pee that is cloudy.  Fluid coming from the vagina, if you are female.  Pain in the belly or lower back. Other symptoms include:  Throwing up (vomiting).  No urge to eat.  Feeling mixed up (confused).  Being tired and grouchy (irritable).  A fever.  Watery poop (diarrhea). How is this treated? This condition may be treated with:  Antibiotic medicine.  Other medicines.  Drinking enough water. Follow these instructions at home:  Medicines  Take over-the-counter and prescription medicines only as told by your doctor.  If you were prescribed an antibiotic medicine, take it as told by your doctor. Do not stop taking it even if you start to feel better. General instructions  Make sure you: ? Pee until your bladder is empty. ? Do not hold pee for a long time. ? Empty your bladder after sex. ? Wipe from front to back after pooping if you are a female. Use each tissue one time when you wipe.  Drink enough fluid to keep your pee pale yellow.  Keep all follow-up visits as told by your doctor. This is important. Contact a doctor if:  You do not get better after 1-2 days.  Your symptoms go away and then come back. Get help right away if:  You have very bad back pain.  You have very bad pain in your lower belly.  You have a fever.  You are sick to your stomach (nauseous).  You are throwing up. Summary  A  urinary tract infection (UTI) is an infection of any part of the urinary tract.  This condition is caused by germs in your genital area.  There are many risk factors for a UTI. These include having a small, thin tube to drain pee and not being able to control when you pee or poop.  Treatment includes antibiotic medicines for germs.  Drink enough fluid to keep your pee pale yellow. This information is not intended to replace advice given to you by your health care provider. Make sure you discuss any questions you have with your health care provider. Document Revised: 04/13/2018 Document Reviewed: 11/03/2017 Elsevier Patient Education  2020 [employer].

## 2020-04-11 NOTE — MAU Provider Note (Signed)
History     CSN: [id number]  Arrival date and time: 04/11/20 1314   First Provider Initiated Contact with Patient 04/11/20 1511      Chief Complaint  Patient presents with  . Abdominal Pain   39 y.o. [id number] @[redacted]w[redacted]d  by sure LMP presenting with LAP. Pain started last night. Describes as intermittent, bilateral and cramping. Rates pain 2/10. Has not tried anything for it. Denies VB. Reports vaginal discharge and itching at times.    OB History    Gravida  4   Para  2   Term  2   Preterm  0   AB  1   Living  1     SAB  0   TAB  0   Ectopic  1   Multiple  0   Live Births              Past Medical History:  Diagnosis Date  . Asthma   . Hypertension     History reviewed. No pertinent surgical history.  Family History  Problem Relation Age of Onset  . Hypertension Mother   . Diabetes Father   . Diabetes Maternal Grandmother   . Hypertension Maternal Grandmother     Social History   Tobacco Use  . Smoking status: Former Smoker    Packs/day: 0.50    Years: 13.00    Pack years: 6.50    Types: Cigarettes    Start date: 07/24/1996    Quit date: 06/10/2012    Years since quitting: 7.8  . Smokeless tobacco: Never Used  Vaping Use  . Vaping Use: Never used  Substance Use Topics  . Alcohol use: No  . Drug use: No    Allergies:  Allergies  Allergen Reactions  . Omeprazole Hives and Shortness Of Breath  . Amlodipine     Jittery    Medications Prior to Admission  Medication Sig Dispense Refill Last Dose  . Prenatal Vit-Fe Fumarate-FA (MULTIVITAMIN-PRENATAL) 27-0.8 MG TABS tablet Take 1 tablet by mouth daily at 12 noon.   04/10/2020 at Unknown time  . albuterol (PROVENTIL HFA;VENTOLIN HFA) 108 (90 Base) MCG/ACT inhaler Inhale 2 puffs into the lungs every 4 (four) hours as needed for wheezing or shortness of breath. 1 Inhaler 0 More than a month at Unknown time  . metoprolol succinate (TOPROL-XL) 25 MG 24 hr tablet Take 1 tablet (25 mg total) by mouth  daily. 90 tablet 3   . triamcinolone cream (KENALOG) 0.1 % Apply 1 application topically 2 (two) times daily. 30 g 0     Review of Systems  Gastrointestinal: Positive for abdominal pain and nausea. Negative for vomiting.  Genitourinary: Positive for dysuria, frequency and vaginal discharge. Negative for hematuria, urgency and vaginal bleeding.   Physical Exam   Blood pressure 127/78, pulse 96, temperature 99 F (37.2 C), temperature source Oral, resp. rate 18, height 5\' 5"  (1.651 m), weight 102.9 kg, last menstrual period 02/13/2020, SpO2 100 %.  Physical Exam Vitals and nursing note reviewed. Exam conducted with a chaperone present.  Constitutional:      General: She is not in acute distress.    Appearance: Normal appearance.  HENT:     Head: Normocephalic and atraumatic.  Cardiovascular:     Rate and Rhythm: Normal rate.  Pulmonary:     Effort: Pulmonary effort is normal. No respiratory distress.  Abdominal:     General: There is no distension.     Palpations: Abdomen is soft. There is no mass.  Tenderness: There is no abdominal tenderness. There is no guarding or rebound.  Genitourinary:    Comments: External: no lesions or erythema Uterus: non enlarged, anteverted, non tender, no CMT Adnexae: no masses, no tenderness left, no tenderness right Cervix closed  Musculoskeletal:        General: Normal range of motion.     Cervical back: Normal range of motion.  Skin:    General: Skin is warm and dry.  Neurological:     General: No focal deficit present.     Mental Status: She is alert and oriented to person, place, and time.  Psychiatric:        Mood and Affect: Mood normal.        Behavior: Behavior normal.    Results for orders placed or performed during the hospital encounter of 04/11/20 (from the past 24 hour(s))  Urinalysis, Routine w reflex microscopic Urine, Clean Catch     Status: Abnormal   Collection Time: 04/11/20  2:13 PM  Result Value Ref Range    Color, Urine YELLOW YELLOW   APPearance CLOUDY (A) CLEAR   Specific Gravity, Urine 1.015 1.005 - 1.030   pH 6.0 5.0 - 8.0   Glucose, UA NEGATIVE NEGATIVE mg/dL   Hgb urine dipstick NEGATIVE NEGATIVE   Bilirubin Urine NEGATIVE NEGATIVE   Ketones, ur NEGATIVE NEGATIVE mg/dL   Protein, ur NEGATIVE NEGATIVE mg/dL   Nitrite POSITIVE (A) NEGATIVE   Leukocytes,Ua SMALL (A) NEGATIVE   RBC / HPF 0-5 0 - 5 RBC/hpf   WBC, UA 21-50 0 - 5 WBC/hpf   Bacteria, UA MANY (A) NONE SEEN   Squamous Epithelial / LPF 11-20 0 - 5   Mucus PRESENT   Pregnancy, urine POC     Status: Abnormal   Collection Time: 04/11/20  2:14 PM  Result Value Ref Range   Preg Test, Ur POSITIVE (A) NEGATIVE  Wet prep, genital     Status: Abnormal   Collection Time: 04/11/20  3:25 PM   Specimen: PATH Cytology Cervicovaginal Ancillary Only  Result Value Ref Range   Yeast Wet Prep HPF POC NONE SEEN NONE SEEN   Trich, Wet Prep NONE SEEN NONE SEEN   Clue Cells Wet Prep HPF POC PRESENT (A) NONE SEEN   WBC, Wet Prep HPF POC MANY (A) NONE SEEN   Sperm NONE SEEN   CBC     Status: Abnormal   Collection Time: 04/11/20  3:36 PM  Result Value Ref Range   WBC 7.4 4.0 - 10.5 K/uL   RBC 5.24 (H) 3.87 - 5.11 MIL/uL   Hemoglobin 8.3 (L) 12.0 - 15.0 g/dL   HCT 85.6 (L) 36 - 46 %   MCV 58.2 (L) 80.0 - 100.0 fL   MCH 15.8 (L) 26.0 - 34.0 pg   MCHC 27.2 (L) 30.0 - 36.0 g/dL   RDW 31.4 (H) 97.0 - 26.3 %   Platelets 265 150 - 400 K/uL   nRBC 0.0 0.0 - 0.2 %  hCG, quantitative, pregnancy     Status: Abnormal   Collection Time: 04/11/20  3:36 PM  Result Value Ref Range   hCG, Beta Chain, Quant, S 154 (H) <5 mIU/mL  Comprehensive metabolic panel     Status: None   Collection Time: 04/11/20  3:53 PM  Result Value Ref Range   Sodium 135 135 - 145 mmol/L   Potassium 3.5 3.5 - 5.1 mmol/L   Chloride 102 98 - 111 mmol/L   CO2 25 22 - 32  mmol/L   Glucose, Bld 99 70 - 99 mg/dL   BUN 9 6 - 20 mg/dL   Creatinine, Ser 5.02 0.44 - 1.00 mg/dL    Calcium 9.0 8.9 - 77.4 mg/dL   Total Protein 7.4 6.5 - 8.1 g/dL   Albumin 3.9 3.5 - 5.0 g/dL   AST 27 15 - 41 U/L   ALT 10 0 - 44 U/L   Alkaline Phosphatase 88 38 - 126 U/L   Total Bilirubin 0.6 0.3 - 1.2 mg/dL   GFR, Estimated >12 >87 mL/min   Anion gap 8 5 - 15   US OB LESS THAN 14 WEEKS WITH OB TRANSVAGINAL  Result Date: 04/11/2020 CLINICAL DATA:  Right lower quadrant pain with positive pregnancy test. EXAM: OBSTETRIC <14 WK [place] AND TRANSVAGINAL OB US TECHNIQUE: Both transabdominal and transvaginal ultrasound examinations were performed for complete evaluation of the gestation as well as the maternal uterus, adnexal regions, and pelvic cul-de-sac. Transvaginal technique was performed to assess early pregnancy. COMPARISON:  None. FINDINGS: Intrauterine gestational sac: No intrauterine gestational sac visualized. Subchorionic hemorrhage:  N/A Maternal uterus/adnexae: Left ovary unremarkable. Small septated follicle noted in the right ovary. Adjacent to the right ovary is a 16 mm thick walled cystic lesion with peripheral flow signal on color Doppler imaging. Sonographer reports tenderness in the right adnexal space on endovaginal scanning. No substantial intraperitoneal free fluid. IMPRESSION: 1. No intrauterine gestational sac identified. 2. 16 mm thick walled cystic lesion adjacent to the right ovary is highly suspicious for ectopic pregnancy. Soccer for reports tenderness in the right adnexal region on endovaginal scanning. No evidence for hemoperitoneum at this time. Critical Value/emergent results were called by telephone at the time of interpretation on 04/11/2020 at 5:32 pm to provider [facility] , who verbally acknowledged these results. Electronically Signed   By: Kennith Center M.D.   On: 04/11/2020 17:33   MAU Course  Procedures MTX Feraheme  MDM Labs and [place] ordered and reviewed. Consult with Dr. Earlene Plater for suspected ectopic pregnancy. Dr. Earlene Plater in to counsel pt. Pt prefers MTX for  treatment. The risks of methotrexate were reviewed including failure requiring repeat dosing or eventual surgery. She understands that methotrexate involves frequent return visits to monitor lab values and that she remains at risk of ectopic rupture until her beta is less than assay. The patient opts to proceed with methotrexate. She has no history of hepatic or renal dysfunction, has normal BUN/Cr/LFT's/platelets. She is felt to be reliable for follow-up. Side effects of photosensitivity & GI upset were discussed. She knows to avoid direct sunlight and abstain from alcohol, aspirin and aspirin-like products for two weeks. She was counseled to discontinue any MVI with folic acid. She understands to follow up on D4 (04/14/20) and D7 (04/17/20) for repeat BHCG and was given the instruction sheet. Strict ectopic precautions were reviewed, the patient knows to call with any abdominal pain, vomiting, fainting, or any concerns with her health. Rh pos. Will treat for UTI, UC sent. Continue po Fe. Stable for discharge home.   Assessment and Plan   1. Right tubal pregnancy without intrauterine pregnancy   2. Abdominal pain affecting pregnancy   3. Iron deficiency anemia, unspecified iron deficiency anemia type   4. Acute cystitis without hematuria    Discharge home [facility] on 12/6 @0900  Strict return precautions Rx Macrobid  Allergies as of 04/11/2020      Reactions   Omeprazole Hives, Shortness Of Breath   Amlodipine  Jittery      Medication List    STOP taking these medications   multivitamin-prenatal 27-0.8 MG Tabs tablet     TAKE these medications   albuterol 108 (90 Base) MCG/ACT inhaler Commonly known as: VENTOLIN HFA Inhale 2 puffs into the lungs every 4 (four) hours as needed for wheezing or shortness of breath.   metoprolol succinate 25 MG 24 hr tablet Commonly known as: TOPROL-XL Take 1 tablet (25 mg total) by mouth daily.   nitrofurantoin (macrocrystal-monohydrate) 100  MG capsule Commonly known as: MACROBID Take 1 capsule (100 mg total) by mouth 2 (two) times daily.   triamcinolone 0.1 % Commonly known as: KENALOG Apply 1 application topically 2 (two) times daily.       Donette LarryMelanie Teretha Chalupa, CNM 04/11/2020, 8:58 PM

## 2020-04-14 ENCOUNTER — Ambulatory Visit (INDEPENDENT_AMBULATORY_CARE_PROVIDER_SITE_OTHER): Payer: Medicaid Other | Admitting: General Practice

## 2020-04-14 ENCOUNTER — Other Ambulatory Visit: Payer: Self-pay | Admitting: *Deleted

## 2020-04-14 ENCOUNTER — Other Ambulatory Visit: Payer: Self-pay

## 2020-04-14 VITALS — BP 128/61 | HR 89 | Ht 65.0 in | Wt 226.0 lb

## 2020-04-14 DIAGNOSIS — O00101 Right tubal pregnancy without intrauterine pregnancy: Secondary | ICD-10-CM

## 2020-04-14 LAB — GC/CHLAMYDIA PROBE AMP (~~LOC~~) NOT AT ARMC
Chlamydia: NEGATIVE
Comment: NEGATIVE
Comment: NORMAL
Neisseria Gonorrhea: NEGATIVE

## 2020-04-14 LAB — BETA HCG QUANT (REF LAB): hCG Quant: 79 m[IU]/mL

## 2020-04-14 MED ORDER — METOPROLOL SUCCINATE ER 25 MG PO TB24
25.0000 mg | ORAL_TABLET | Freq: Every day | ORAL | 3 refills | Status: DC
Start: 2020-04-14 — End: 2021-05-25

## 2020-04-14 NOTE — Progress Notes (Signed)
Patient presents to office today for stat bhcg #4 labs following MTX on 12/3. Patient states pain has stayed about the same since then and denies bleeding. Discussed with patient we are monitoring your bhcg levels today, results take approximately 2 hours to finalize and will be reviewed with a provider, we will then call you with results and updated plan of care. Patient verbalized understanding.   Reviewed results with Dr Vergie Living who finds appropriate decreasing bhcg levels, patient should follow up on Thursday for day #7 labs.  Called patient and informed her of results. Patient verbalized understanding and asked about work & food restrictions. 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 Discussed with patient she shouldn't lift greater than 20lbs, avoid abdominal exercises, avoid tylenol/motrin, and avoid multivitamins per Dr Vergie Living. Patient verbalized understanding and asked about her iron levels. Told patient we did not check those today and wouldn't on Thursday either. Told patient the provider she sees for follow up may decide to recheck but that would be up to them. Patient verbalized understanding and will follow up on Thursday for day #7 labs.   Chase Caller RN BSN 04/14/20

## 2020-04-15 LAB — CULTURE, OB URINE: Culture: >=100000 — AB

## 2020-04-17 ENCOUNTER — Ambulatory Visit (INDEPENDENT_AMBULATORY_CARE_PROVIDER_SITE_OTHER): Payer: Medicaid Other | Admitting: *Deleted

## 2020-04-17 ENCOUNTER — Encounter: Payer: Self-pay | Admitting: *Deleted

## 2020-04-17 ENCOUNTER — Other Ambulatory Visit: Payer: Self-pay

## 2020-04-17 VITALS — BP 145/82 | HR 93 | Ht 65.0 in | Wt 230.2 lb

## 2020-04-17 DIAGNOSIS — O00101 Right tubal pregnancy without intrauterine pregnancy: Secondary | ICD-10-CM | POA: Diagnosis not present

## 2020-04-17 LAB — BETA HCG QUANT (REF LAB): hCG Quant: 42 m[IU]/mL

## 2020-04-17 NOTE — Progress Notes (Signed)
Pt presents for stat BHCG on Sharlett Lienemann #7 following MTX. She reports intermittent cramping which was severe @ times yesterday. She rested and did not go to the hospital.  She has no pain today. She denies vaginal bleeding. Elevated BP was identified today. Pt states that she has not yet taken Metoprolol today and only took once yesterday. She was encouraged to take her medication as prescribed. Pt was advised that she will be called with test results and plan of care instructions later today. She stated that a detailed message can be left on voicemail if she does not answer. Pt was again reminded that if she develops severe pain, she should go to MAU for evaluation.   1155 Stat BHCG results (42) reviewed with Dr. Donavan Foil who finds appropriate decrease in hormone level. Pt will need non-stat BHCG in one week . Pt was called and informed of test result as well as plan of care. She will receive her next appt info via Mychart. She voiced understanding and had no questions.

## 2020-04-21 NOTE — Progress Notes (Signed)
Patient was assessed and managed by nursing staff during this encounter. I have reviewed the chart and agree with the documentation and plan. I have also made any necessary editorial changes.  Warden Fillers, MD 04/21/2020 9:02 AM

## 2020-04-23 NOTE — Progress Notes (Signed)
Patient was assessed and managed by nursing staff during this encounter. I have reviewed the chart and agree with the documentation and plan. I have also made any necessary editorial changes.  Alazar Cherian, MD 04/23/2020 11:25 AM   

## 2020-04-24 ENCOUNTER — Other Ambulatory Visit: Payer: Self-pay

## 2020-04-24 ENCOUNTER — Other Ambulatory Visit: Payer: Medicaid Other

## 2020-04-24 DIAGNOSIS — O00101 Right tubal pregnancy without intrauterine pregnancy: Secondary | ICD-10-CM

## 2020-04-25 ENCOUNTER — Telehealth: Payer: Self-pay | Admitting: *Deleted

## 2020-04-25 LAB — BETA HCG QUANT (REF LAB): hCG Quant: 6 m[IU]/mL

## 2020-04-25 NOTE — Telephone Encounter (Addendum)
-----   Message from Warden Fillers, MD sent at 04/25/2020  8:39 AM EST ----- Continued decrease in bhcg, repeat in 1-2 weeks  12/17  0929  Called pt and she did not answer - unable to leave message. Per chart review, pt does not check Mychart. She will be scheduled for lab only appt in 1-2 weeks - message sent to scheduling staff. Clinical staff will continue to attempt to reach pt with this information.

## 2020-04-30 NOTE — Telephone Encounter (Signed)
Called patient & informed her of results. Patient states she is aware and saw the results on mychart. Patient will follow up on 12/30 for office visit. Patient had no questions.

## 2020-05-07 ENCOUNTER — Other Ambulatory Visit: Payer: Medicaid Other

## 2020-05-08 ENCOUNTER — Other Ambulatory Visit: Payer: Medicaid Other

## 2020-05-08 ENCOUNTER — Encounter: Payer: Self-pay | Admitting: Family Medicine

## 2020-05-08 ENCOUNTER — Ambulatory Visit (INDEPENDENT_AMBULATORY_CARE_PROVIDER_SITE_OTHER): Payer: Medicaid Other | Admitting: Family Medicine

## 2020-05-08 ENCOUNTER — Other Ambulatory Visit: Payer: Self-pay

## 2020-05-08 VITALS — Wt 229.0 lb

## 2020-05-08 DIAGNOSIS — O00109 Unspecified tubal pregnancy without intrauterine pregnancy: Secondary | ICD-10-CM

## 2020-05-08 DIAGNOSIS — O039 Complete or unspecified spontaneous abortion without complication: Secondary | ICD-10-CM

## 2020-05-08 NOTE — Progress Notes (Signed)
   Subjective:    Patient ID: Tonya Owens, female    DOB: [birth year], 39 y.o.   MRN: [id number]  HPI Patient is seen for s/p MTX for ectopic pregnancy. This is her second ectopic pregnancy treated with MTX. No pain. Did have period about a week ago.    Review of Systems     Objective:   Physical Exam Vitals reviewed.  Constitutional:      Appearance: Normal appearance.  Abdominal:     General: Abdomen is flat. There is no distension.     Palpations: Abdomen is soft. There is no mass.     Tenderness: There is no abdominal tenderness.     Hernia: No hernia is present.  Neurological:     Mental Status: She is alert.  Psychiatric:        Mood and Affect: Mood normal.        Behavior: Behavior normal.        Thought Content: Thought content normal.        Judgment: Judgment normal.        Assessment & Plan:  1. Tubal pregnancy without intrauterine pregnancy, unspecified laterality Discussed contraception. Patient undecided. Discussed options. Recheck HCG today.

## 2020-05-09 LAB — BETA HCG QUANT (REF LAB): hCG Quant: <1 m[IU]/mL

## 2020-05-19 ENCOUNTER — Ambulatory Visit: Payer: Medicaid Other | Admitting: Women's Health

## 2020-05-29 ENCOUNTER — Ambulatory Visit: Payer: Medicaid Other | Admitting: Student

## 2020-08-28 ENCOUNTER — Ambulatory Visit: Payer: Self-pay

## 2020-09-02 ENCOUNTER — Telehealth: Payer: Self-pay | Admitting: *Deleted

## 2020-09-02 ENCOUNTER — Other Ambulatory Visit: Payer: Self-pay

## 2020-09-02 ENCOUNTER — Ambulatory Visit (INDEPENDENT_AMBULATORY_CARE_PROVIDER_SITE_OTHER): Payer: Medicaid Other | Admitting: Family Medicine

## 2020-09-02 VITALS — BP 144/83 | HR 96 | Temp 98.9°F | Ht 65.0 in | Wt 226.0 lb

## 2020-09-02 DIAGNOSIS — F331 Major depressive disorder, recurrent, moderate: Secondary | ICD-10-CM | POA: Diagnosis not present

## 2020-09-02 DIAGNOSIS — F339 Major depressive disorder, recurrent, unspecified: Secondary | ICD-10-CM

## 2020-09-02 DIAGNOSIS — J302 Other seasonal allergic rhinitis: Secondary | ICD-10-CM | POA: Diagnosis not present

## 2020-09-02 DIAGNOSIS — R399 Unspecified symptoms and signs involving the genitourinary system: Secondary | ICD-10-CM | POA: Diagnosis not present

## 2020-09-02 DIAGNOSIS — R319 Hematuria, unspecified: Secondary | ICD-10-CM

## 2020-09-02 LAB — POCT URINALYSIS DIP (CLINITEK)
Bilirubin, UA: NEGATIVE
Glucose, UA: NEGATIVE mg/dL
Ketones, POC UA: NEGATIVE mg/dL
Leukocytes, UA: NEGATIVE
Nitrite, UA: NEGATIVE
POC PROTEIN,UA: 30 — AB
Spec Grav, UA: >=1.03 — AB (ref 1.010–1.025)
Urobilinogen, UA: 1 E.U./dL
pH, UA: 5.5 (ref 5.0–8.0)

## 2020-09-02 LAB — POCT URINE PREGNANCY: Preg Test, Ur: NEGATIVE

## 2020-09-02 LAB — POCT UA - MICROSCOPIC ONLY

## 2020-09-02 NOTE — Progress Notes (Deleted)
   Subjective:   Patient ID: Tonya Owens    DOB: [birth year], 40 y.o. female   MRN: [id number]  Tonya Owens is a 40 y.o. 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 She declined medications at that time and desired counseling. Today she notes ** Protective factors:  PHQ-9 score *** Denies SI/HI  Review of Systems:  Per HPI.   Objective:   LMP 09/01/2020  Vitals and nursing note reviewed.  General: pleasant ***, sitting comfortably in exam chair, well nourished, well developed, in no acute distress with non-toxic appearance HEENT: normocephalic, atraumatic, moist mucous membranes, oropharynx clear without erythema or exudate, TM normal bilaterally  Neck: supple, non-tender without lymphadenopathy CV: regular rate and rhythm without murmurs, rubs, or gallops, no lower extremity edema, 2+ radial and pedal pulses bilaterally Lungs: clear to auscultation bilaterally with normal work of breathing on room air Resp: breathing comfortably on room air, speaking in full sentences Abdomen: soft, non-tender, non-distended, no masses or organomegaly palpable, normoactive bowel sounds Skin: warm, dry, no rashes or lesions Extremities: warm and well perfused, normal tone MSK: ROM grossly intact, strength intact, gait normal Neuro: Alert and oriented, speech normal  Assessment & Plan:   No problem-specific Assessment & Plan notes found for this encounter.  No orders of the defined types were placed in this encounter.  No orders of the defined types were placed in this encounter.   {    This will disappear when note is signed, click to select method of visit    :1}  Tonya Cobb, DO PGY-3, [facility] 09/02/2020 5:38 PM

## 2020-09-02 NOTE — Progress Notes (Addendum)
    SUBJECTIVE:   CHIEF COMPLAINT / HPI: worried about urinary infection, wanting allergy medications  Urinary Symptoms Patient reports having seen blood in her urine 1 week ago.  This was after her menses had just finished.  She has since started another period so unable to tell if this is from menses or in the urine.  Denies any urinary urgency, frequency, dysuria, back pain, abdominal pain, nausea or vomiting, fevers or chills.    Allergies Has had worsening allergy symptoms for about 2 weeks.  Dry non productive cough, runny nose and itching.  She does not want to take Zyrtec because it makes her drowsy. Has not been using Flonase nasal spray.  Denies any shortness of breath, chest pain, fevers or chills. No recent sick contacts.    Depression PHQ 9 reviewed and positive for question #9. She has no thoughts of self harm or harm to other today.  She has thought of harming herself in the past two weeks.  Patient reports has thought of jumping of bridge to end life if she were to do have active plan,  She reports she has no plan to do this as she has a daughter who she would miss and she is very proud of that her daughter is going to college and doing well for herself.  She knows that is she were to act on her plan then she would never be able to see her daughter again and she doesn't want this to happen.  She reports never having seen a therapist or taking medications.  She also endorses being homeless and is having financial difficulties.  She does have a job right now.   PERTINENT  PMH / PSH:  Depression HTN  OBJECTIVE:   BP (!) 144/83   Pulse 96   Temp 98.9 F (37.2 C) (Oral)   Ht 5\' 5"  (1.651 m)   Wt 226 lb (102.5 kg)   LMP 09/01/2020   SpO2 98%   BMI 37.61 kg/m    General: Alert, no acute distress Cardio: Normal S1 and S2, RRR, no r/m/g Pulm: CTAB, normal work of breathing Abdomen: Bowel sounds normal. Abdomen soft and non-tender.  Psych: engages in conversation, makes  appropriate eye contact, speech is normal.  No visual/auditory/tactile hallucinations.  No SI/HI today    ASSESSMENT/PLAN:   Painless hematuria No UTI symptoms.  Patient currently on menses.  Urine negative for UTI.  -Follow up if continues to have blood in urine when off menstrual cycle -UPT negative -Continue to hydrate -Follow up with PCP as needed  Seasonal allergies Allegra 60 mg BID Restart Flonase daily  Depression MDQ screening negative. PHQ 9 positive for SI.  No active thoughts or plan today.  Daughter is protective factor.  Discussed medications and therapy.  Patient would like to start therapy.  Will consider medications.   -Offered medication, patient declined today -CCM referral to help with financial/housing and mental health resources -Mental health resources provided -24 hr Crisis line provided -Follow up appointment scheduled for Fri with Dr Mon at 350 pm -Will call tomorrow to check in with patient -Strict return precautions provided     Mauri Reading, MD [facility]

## 2020-09-02 NOTE — Chronic Care Management (AMB) (Signed)
  Care Management   Outreach Note  09/02/2020 Name: Tonya Owens MRN: [id number] DOB: Jul 24, 1980  Referred by: Westley Chandler, MD Reason for referral : Care Coordination (Initial outreach to schedule referral with Licensed Clinical Social Worker)   An unsuccessful telephone outreach was attempted today. The patient was referred to the case management team for assistance with care management and care coordination.   Follow Up Plan: The care management team will reach out to the patient again over the next 1 days.  If patient returns call to provider office, please advise to call Embedded Care Management Care Guide Gwenevere Ghazi at [phone].  Gwenevere Ghazi  Care Guide, Embedded Care Coordination [facility] Management

## 2020-09-02 NOTE — Patient Instructions (Addendum)
Thank you for coming to see me today. It was a pleasure.   Your urine does not show any infection. Likely the blood in your urine was from your recent period.  If you see any blood in your urine while you are not on your period please let us know.  I would encourage you to consider starting a medication to help with your depression.    Please follow-up scheduled for Friday April 29  Your PAP smear is due in 2024.    If you have any questions or concerns, please do not hesitate to call the office at [phone].  Best,   Dana Allan, MD   Outpatient Mental Health Providers (No Insurance required or Self Pay)  [facility]  [phone] [email or web address] [facility] and [facility]  [address] [place], [place] [phone] Crisis [phone]  [facility] [place] [facility]) can see uninsured folks for outpatient therapy [email or web address] [address] [place], [place] [address] [phone]  [facility]    Walk-in Mon-Fri, 8am-3pm [email or web address] Scull [address] [place], [place]  [phone]   [address]  [place] [phone] [facility] for psych med management, there may be a wait- if [facility] is working with clients for OPT, they will coordinate with [facility] for psych  [facility]   Walk-in-Clinic: Monday- Friday 9:00 AM - 4:00 PM [address]   [place], [place] [phone]  [facility] [place] ([employer]) walk in M-F 8am-12pm and  1pm-3pm [place]- [address]     [phone]  [employer] -[address]  Phone: [phone]  [employer] (Mental Health and substance challenges) [address]   [place] [phone]    [email or web address]    [facility]  [place] -[address] [place], [place]     Phone:   [phone] [place]-  [address]  [phone]   [facility]  [address] [place]  [phone] [email or web address]   [facility] ( virtual or zoom therapy) [email or web address]  [address]  [phone]    [facility] [phone]  grief counseling, dementia and caregiver support    [facility] Walk-in MWF 12:30 to 3:00     [address] [place] [address]  [phone]  [email or web address] call to schedule an appointment    Mental Health [facility] Classes ,Support group, Peer support services, [address] [place], [place] [address] [phone]  [email or web address]           National Alliance on Mental Illness (NAMI) [place]- Wellness classes, Support groups        [address] [place], [place] [address] [phone]   [email or web address]   [facility]  (Psycho-social Rehabilitation clubhouse, Individual and group therapy) [address] [place], [place] [address]   [phone]  24- Hour Availability:  [facility] [phone] or [phone] * [facility] [employer] (Domestic Violence, Rape, etc. )[phone] V[place] Mixer [phone] or [phone] * [facility] [place] [facility] [phone] only) [phone] (after hours) *[facility] Mobile Crisis Unit [phone] *[place] National Suicide Hotline [phone] Len Childs)

## 2020-09-03 ENCOUNTER — Encounter: Payer: Self-pay | Admitting: Family Medicine

## 2020-09-03 ENCOUNTER — Telehealth: Payer: Self-pay | Admitting: *Deleted

## 2020-09-03 DIAGNOSIS — J302 Other seasonal allergic rhinitis: Secondary | ICD-10-CM | POA: Insufficient documentation

## 2020-09-03 DIAGNOSIS — R319 Hematuria, unspecified: Secondary | ICD-10-CM | POA: Insufficient documentation

## 2020-09-03 MED ORDER — FLUTICASONE PROPIONATE 50 MCG/ACT NA SUSP: 2.0000 | Freq: Every day | NASAL | 2 refills | Status: AC

## 2020-09-03 MED ORDER — FEXOFENADINE HCL 60 MG PO TABS: 60.0000 mg | ORAL_TABLET | Freq: Two times a day (BID) | ORAL | 0 refills | Status: AC

## 2020-09-03 MED ORDER — FLUTICASONE PROPIONATE 50 MCG/ACT NA SUSP: 2.0000 | Freq: Every day | NASAL | 6 refills | Status: DC

## 2020-09-03 NOTE — Chronic Care Management (AMB) (Signed)
  Care Management   Outreach Note  09/03/2020 Name: Tonya Owens MRN: [id number] DOB: [birth year]  Referred by: Westley Chandler, MD Reason for referral : Care Coordination (Second outreach to schedule Urgent Referral with Licensed Clinical Social Worker was unsuccessful)   A second unsuccessful telephone outreach was attempted today. The patient was referred to the case management team for assistance with care management and care coordination.   Follow Up Plan: The care management team will reach out to the patient again over the next 2 days.  If patient returns call to provider office, please advise to call Embedded Care Management Care Guide Gwenevere Ghazi at [phone].  Gwenevere Ghazi  Care Guide, Embedded Care Coordination [facility] Management  Direct Dial: [phone]

## 2020-09-03 NOTE — Assessment & Plan Note (Signed)
Allegra 60 mg BID Restart Flonase daily

## 2020-09-03 NOTE — Assessment & Plan Note (Addendum)
No UTI symptoms.  Patient currently on menses.  Urine negative for UTI.  -Follow up if continues to have blood in urine when off menstrual cycle -UPT negative -Continue to hydrate -Follow up with PCP as needed

## 2020-09-03 NOTE — Assessment & Plan Note (Addendum)
MDQ screening negative. PHQ 9 positive for SI.  No active thoughts or plan today.  Daughter is protective factor.  Discussed medications and therapy.  Patient would like to start therapy.  Will consider medications.   -Offered medication, patient declined today -CCM referral to help with financial/housing and mental health resources -Mental health resources provided -24 hr Crisis line provided -Follow up appointment scheduled for Fri with Dr Mauri Reading at 350 pm -Will call tomorrow to check in with patient -Strict return precautions provided

## 2020-09-04 ENCOUNTER — Telehealth: Payer: Self-pay | Admitting: *Deleted

## 2020-09-04 NOTE — Chronic Care Management (AMB) (Signed)
  Care Management   Outreach Note  09/04/2020 Name: Tonya Owens MRN: [id number] DOB: 25-Jun-1980  Referred by: Westley Chandler, MD Reason for referral : Care Coordination (Third Outreach to schedule referral with Licensed Clinical SW  was unsuccessful)   Third unsuccessful telephone outreach was attempted today. The patient was referred to the case management team for assistance with care management and care coordination. The patient's primary care provider has been notified of our unsuccessful attempts to make or maintain contact with the patient. The care management team is pleased to engage with this patient at any time in the future should he/she be interested in assistance from the care management team.   Follow Up Plan: We have been unable to make contact with the patient . The care management team is available to follow up with the patient after provider conversation with the patient regarding recommendation for care management engagement and subsequent re-referral to the care management team. The care management team has outreached to patient X3 will message PCP, Dr. Manson Passey.  If patient returns call to provider office, please advise to call Embedded Care Management Care Guide Gwenevere Ghazi at [phone].  Gwenevere Ghazi  Care Guide, Embedded Care Coordination [facility] Management

## 2020-09-05 ENCOUNTER — Ambulatory Visit: Payer: Medicaid Other | Admitting: Family Medicine

## 2020-09-07 NOTE — Progress Notes (Deleted)
   Subjective:   Patient ID: Tonya Owens    DOB: [birth year], 40 y.o. female   MRN: [id number]  Tonya Owens is a 40 y.o. 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  She had no active thoughts of SI or plan.  Protective factors include her daughter.  Patient declined medications.  She was referred to therapy.  She was also referred to CCM for financial/housing and mental health resources.  She returns today for close follow-up and notes***  Review of Systems:  Per HPI.   Objective:   LMP 09/01/2020  Vitals and nursing note reviewed.  General: pleasant ***, sitting comfortably in exam chair, well nourished, well developed, in no acute distress with non-toxic appearance HEENT: normocephalic, atraumatic, moist mucous membranes, oropharynx clear without erythema or exudate, TM normal bilaterally  Neck: supple, non-tender without lymphadenopathy CV: regular rate and rhythm without murmurs, rubs, or gallops, no lower extremity edema, 2+ radial and pedal pulses bilaterally Lungs: clear to auscultation bilaterally with normal work of breathing on room air Resp: breathing comfortably on room air, speaking in full sentences Abdomen: soft, non-tender, non-distended, no masses or organomegaly palpable, normoactive bowel sounds Skin: warm, dry, no rashes or lesions Extremities: warm and well perfused, normal tone MSK: ROM grossly intact, strength intact, gait normal Neuro: Alert and oriented, speech normal  Depression screen [facility] 2/9 09/02/2020 09/17/2019 09/15/2018  Decreased Interest 1 2 -  Down, Depressed, Hopeless 1 2 1   PHQ - 2 Score 2 4 1   Altered sleeping 1 3 -  Tired, decreased energy 1 3 -  Change in appetite 0 0 -  Feeling bad  or failure about yourself  1 2 -  Trouble concentrating 1 0 -  Moving slowly or fidgety/restless 0 0 -  Suicidal thoughts 1 2 -  PHQ-9 Score 7 14 -  Difficult doing work/chores - Somewhat difficult -    Assessment & Plan:   No problem-specific Assessment & Plan notes found for this encounter.  No orders of the defined types were placed in this encounter.  No orders of the defined types were placed in this encounter.   {    This will disappear when note is signed, click to select method of visit    :1}  , DO PGY-3, [facility] 09/07/2020 11:05 AM

## 2020-09-08 ENCOUNTER — Ambulatory Visit: Payer: Medicaid Other | Admitting: Family Medicine

## 2021-04-09 ENCOUNTER — Encounter: Payer: Self-pay | Admitting: Obstetrics & Gynecology

## 2021-04-09 ENCOUNTER — Ambulatory Visit: Payer: Medicaid Other | Admitting: Obstetrics & Gynecology

## 2021-04-09 ENCOUNTER — Other Ambulatory Visit: Payer: Self-pay

## 2021-04-09 VITALS — BP 148/85 | HR 94 | Wt 229.0 lb

## 2021-04-09 DIAGNOSIS — Z1231 Encounter for screening mammogram for malignant neoplasm of breast: Secondary | ICD-10-CM | POA: Diagnosis not present

## 2021-04-09 DIAGNOSIS — Z3042 Encounter for surveillance of injectable contraceptive: Secondary | ICD-10-CM

## 2021-04-09 DIAGNOSIS — F419 Anxiety disorder, unspecified: Secondary | ICD-10-CM

## 2021-04-09 DIAGNOSIS — Z3202 Encounter for pregnancy test, result negative: Secondary | ICD-10-CM

## 2021-04-09 LAB — POCT PREGNANCY, URINE: Preg Test, Ur: NEGATIVE

## 2021-04-09 MED ORDER — MEDROXYPROGESTERONE ACETATE 150 MG/ML IM SUSP
150.0000 mg | Freq: Once | INTRAMUSCULAR | Status: AC
  Administered 2021-04-09: 150 mg via INTRAMUSCULAR

## 2021-04-09 NOTE — Progress Notes (Signed)
   GYNECOLOGY OFFICE VISIT NOTE  History:   Tonya Owens is a 40 y.o. [phone] here today for discussion about Depo Provera re-initiation. She has used this in the past.  She denies any abnormal vaginal discharge, bleeding, pelvic pain or other concerns.    Past Medical History:  Diagnosis Date   Asthma    Hypertension     History reviewed. No pertinent surgical history.  The following portions of the patient's history were reviewed and updated as appropriate: allergies, current medications, past family history, past medical history, past social history, past surgical history and problem list.   Health Maintenance:  Normal pap and negative HRHPV on 07/30/2019.   Review of Systems:  Pertinent items noted in HPI and remainder of comprehensive ROS otherwise negative.  Physical Exam:  BP (!) 148/85   Pulse 94   Wt 229 lb (103.9 kg)   LMP 04/01/2021 (Approximate)   Breastfeeding No   BMI 38.11 kg/m  CONSTITUTIONAL: Well-developed, well-nourished female in no acute distress.  HEENT:  Normocephalic, atraumatic. External right and left ear normal. No scleral icterus.  NECK: Normal range of motion, supple, no masses noted on observation SKIN: No rash noted. Not diaphoretic. No erythema. No pallor. MUSCULOSKELETAL: Normal range of motion. No edema noted. NEUROLOGIC: Alert and oriented to person, place, and time. Normal muscle tone coordination. No cranial nerve deficit noted. PSYCHIATRIC: Normal mood and affect. Normal behavior. Normal judgment and thought content. CARDIOVASCULAR: Normal heart rate noted RESPIRATORY: Effort and breath sounds normal, no problems with respiration noted ABDOMEN: No masses noted. No other overt distention noted.   PELVIC: Deferred     Assessment and Plan:    1. Anxiety Patient consented to [facility] services about presenting concerns and psychiatric consultation as appropriate. - Ambulatory referral to Integrated Behavioral  Health  2. Breast cancer screening by mammogram Mammogram scheduled - MM Digital Screening; Future  3. Encounter for Depo-Provera contraception - medroxyPROGESTERone (DEPO-PROVERA) injection 150 mg She has considered IUDs, Nexplanon. Wants Depo Provera for now.  Will give today, and return every three months.  Routine preventative health maintenance measures emphasized, up to date on pap smear. Please refer to After Visit Summary for other counseling recommendations.   Return in 3 months (on 07/08/2021) for Depo Provera injection.    I spent 15 minutes dedicated to the care of this patient including pre-visit review of records, face to face time with the patient discussing her conditions and treatments and post visit orders.    Jaynie Collins, MD, FACOG Obstetrician & Gynecologist, [facility] for [employer], [facility]

## 2021-04-16 ENCOUNTER — Telehealth: Payer: Self-pay | Admitting: Clinical

## 2021-04-16 NOTE — Telephone Encounter (Signed)
Attempt to schedule, per referral; Left HIPPA-compliant message to call back Parris Signer from [facility] at  [phone] (Alamin Mccuiston's office); left MyChart message for pt. °

## 2021-05-25 ENCOUNTER — Other Ambulatory Visit: Payer: Self-pay

## 2021-05-25 MED ORDER — METOPROLOL SUCCINATE ER 25 MG PO TB24: 25.0000 mg | ORAL_TABLET | Freq: Every day | ORAL | 0 refills | Status: DC

## 2021-06-09 ENCOUNTER — Ambulatory Visit
Admission: EM | Admit: 2021-06-09 | Discharge: 2021-06-09 | Disposition: A | Discharge status: Home / Self Care | Payer: Medicaid Other | Attending: Urgent Care | Admitting: Urgent Care

## 2021-06-09 ENCOUNTER — Encounter: Payer: Self-pay | Admitting: Emergency Medicine

## 2021-06-09 ENCOUNTER — Other Ambulatory Visit: Payer: Self-pay

## 2021-06-09 DIAGNOSIS — L729 Follicular cyst of the skin and subcutaneous tissue, unspecified: Secondary | ICD-10-CM | POA: Diagnosis not present

## 2021-06-09 DIAGNOSIS — L089 Local infection of the skin and subcutaneous tissue, unspecified: Secondary | ICD-10-CM | POA: Diagnosis not present

## 2021-06-09 MED ORDER — NAPROXEN 375 MG PO TABS: 375.0000 mg | ORAL_TABLET | Freq: Two times a day (BID) | ORAL | 0 refills | Status: DC

## 2021-06-09 MED ORDER — DOXYCYCLINE HYCLATE 100 MG PO CAPS: 100.0000 mg | ORAL_CAPSULE | Freq: Two times a day (BID) | ORAL | 0 refills | Status: DC

## 2021-06-09 NOTE — Discharge Instructions (Addendum)
You have refused incision and drainage of the infected cyst today.  Please follow-up with your primary care provider if you change your mind about this.  In the meantime, use doxycycline as an antibiotic to address the infection.  Use naproxen for pain and inflammation.

## 2021-06-09 NOTE — ED Triage Notes (Signed)
Patient c/o possible abscess on her upper back for a couple of days.  The area is hard and painful.  Patient has been applying warm compressions to the area.

## 2021-06-09 NOTE — ED Provider Notes (Signed)
[facility]   MRN: [id number] DOB: Sep 26, 1980  Subjective:   Tonya Owens is a 41 y.o. female presenting for 2-day history of acute onset mildly painful draining mass of the upper right back.  Patient has had a bump there for a long time but in past couple of days has started to drain.  She has been applying compresses to the area.  Denies fever, nausea, vomiting, bleeding.  No current facility-administered medications for this encounter.  Current Outpatient Medications:    albuterol (PROVENTIL HFA;VENTOLIN HFA) 108 (90 Base) MCG/ACT inhaler, Inhale 2 puffs into the lungs every 4 (four) hours as needed for wheezing or shortness of breath. (Patient not taking: Reported on 04/17/2020), Disp: 1 Inhaler, Rfl: 0   fexofenadine (ALLEGRA ALLERGY) 60 MG tablet, Take 1 tablet (60 mg total) by mouth 2 (two) times daily., Disp: 60 tablet, Rfl: 0   fluticasone (FLONASE) 50 MCG/ACT nasal spray, Place 2 sprays into both nostrils daily., Disp: 11.1 mL, Rfl: 2   metoprolol succinate (TOPROL-XL) 25 MG 24 hr tablet, Take 1 tablet (25 mg total) by mouth daily. Call [phone] to schedule a visit for blood pressure before next refill, Disp: 60 tablet, Rfl: 0   nitrofurantoin, macrocrystal-monohydrate, (MACROBID) 100 MG capsule, Take 1 capsule (100 mg total) by mouth 2 (two) times daily. (Patient not taking: Reported on 05/08/2020), Disp: 10 capsule, Rfl: 0   triamcinolone cream (KENALOG) 0.1 %, Apply 1 application topically 2 (two) times daily. (Patient not taking: Reported on 04/17/2020), Disp: 30 g, Rfl: 0   Allergies  Allergen Reactions   Omeprazole Hives and Shortness Of Breath   Amlodipine     Jittery    Past Medical History:  Diagnosis Date   Asthma    Hypertension      No past surgical history on file.  Family History  Problem Relation Age of Onset   Hypertension Mother    Diabetes Father    Diabetes Maternal Grandmother    Hypertension Maternal Grandmother     Social  History   Tobacco Use   Smoking status: Former    Packs/day: 0.50    Years: 13.00    Pack years: 6.50    Types: Cigarettes    Start date: 07/24/1996    Quit date: 06/10/2012    Years since quitting: 9.0   Smokeless tobacco: Never  Vaping Use   Vaping Use: Never used  Substance Use Topics   Alcohol use: No   Drug use: No    ROS   Objective:   Vitals: BP 131/79 (BP Location: Left Arm)    Pulse 93    Temp 99.3 F (37.4 C) (Oral)    SpO2 98%   Physical Exam Constitutional:      General: She is not in acute distress.    Appearance: Normal appearance. She is well-developed. She is obese. She is not ill-appearing, toxic-appearing or diaphoretic.  HENT:     Head: Normocephalic and atraumatic.     Nose: Nose normal.     Mouth/Throat:     Mouth: Mucous membranes are moist.  Eyes:     General: No scleral icterus.       Right eye: No discharge.        Left eye: No discharge.     Extraocular Movements: Extraocular movements intact.  Cardiovascular:     Rate and Rhythm: Normal rate.  Pulmonary:     Effort: Pulmonary effort is normal.  Skin:    General: Skin is warm  and dry.       Neurological:     General: No focal deficit present.     Mental Status: She is alert and oriented to person, place, and time.  Psychiatric:        Mood and Affect: Mood normal.        Behavior: Behavior normal.    Assessment and Plan :   PDMP not reviewed this encounter.  1. Infected cyst of skin    Patient was uncomfortable and anxious about having a procedure that she did not plan for done.  Ultimately she refused the incision and drainage and removal of the cyst.  I did not recommend this.  However, counseled that she can trial doxycycline and use naproxen for pain and inflammation.  Patient states that she would like to try and schedule removal of this with her PCP. Counseled patient on potential for adverse effects with medications prescribed/recommended today, ER and return-to-clinic  precautions discussed, patient verbalized understanding.    Wallis Bamberg, [place] 06/09/21 1706

## 2021-07-06 ENCOUNTER — Ambulatory Visit (INDEPENDENT_AMBULATORY_CARE_PROVIDER_SITE_OTHER): Payer: Medicaid Other

## 2021-07-06 ENCOUNTER — Other Ambulatory Visit: Payer: Self-pay

## 2021-07-06 VITALS — BP 145/79 | HR 100 | Wt 229.5 lb

## 2021-07-06 DIAGNOSIS — Z3042 Encounter for surveillance of injectable contraceptive: Secondary | ICD-10-CM

## 2021-07-06 MED ORDER — MEDROXYPROGESTERONE ACETATE 150 MG/ML IM SUSP
150.0000 mg | Freq: Once | INTRAMUSCULAR | Status: AC
  Administered 2021-07-06: 150 mg via INTRAMUSCULAR

## 2021-07-06 NOTE — Progress Notes (Signed)
Tonya Owens here for Depo-Provera Injection. Injection administered without complication. Patient will return in 3 months for next injection between 09/21/21 and 10/05/21. Next annual visit due December 2023.   Pt reports being seen for abscess at urgent care last month. States she took single antibiotic pill and had episode of heart racing. Did not complete antibiotic due to concern about side effects. Recommended patient follow up with Family Medicine provider or Urgent Care to notify them of this reaction and to ask about any further antibiotics needed.  [name], RN 07/06/2021  2:29 PM

## 2021-09-21 ENCOUNTER — Ambulatory Visit: Payer: Medicaid Other

## 2021-09-23 ENCOUNTER — Ambulatory Visit (INDEPENDENT_AMBULATORY_CARE_PROVIDER_SITE_OTHER): Payer: Medicaid Other

## 2021-09-23 VITALS — BP 125/67 | HR 98 | Wt 231.0 lb

## 2021-09-23 DIAGNOSIS — Z3042 Encounter for surveillance of injectable contraceptive: Secondary | ICD-10-CM

## 2021-09-23 MED ORDER — MEDROXYPROGESTERONE ACETATE 150 MG/ML IM SUSP
150.0000 mg | Freq: Once | INTRAMUSCULAR | Status: AC
  Administered 2021-09-23: 150 mg via INTRAMUSCULAR

## 2021-09-23 NOTE — Progress Notes (Signed)
Tonya Owens here for Depo-Provera Injection. Injection administered without complication. Patient will return in 3 months for next injection between August 2nd and August 16th. Next annual visit due December 2023.  ? ?Seth Bake, RN ?09/23/2021   ?

## 2021-10-13 ENCOUNTER — Encounter: Payer: Self-pay | Admitting: *Deleted

## 2021-10-13 ENCOUNTER — Ambulatory Visit: Payer: Medicaid Other

## 2021-12-09 ENCOUNTER — Ambulatory Visit (INDEPENDENT_AMBULATORY_CARE_PROVIDER_SITE_OTHER): Payer: Medicaid Other

## 2021-12-09 ENCOUNTER — Other Ambulatory Visit (HOSPITAL_COMMUNITY)
Admission: RE | Admit: 2021-12-09 | Discharge: 2021-12-09 | Disposition: A | Discharge status: Home / Self Care | Payer: Medicaid Other | Source: Ambulatory Visit | Attending: Family Medicine | Admitting: Family Medicine

## 2021-12-09 ENCOUNTER — Other Ambulatory Visit: Payer: Self-pay

## 2021-12-09 VITALS — BP 135/65 | HR 99 | Wt 228.4 lb

## 2021-12-09 DIAGNOSIS — Z3042 Encounter for surveillance of injectable contraceptive: Secondary | ICD-10-CM | POA: Diagnosis not present

## 2021-12-09 DIAGNOSIS — N898 Other specified noninflammatory disorders of vagina: Secondary | ICD-10-CM | POA: Diagnosis not present

## 2021-12-09 MED ORDER — MEDROXYPROGESTERONE ACETATE 150 MG/ML IM SUSP
150.0000 mg | Freq: Once | INTRAMUSCULAR | Status: AC
  Administered 2021-12-09: 150 mg via INTRAMUSCULAR

## 2021-12-09 NOTE — Progress Notes (Signed)
Tonya Owens here for Depo-Provera Injection. Injection administered without complication. Patient will return in 3 months for next injection between October 18 and November 1. Next annual visit due December 2023.   Patient has complaint today of vaginal itching. I instructed patient on how to collect a self swab. Self swab collected without issue. I explained to patient we will notify her of any abnormal results. Patient verbalized understanding and denies any other questions.   Cline Crock, RN 12/09/2021

## 2021-12-10 ENCOUNTER — Telehealth: Payer: Self-pay

## 2021-12-10 ENCOUNTER — Other Ambulatory Visit: Payer: Self-pay | Admitting: Family Medicine

## 2021-12-10 DIAGNOSIS — B9689 Other specified bacterial agents as the cause of diseases classified elsewhere: Secondary | ICD-10-CM

## 2021-12-10 LAB — CERVICOVAGINAL ANCILLARY ONLY
Bacterial Vaginitis (gardnerella): POSITIVE — AB
Candida Glabrata: NEGATIVE
Candida Vaginitis: NEGATIVE
Chlamydia: NEGATIVE
Comment: NEGATIVE
Comment: NEGATIVE
Comment: NEGATIVE
Comment: NEGATIVE
Comment: NEGATIVE
Comment: NORMAL
Neisseria Gonorrhea: NEGATIVE
Trichomonas: NEGATIVE

## 2021-12-10 MED ORDER — METRONIDAZOLE 500 MG PO TABS: 500.0000 mg | ORAL_TABLET | Freq: Two times a day (BID) | ORAL | 0 refills | Status: DC

## 2021-12-10 NOTE — Telephone Encounter (Signed)
Call placed to pt. Spoke with pt. Pt given results and recommendations per Dr Alvester Morin. Pt verbalized understanding. Pt agreeable to plan of care. Did change Rx to another pharmacy per pt's request.  Tonya Owens

## 2021-12-10 NOTE — Telephone Encounter (Signed)
-----   Message from Federico Flake, MD sent at 12/10/2021  3:33 PM EDT ----- BV present. Sent in medication to pharmacy on [address]

## 2022-02-05 ENCOUNTER — Other Ambulatory Visit: Payer: Self-pay | Admitting: *Deleted

## 2022-02-05 MED ORDER — METOPROLOL SUCCINATE ER 25 MG PO TB24: 25.0000 mg | ORAL_TABLET | Freq: Every day | ORAL | 0 refills | Status: DC

## 2022-02-05 NOTE — Telephone Encounter (Signed)
Metoprolol refilled for 15 days. Needs visits before next fill.  Dorris Singh, MD  Family Medicine Teaching Service

## 2022-02-09 IMAGING — US US OB < 14 WEEKS - US OB TV
1 series · 15 of 28 positions shown · non-contrast
Comparison: None.

CLINICAL DATA: Right lower quadrant pain with positive pregnancy
test.

EXAM:
OBSTETRIC <14 WK US AND TRANSVAGINAL OB US
TECHNIQUE: Both transabdominal and transvaginal ultrasound examinations were
performed for complete evaluation of the gestation as well as the
maternal uterus, adnexal regions, and pelvic cul-de-sac.
Transvaginal technique was performed to assess early pregnancy.

[Series 1: us ob < 14 weeks - us ob tv · 107 acquisitions, 15 frames shown]
[im 1/107]
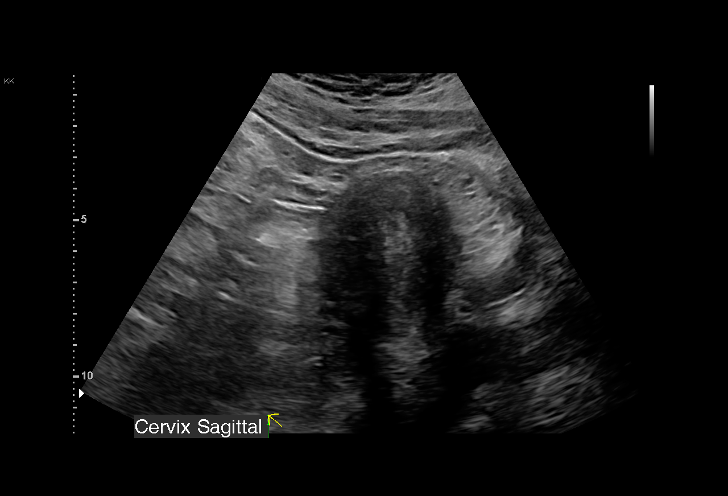
[im 8/107]
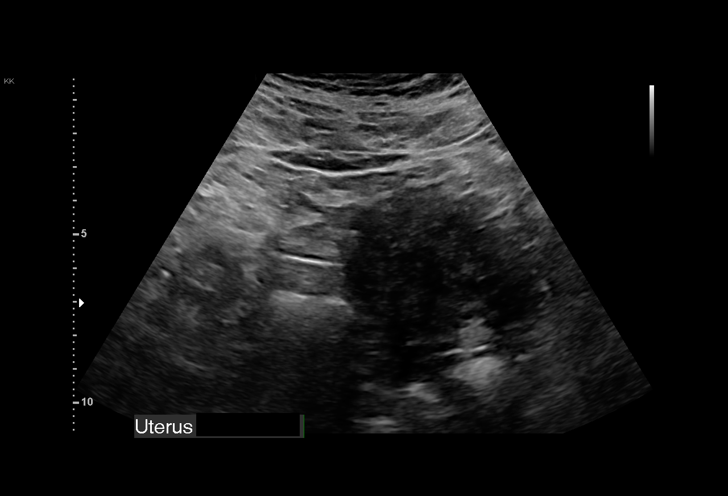
[im 16/107]
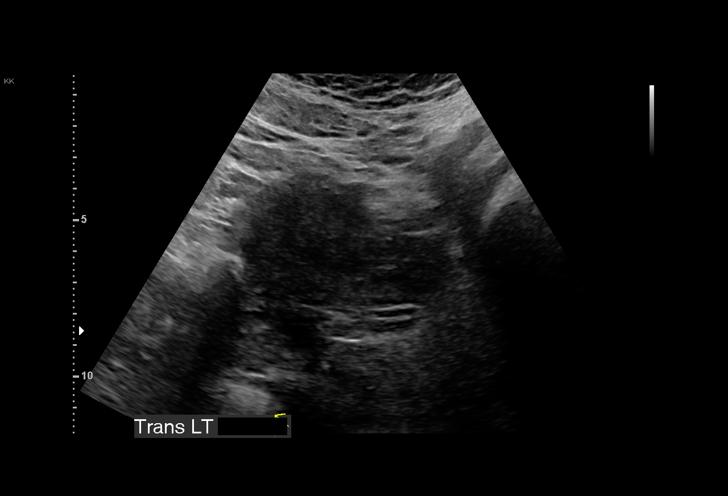
[im 24/107]
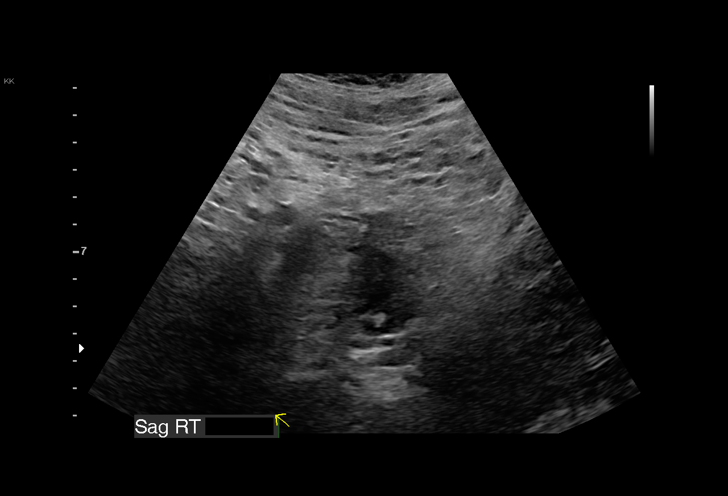
[im 32/107]
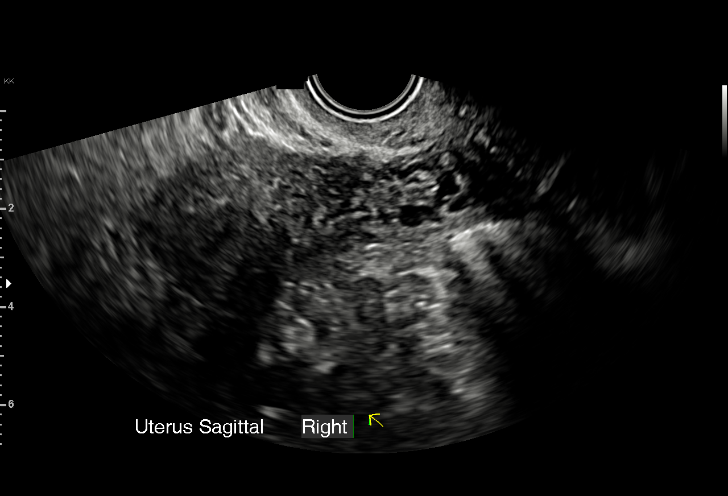
[im 40/107]
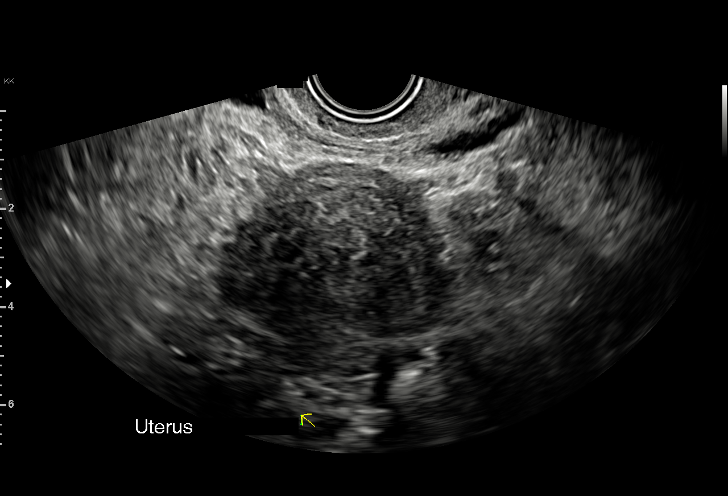
[im 48/107]
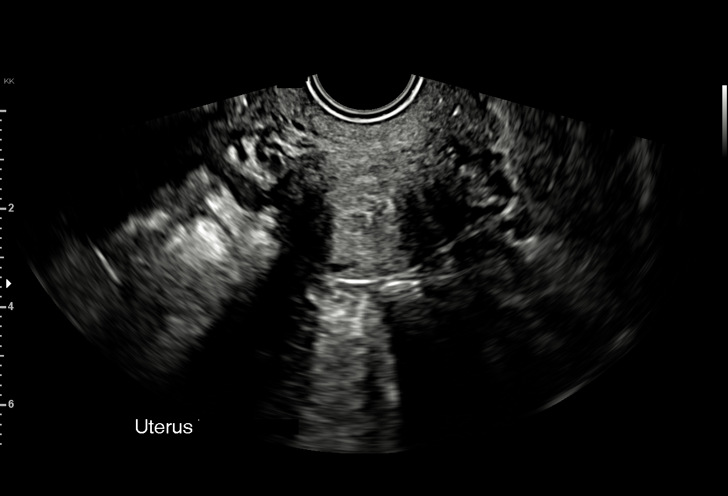
[im 55/107]
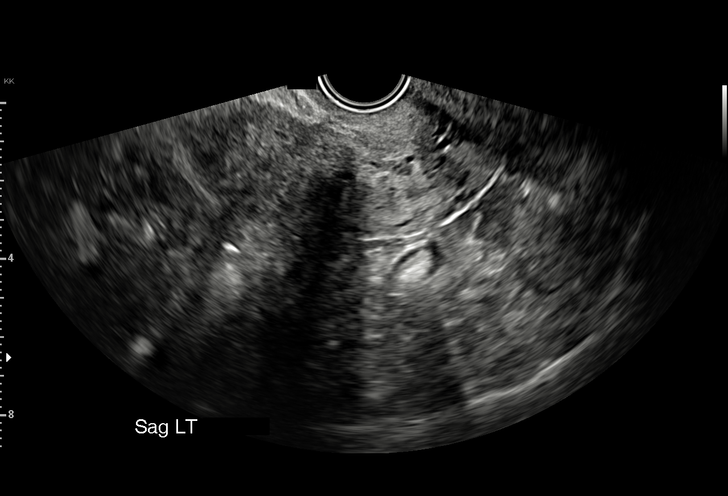
[im 59/107]
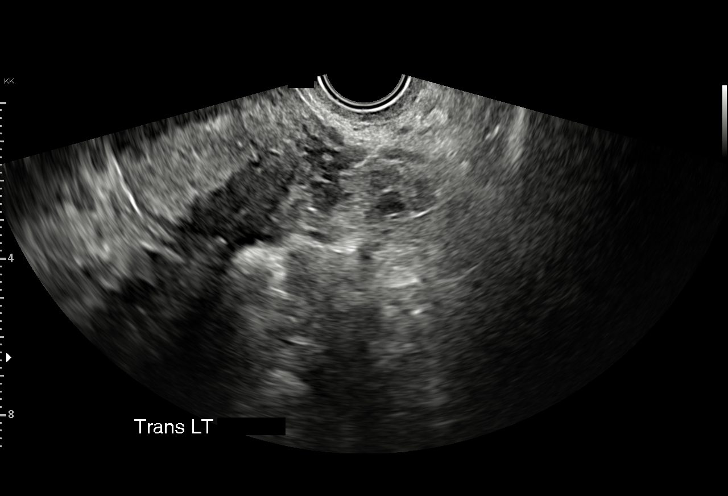
[im 67/107]
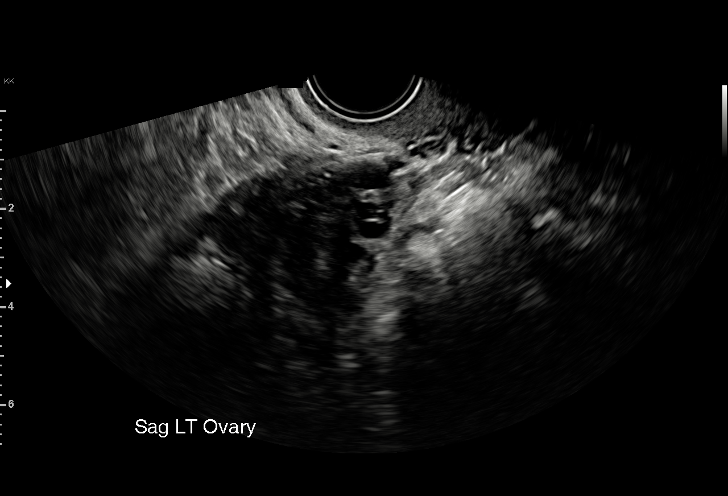
[im 75/107]
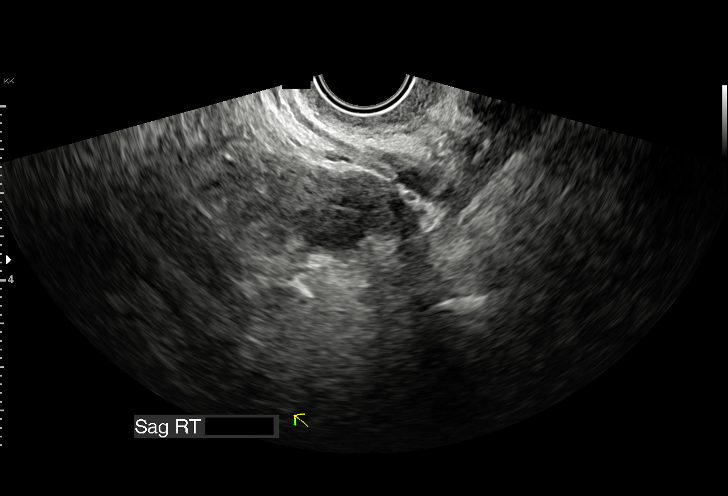
[im 83/107]
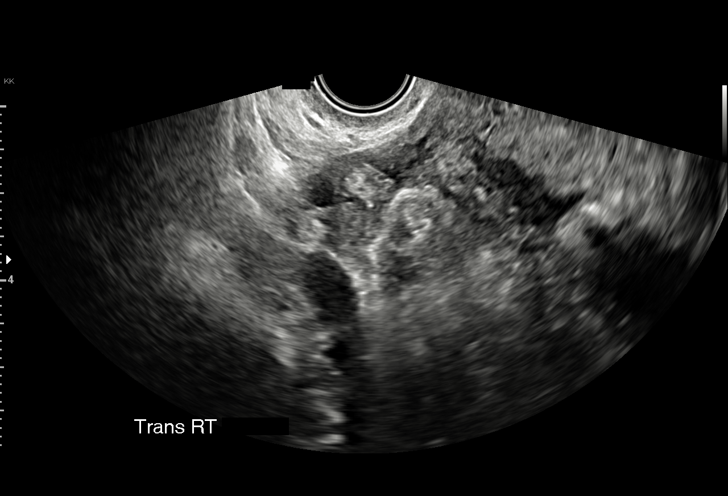
[im 91/107]
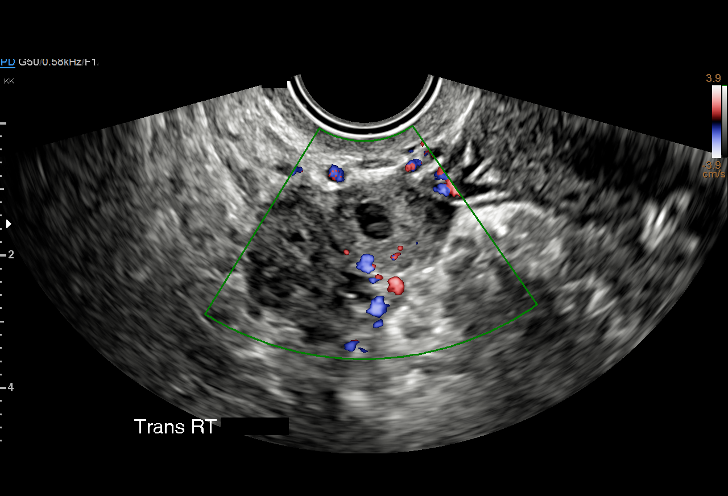
[im 99/107]
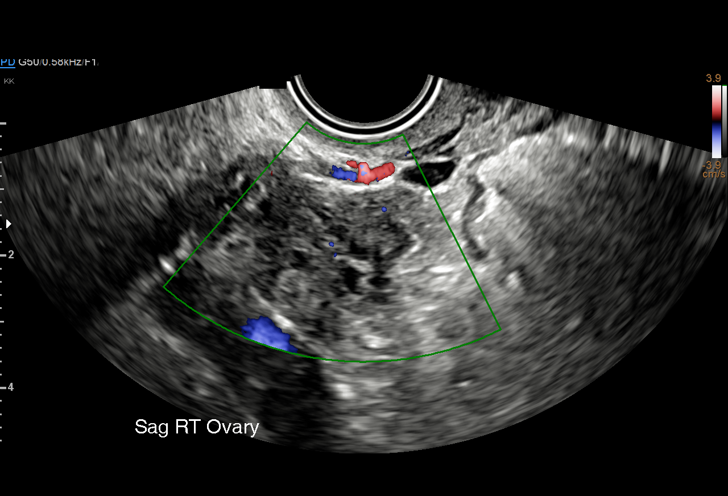
[im 107/107]
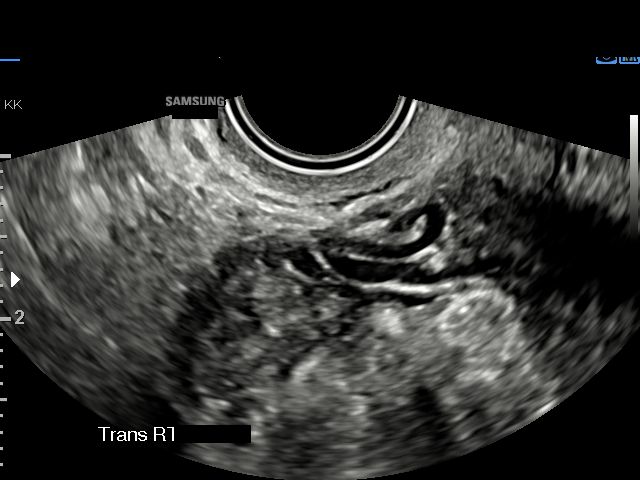

[15 of 28 positions shown; findings below may reference images not displayed]

FINDINGS: Intrauterine gestational sac: No intrauterine gestational sac
visualized.

Subchorionic hemorrhage:  N/A

Maternal uterus/adnexae: Left ovary unremarkable. Small septated
follicle noted in the right ovary. Adjacent to the right ovary is a
16 mm thick walled cystic lesion with peripheral flow signal on
color Doppler imaging. Sonographer reports tenderness in the right
adnexal space on endovaginal scanning. No substantial
intraperitoneal free fluid.
IMPRESSION: 1. No intrauterine gestational sac identified.
2. 16 mm thick walled cystic lesion adjacent to the right ovary is
highly suspicious for ectopic pregnancy. Soccer for reports
tenderness in the right adnexal region on endovaginal scanning. No
evidence for hemoperitoneum at this time.

Critical Value/emergent results were called by telephone at the time
of interpretation on 04/11/2020 at [DATE] to provider PADNOSKA
KIT KEUNG , who verbally acknowledged these results.

## 2022-02-09 NOTE — Telephone Encounter (Signed)
Pt informed and scheduled. Rhayne Chatwin, CMA  

## 2022-02-19 ENCOUNTER — Ambulatory Visit: Payer: Medicaid Other | Admitting: Family Medicine

## 2022-02-19 NOTE — Progress Notes (Deleted)
    SUBJECTIVE:   CHIEF COMPLAINT: BP check  HPI:   Tonya Owens is a 41 y.o.  with history notable for *** presenting for ***.   PERTINENT  PMH / PSH/Family/Social History : ***  OBJECTIVE:   There were no vitals taken for this visit.  Today's weight:  Review of prior weights: There were no vitals filed for this visit.  ***  ASSESSMENT/PLAN:   No problem-specific Assessment & Plan notes found for this encounter.     {    This will disappear when note is signed, click to select method of visit    :1}  Dorris Singh, [place]

## 2022-02-24 ENCOUNTER — Ambulatory Visit: Payer: Medicaid Other

## 2022-06-21 ENCOUNTER — Other Ambulatory Visit (HOSPITAL_COMMUNITY)
Admission: RE | Admit: 2022-06-21 | Discharge: 2022-06-21 | Disposition: A | Discharge status: Home / Self Care | Payer: Medicaid Other | Source: Ambulatory Visit | Attending: Family Medicine | Admitting: Family Medicine

## 2022-06-21 ENCOUNTER — Encounter: Payer: Self-pay | Admitting: Family Medicine

## 2022-06-21 ENCOUNTER — Ambulatory Visit (INDEPENDENT_AMBULATORY_CARE_PROVIDER_SITE_OTHER): Payer: Medicaid Other | Admitting: Family Medicine

## 2022-06-21 VITALS — BP 137/84 | HR 103

## 2022-06-21 DIAGNOSIS — Z1231 Encounter for screening mammogram for malignant neoplasm of breast: Secondary | ICD-10-CM | POA: Diagnosis not present

## 2022-06-21 DIAGNOSIS — Z3002 Counseling and instruction in natural family planning to avoid pregnancy: Secondary | ICD-10-CM

## 2022-06-21 DIAGNOSIS — Z01419 Encounter for gynecological examination (general) (routine) without abnormal findings: Secondary | ICD-10-CM | POA: Insufficient documentation

## 2022-06-21 DIAGNOSIS — Z3042 Encounter for surveillance of injectable contraceptive: Secondary | ICD-10-CM

## 2022-06-21 LAB — POCT PREGNANCY, URINE: Preg Test, Ur: NEGATIVE

## 2022-06-21 MED ORDER — MEDROXYPROGESTERONE ACETATE 150 MG/ML IM SUSP: 150.0000 mg | INTRAMUSCULAR | Status: AC

## 2022-06-21 MED ORDER — MEDROXYPROGESTERONE ACETATE 150 MG/ML IM SUSP: 150.0000 mg | Freq: Once | INTRAMUSCULAR | Status: DC

## 2022-06-21 NOTE — Progress Notes (Signed)
GYNECOLOGY ANNUAL PREVENTATIVE CARE ENCOUNTER NOTE  Subjective:   Tonya Owens is a 42 y.o. G35P2011 female here for a routine annual gynecologic exam.  Current complaints: None.     Menses are irregular, has intermittent spotting. No recent unprotected sex. Last unprotected sex was > 2 weeks prior.  Last depo was in August.   Denies abnormal vaginal bleeding, discharge, pelvic pain, problems with intercourse or other gynecologic concerns.    Gynecologic History No LMP recorded. Patient has had an injection. Contraception: Depo-Provera injections Last Pap: 2021. Results were: normal, HPV NEG Last mammogram: never had  Health Maintenance Due  Topic Date Due   COVID-19 Vaccine (1) Never done   INFLUENZA VACCINE  Never done   DTaP/Tdap/Td (2 - Td or Tdap) 02/13/2022    The following portions of the patient's history were reviewed and updated as appropriate: allergies, current medications, past family history, past medical history, past social history, past surgical history and problem list.  Review of Systems Pertinent items are noted in HPI.   Objective:  BP 137/84   Pulse (!) 103  CONSTITUTIONAL: Well-developed, well-nourished female in no acute distress.  HENT:  Normocephalic, atraumatic, External right and left ear normal. Oropharynx is clear and moist EYES:  No scleral icterus.  NECK: Normal range of motion, supple, no masses.  Normal thyroid.  SKIN: Skin is warm and dry. No rash noted. Not diaphoretic. No erythema. No pallor. NEUROLOGIC: Alert and oriented to person, place, and time. Normal reflexes, muscle tone coordination. No cranial nerve deficit noted. PSYCHIATRIC: Normal mood and affect. Normal behavior. Normal judgment and thought content. CARDIOVASCULAR: Normal heart rate noted, regular rhythm. 2+ distal pulses. RESPIRATORY: Effort and breath sounds normal, no problems with respiration noted. BREASTS: Declined, based on time ABDOMEN: Soft,  no distention  noted.  No tenderness, rebound or guarding.  PELVIC: Declined- pap up to date MUSCULOSKELETAL: Normal range of motion.   Assessment and Plan:  1) Annual gynecologic examination:  Will follow up results of pap smear and manage accordingly. STI screening desired Yes.  Routine preventative health maintenance measures emphasized. Reviewed perimenopausal symptoms and management.   2) Contraception counseling: Reviewed all forms of birth control options available including abstinence; over the counter/barrier methods; hormonal contraceptive medication including pill, patch, ring, injection,contraceptive implant; hormonal and nonhormonal IUDs; permanent sterilization options including vasectomy and the various tubal sterilization modalities. Risks and benefits reviewed.  Questions were answered.  Written information was also given to the patient to review.  Patient desires Depo, this was prescribed for patient. She will follow up in  3 mon for surveillance.  She was told to call with any further questions, or with any concerns about this method of contraception.  Emphasized use of condoms 100% of the time for STI prevention.   1. Encounter for surveillance of injectable contraceptive - UPT was negative today - medroxyPROGESTERone (DEPO-PROVERA) injection 150 mg - Renewal for 1 year   2. Well woman exam with routine gynecological exam - MM 3D SCREEN BREAST BILATERAL; Future - Accepts GC/CT screening - Declines breast and pelvic exam - Has not had mammogram-- cited time and needing to work as barriers to getting her screenings and visits.  - Initial BP was elevated but was normal on repeat - Given info on other methods  3. Encounter for screening mammogram for malignant neoplasm of breast - MM 3D SCREEN BREAST BILATERAL; Future  Please refer to After Visit Summary for other counseling recommendations.   Return in about  3 months (around 09/19/2022) for Depo.  Caren Macadam, MD, MPH,  ABFM Attending Physician Center for [facility]

## 2022-06-21 NOTE — Progress Notes (Signed)
Tonya Owens here for Depo-Provera Injection. Injection administered without complication. Patient will return in 3 months for next injection between 04/30 and 05/14. Next annual visit due Today.   Martina Sinner, RN 06/21/2022  2:23 PM

## 2022-06-22 LAB — CERVICOVAGINAL ANCILLARY ONLY

## 2022-08-27 ENCOUNTER — Other Ambulatory Visit: Payer: Self-pay | Admitting: *Deleted

## 2022-08-27 NOTE — Telephone Encounter (Signed)
Must be seen before refill.  Tonya Starr, MD  Family Medicine Teaching Service

## 2022-08-27 NOTE — Telephone Encounter (Signed)
LMVM for pt to call back and make an appt. Tonya Owens Tonya Owens, CMA

## 2022-08-30 ENCOUNTER — Other Ambulatory Visit: Payer: Self-pay

## 2022-08-30 MED ORDER — METOPROLOL SUCCINATE ER 25 MG PO TB24: 25.0000 mg | ORAL_TABLET | Freq: Every day | ORAL | 0 refills | Status: DC

## 2022-09-06 ENCOUNTER — Other Ambulatory Visit: Payer: Self-pay | Admitting: Family Medicine

## 2022-09-06 NOTE — Telephone Encounter (Signed)
Patient must have visit before fill. Has not been seen in 2 years.

## 2022-09-20 ENCOUNTER — Ambulatory Visit (INDEPENDENT_AMBULATORY_CARE_PROVIDER_SITE_OTHER): Payer: Medicaid Other | Admitting: *Deleted

## 2022-09-20 VITALS — BP 141/79 | HR 103 | Ht 65.0 in | Wt 234.2 lb

## 2022-09-20 DIAGNOSIS — Z3042 Encounter for surveillance of injectable contraceptive: Secondary | ICD-10-CM | POA: Diagnosis not present

## 2022-09-20 MED ORDER — MEDROXYPROGESTERONE ACETATE 150 MG/ML IM SUSP: 150.0000 mg | Freq: Once | INTRAMUSCULAR | Status: DC

## 2022-09-20 MED ORDER — MEDROXYPROGESTERONE ACETATE 150 MG/ML IM SUSY
150.0000 mg | PREFILLED_SYRINGE | Freq: Once | INTRAMUSCULAR | Status: AC
  Administered 2022-09-20: 150 mg via INTRAMUSCULAR

## 2022-09-20 NOTE — Progress Notes (Signed)
Tonya Owens here for Depo-Provera  Injection.  Injection administered without complication. Patient will return in 3 months for next injection. Had exam without pap 06/21/22. Last injection was 06/21/22. Sent to desk to schedule pap and next injection. Nancy Fetter 09/20/2022  2:47 PM

## 2022-10-22 ENCOUNTER — Other Ambulatory Visit: Payer: Self-pay | Admitting: Family Medicine

## 2022-10-25 ENCOUNTER — Ambulatory Visit: Payer: Medicaid Other | Admitting: Obstetrics and Gynecology

## 2022-11-29 ENCOUNTER — Other Ambulatory Visit: Payer: Self-pay

## 2022-11-29 MED ORDER — METOPROLOL SUCCINATE ER 25 MG PO TB24: 25.0000 mg | ORAL_TABLET | Freq: Every day | ORAL | 0 refills | Status: DC

## 2022-12-06 ENCOUNTER — Other Ambulatory Visit: Payer: Self-pay

## 2022-12-06 ENCOUNTER — Ambulatory Visit: Payer: Medicaid Other

## 2022-12-13 ENCOUNTER — Ambulatory Visit (INDEPENDENT_AMBULATORY_CARE_PROVIDER_SITE_OTHER): Payer: Medicaid Other | Admitting: Family Medicine

## 2022-12-13 ENCOUNTER — Other Ambulatory Visit (HOSPITAL_COMMUNITY)
Admission: RE | Admit: 2022-12-13 | Discharge: 2022-12-13 | Disposition: A | Discharge status: Home / Self Care | Payer: Medicaid Other | Source: Ambulatory Visit | Attending: Family Medicine | Admitting: Family Medicine

## 2022-12-13 ENCOUNTER — Encounter: Payer: Self-pay | Admitting: Family Medicine

## 2022-12-13 VITALS — BP 139/84 | HR 111 | Ht 65.0 in | Wt 235.8 lb

## 2022-12-13 DIAGNOSIS — I10 Essential (primary) hypertension: Secondary | ICD-10-CM

## 2022-12-13 DIAGNOSIS — E669 Obesity, unspecified: Secondary | ICD-10-CM

## 2022-12-13 DIAGNOSIS — Z0000 Encounter for general adult medical examination without abnormal findings: Secondary | ICD-10-CM | POA: Insufficient documentation

## 2022-12-13 MED ORDER — METOPROLOL SUCCINATE ER 25 MG PO TB24: 25.0000 mg | ORAL_TABLET | Freq: Every day | ORAL | 3 refills | Status: DC

## 2022-12-13 MED ORDER — MEDROXYPROGESTERONE ACETATE 150 MG/ML IM SUSY
150.0000 mg | PREFILLED_SYRINGE | Freq: Once | INTRAMUSCULAR | Status: AC
Start: 2022-12-13 — End: 2022-12-13
  Administered 2022-12-13: 150 mg via INTRAMUSCULAR

## 2022-12-13 NOTE — Patient Instructions (Signed)
Thank you for visiting today - it was great to see you!  Today we discussed a few health maintenance items. We collected an STI/STD sample and performed a cervical exam, which was normal. We also gave you your Depo shot today. We will also collect some maintenance labs today, checking in on your HgA1c and cholesterol. Further, we discussed your BP medications and plan to continue that as before. Please plan to return to clinic in 3 months to recheck your blood pressure. Please call to schedule this appt.   Please reach out to our clinic with any questions or concerns you may have. Thank you and have a great week!

## 2022-12-13 NOTE — Progress Notes (Signed)
    SUBJECTIVE:   Chief compliant/HPI: Health maintenance   Tonya Owens is a 42 y.o. pmh of HTN who presents today for STI/STD testing and annual labwork.   HTN: Patient has not been regularly taking BP medications. She last took one dose yesterday. She does not check BP at home. She has a R-sided 5/10 headache 1-2 days a week; episodes last a few minutes, tylenol helps. No CP/VC. Some SOB with anxiety. No dizziness or lightheadedness.   STI/STD testing: Patient would like a routine STI/STD screening today. She has not noticed any vaginal discharge changes, pain or burning with urination, or other new symptoms.   Birth control: Patient receives Depo shot, last given 09/20/2022. She is due for another today.   Family hx DM: patient describes a strong family history of diabetes in her mother, father, and paternal grandparents. She would like to check her HgbA1c.   No recent cough or congestion. No N/V/D.   OBJECTIVE:   BP 139/84   Pulse (!) 111   Ht 5\' 5"  (1.651 m)   Wt 235 lb 12.8 oz (107 kg)   SpO2 100%   BMI 39.24 kg/m   General: Well-appearing, no acute distress. CV: Normal S1/S2. No extra heart sounds. Warm and well-perfused. Pulm: CTAB no increased WOB. Abd: Soft, nontender, nondistended. Skin: Warm, dry.  Cervical exam: Normal appearing cervix. Normal rugation of vaginal wall. No lesions or discharge noted.   ASSESSMENT/PLAN:    Annual Examination  Blood pressure reviewed and at goal. Refilled Toprol 25mg  prescription, follow up in 3 mos.  The patient currently uses depo for contraception, received another dose today.  PHQ score 9. No current SI.   Considered the following items based upon USPSTF recommendations: Diabetes screening: ordered Screening for elevated cholesterol: ordered HIV testing: ordered Syphilis if at high risk: ordered GC/CT ordered. BMP ordered.  Cervical cancer screening: Prior pap with co-test in 2021 was normal. Repeat due in  2026. Breast cancer screening: Ordered by Dr Alvester Morin at [facility] in 06/2022 Colorectal cancer screening: not applicable given age.    Follow up in 1 year or sooner if indicated.    Ivery Quale, MD [facility]

## 2022-12-14 ENCOUNTER — Encounter: Payer: Self-pay | Admitting: Family Medicine

## 2022-12-14 ENCOUNTER — Telehealth: Payer: Self-pay | Admitting: Family Medicine

## 2022-12-14 ENCOUNTER — Telehealth: Payer: Self-pay

## 2022-12-14 NOTE — Addendum Note (Signed)
Addended by: Ivery Quale on: 12/14/2022 08:55 PM   Modules accepted: Level of Service

## 2022-12-14 NOTE — Telephone Encounter (Signed)
Called and spoke with patient. Reviewed recent lab results, including elevated HgA1c. Patient to call and schedule a clinic appointment for more in depth diabetes care and management.

## 2022-12-14 NOTE — Telephone Encounter (Signed)
Patient calls nurse line requesting to speak with provider regarding next steps related to elevated A1C.   She reports that she was able to view these results via mychart and is asking if she should be started on medication for this.   Please return call to patient at [phone].  Forwarding to Dr. Threasa Beards (had OV with patient on 12/13/22)  Veronda Prude, RN

## 2022-12-15 ENCOUNTER — Ambulatory Visit: Payer: Medicaid Other | Admitting: Obstetrics and Gynecology

## 2022-12-17 ENCOUNTER — Telehealth: Payer: Self-pay | Admitting: Family Medicine

## 2022-12-17 NOTE — Telephone Encounter (Signed)
Called to schedule patient appointment for elevated HgA1c lab result/BP. LVM for patient to call back, please assist in scheduling.  Appointment can be with any doctor.   Thanks!

## 2022-12-27 ENCOUNTER — Ambulatory Visit: Payer: Medicaid Other | Admitting: Family Medicine

## 2022-12-30 NOTE — Progress Notes (Signed)
    SUBJECTIVE:   CHIEF COMPLAINT: newly diagnosed DM HPI:   Story I Tonya Owens is a 42 y.o.  with history notable for HTN, obesity and A1C of 7.1 presenting for discussion on diabetes.  She recently saw Dr. Threasa Beards for an annual exam. A1C was 7.1. She endorses no symptoms of this.  She does report she drinks a good bit of lemonade.  She has been trying to increase her protein.  The diagnosis of diabetes has made her slightly depressed.  She is interested in meeting with a nutritionist.  Multiple of her family members have type 2 diabetes and she is aware of some of the medications.  No polyuria polydipsia.  The patient does report intermittent calf pain.  She denies excessive swelling redness or injury.Minimal edema at end of day.   PERTINENT  PMH / PSH/Family/Social History :  Anemia Obesity  HTN    OBJECTIVE:   BP 134/78   Pulse 88   Ht 5\' 5"  (1.651 m)   Wt 237 lb (107.5 kg)   SpO2 98%   BMI 39.44 kg/m   Today's weight:  Last Weight  Most recent update: 12/31/2022 10:43 AM    Weight  107.5 kg (237 lb)            Review of prior weights: [employer]   12/31/22 1043  Weight: 237 lb (107.5 kg)   RRR Lungs clear bilaterally LE without edema, erythema or masses   ASSESSMENT/PLAN:   Essential hypertension, benign BP at goal Needs to transition agents Discussed briefly today ARB at follow up  On Depo for contraception   Type 2 diabetes mellitus without complication, without long-term current use of insulin (HCC) Discussed pathophysiology, diagnosis, A1c goal, treatments, potential complications of disease and disease process in general We discussed the meaning of A1c and why we monitor it Discuss starting treatment options.  Given she is over for a and on Depo-Provera starting a statin today discussed metformin versus Ozempic.  Would also likely benefit from an SGLT2 inhibitor in the future.  Start metformin today she will message about the Ozempic information  provided discussed the side effects, benefits and risks.  No family history of medullary thyroid cancer. - Nutrition referral  - Schedule with Dr. Raymondo Band to consider CGM - Referral to Ophtho   Muscle Cramp Possibly due to dehydration (drinks little water), also considered iron deficiency, low magnesium. BMP, Mag, ferritin, CBC today   At next visit - discuss possible ARB  - Alternative to Depo (gave information for Mirena and Nexplanon)     Terisa Starr, MD  Family Medicine Teaching Service  [facility]

## 2022-12-31 ENCOUNTER — Encounter: Payer: Self-pay | Admitting: Family Medicine

## 2022-12-31 ENCOUNTER — Ambulatory Visit (INDEPENDENT_AMBULATORY_CARE_PROVIDER_SITE_OTHER): Payer: Medicaid Other | Admitting: Family Medicine

## 2022-12-31 VITALS — BP 134/78 | HR 88 | Ht 65.0 in | Wt 237.0 lb

## 2022-12-31 DIAGNOSIS — E119 Type 2 diabetes mellitus without complications: Secondary | ICD-10-CM | POA: Insufficient documentation

## 2022-12-31 DIAGNOSIS — R252 Cramp and spasm: Secondary | ICD-10-CM | POA: Diagnosis not present

## 2022-12-31 DIAGNOSIS — I10 Essential (primary) hypertension: Secondary | ICD-10-CM | POA: Diagnosis not present

## 2022-12-31 MED ORDER — METFORMIN HCL ER 500 MG PO TB24: 500.0000 mg | ORAL_TABLET | Freq: Every day | ORAL | 3 refills | Status: DC

## 2022-12-31 MED ORDER — ATORVASTATIN CALCIUM 20 MG PO TABS
20.0000 mg | ORAL_TABLET | Freq: Every day | ORAL | 3 refills | Status: AC
Start: 2022-12-31 — End: ?

## 2022-12-31 NOTE — Assessment & Plan Note (Signed)
BP at goal Needs to transition agents Discussed briefly today ARB at follow up  On Depo for contraception

## 2022-12-31 NOTE — Patient Instructions (Addendum)
It was wonderful to see you today.  Please bring ALL of your medications with you to every visit.   Today we talked about:  Type 2 diabetes This type of diabetes means some of your body cells are resistant to insulin.  Insulin is one of the hormones that helps regulate sugar in her body.    We measure something called a hemoglobin A1c for patients with diabetes.  This can be measured every 3 months.  This is a long-term marker of sugar control.  Your hemoglobin A1c was 7.1 at your last visit.  We will repeat this in November..   - Starting metformin   This is a once a day medication.  The lowest dose is 500 mg.  This is what I have sent in.  You take this medication once a day with food in the morning.  It can cause some loose stools for a day or 2 this should get better with time.  The maximum dose of this medication is 2000 mg we can go up on the metformin over time if it works well for you.   Diabetes puts you at increased risk of heart attack and stroke in the future.  To prevent this I would recommend starting medication called Lipitor.  This is a cholesterol-lowering pill.  This medication has been proven to reduce the risk of heart attack and strokes in people who have type 2 diabetes.  I gave you information about Ozempic.  This is a medication we have used for a long time for diabetes.  It is very popular right now under the brand-name Wegovy.  The side effects can include some stomach upset and nausea.  The benefits are weight loss and a lower hemoglobin A1c.   if you want to consider a continuous glucose monitor please schedule visit with Dr. Raymondo Band    For diabetes  management: Call Dr. Gerilyn Pilgrim (our nutritionist) to set up an appointment. Her phone number is: [phone].  I will message you with labs   Please follow up in 1 months   Thank you for choosing [facility].   Please call [phone] with any questions about today's appointment.  Please be sure to  schedule follow up at the front  desk before you leave today.   Terisa Starr, MD  Family Medicine

## 2022-12-31 NOTE — Assessment & Plan Note (Signed)
Discussed pathophysiology, diagnosis, A1c goal, treatments, potential complications of disease and disease process in general We discussed the meaning of A1c and why we monitor it Discuss starting treatment options.  Given she is over for a and on Depo-Provera starting a statin today discussed metformin versus Ozempic.  Would also likely benefit from an SGLT2 inhibitor in the future.  Start metformin today she will message about the Ozempic information provided discussed the side effects, benefits and risks.  No family history of medullary thyroid cancer.

## 2023-01-01 LAB — IRON,TIBC AND FERRITIN PANEL
Ferritin: 10 ng/mL — ABNORMAL LOW (ref 15–150)
Iron Saturation: 5 % — CL (ref 15–55)
Iron: 25 ug/dL — ABNORMAL LOW (ref 27–159)
Total Iron Binding Capacity: 464 ug/dL — ABNORMAL HIGH (ref 250–450)
UIBC: 439 ug/dL — ABNORMAL HIGH (ref 131–425)

## 2023-01-01 LAB — CBC
Hematocrit: 35.2 % (ref 34.0–46.6)
Hemoglobin: 11.4 g/dL (ref 11.1–15.9)
MCH: 23 pg — ABNORMAL LOW (ref 26.6–33.0)
MCHC: 32.4 g/dL (ref 31.5–35.7)
MCV: 71 fL — ABNORMAL LOW (ref 79–97)
Platelets: 273 10*3/uL (ref 150–450)
RBC: 4.95 x10E6/uL (ref 3.77–5.28)
RDW: 16.8 % — ABNORMAL HIGH (ref 11.7–15.4)
WBC: 5.8 10*3/uL (ref 3.4–10.8)

## 2023-01-01 LAB — MAGNESIUM: Magnesium: 1.8 mg/dL (ref 1.6–2.3)

## 2023-01-01 LAB — BASIC METABOLIC PANEL
BUN/Creatinine Ratio: 9 (ref 9–23)
BUN: 8 mg/dL (ref 6–24)
CO2: 20 mmol/L (ref 20–29)
Calcium: 9.1 mg/dL (ref 8.7–10.2)
Chloride: 102 mmol/L (ref 96–106)
Creatinine, Ser: 0.94 mg/dL (ref 0.57–1.00)
Glucose: 185 mg/dL — ABNORMAL HIGH (ref 70–99)
Potassium: 4.4 mmol/L (ref 3.5–5.2)
Sodium: 135 mmol/L (ref 134–144)
eGFR: 78 mL/min/{1.73_m2} (ref >59)

## 2023-02-03 NOTE — Progress Notes (Deleted)
    SUBJECTIVE:   CHIEF COMPLAINT: diabetes follow up HPI:   SHASHANNA KREITZER is a 42 y.o.  with history notable for type 2 DM, HTN, tobacco use presenting for follow up.  In terms of Ozempic, she would like ***.   In terms of HTN,    PERTINENT  PMH / PSH/Family/Social History : ***  OBJECTIVE:   There were no vitals taken for this visit.  Today's weight:  Review of prior weights: There were no vitals filed for this visit.  ***  ASSESSMENT/PLAN:   Assessment & Plan Type 2 diabetes mellitus without complication, without long-term current use of insulin (HCC)  Essential hypertension, benign  Iron deficiency anemia due to chronic blood loss    {    This will disappear when note is signed, click to select method of visit    :1}  Terisa Starr, MD  Family Medicine Teaching Service  [facility]

## 2023-02-04 ENCOUNTER — Ambulatory Visit: Payer: Medicaid Other | Admitting: Family Medicine

## 2023-02-17 ENCOUNTER — Ambulatory Visit: Payer: Medicaid Other | Admitting: Family Medicine

## 2023-02-17 NOTE — Progress Notes (Deleted)
What do you want to get out of meeting with me today? ***  Relevant history/background: Ms. Dorsainvil was referred by Terisa Starr, MD for nutrition guidance related to HTN (I10) and newly diagnosed Type 2 diabetes mellitus without complication, without long-term use of insulin (E11.9).  Labs on 12/31/22: Ferritin 10 ng/mL; Fe sat 5%; Hgb 11.4 g/dL; Hct 35.2%.  A1c on 12/13/22: 7.1%  Assessment:  On a normal day, how many meals do you have? ***.  How many snacks do you eat per day? ***. What beverages do you drink most days of the week? ***.   What foods do you eat most days of the week? ***.   Usual physical activity: ***. Sleep: Patient estimates average of *** hours of sleep/night.  24-hr recall:  (Up at  AM) B ( AM)-   Snk ( AM)-   L ( PM)-   Snk ( PM)-   D ( PM)-   Snk ( PM)-   Typical day? {yes no:314532}   Intervention: Completed diet and exercise history, and established: - SMART behavioral goals (Specific, Measurable, Action-oriented, Realistic, Time-based.)    1. ***   2. ***   3. ***  - How will you document progress on goals?  ***.   For recommendations and goals, see Patient Instructions.    Follow-up: *** weeks.    Riley Papin,JEANNIE

## 2023-02-18 NOTE — Progress Notes (Signed)
Clinic cancelled due to provider.

## 2023-02-21 ENCOUNTER — Ambulatory Visit: Payer: Medicaid Other | Admitting: Family Medicine

## 2023-02-21 ENCOUNTER — Encounter (INDEPENDENT_AMBULATORY_CARE_PROVIDER_SITE_OTHER): Payer: Medicaid Other | Admitting: Family Medicine

## 2023-02-21 DIAGNOSIS — E119 Type 2 diabetes mellitus without complications: Secondary | ICD-10-CM

## 2023-02-21 DIAGNOSIS — E611 Iron deficiency: Secondary | ICD-10-CM

## 2023-02-21 DIAGNOSIS — Z7984 Long term (current) use of oral hypoglycemic drugs: Secondary | ICD-10-CM

## 2023-02-21 DIAGNOSIS — I10 Essential (primary) hypertension: Secondary | ICD-10-CM

## 2023-02-21 DIAGNOSIS — Z87891 Personal history of nicotine dependence: Secondary | ICD-10-CM

## 2023-04-12 ENCOUNTER — Ambulatory Visit: Payer: Medicaid Other

## 2023-04-25 ENCOUNTER — Ambulatory Visit: Payer: Medicaid Other

## 2023-04-25 VITALS — BP 145/83 | HR 90 | Ht 67.0 in | Wt 231.9 lb

## 2023-04-25 DIAGNOSIS — Z3202 Encounter for pregnancy test, result negative: Secondary | ICD-10-CM | POA: Diagnosis not present

## 2023-04-25 DIAGNOSIS — Z3042 Encounter for surveillance of injectable contraceptive: Secondary | ICD-10-CM | POA: Diagnosis not present

## 2023-04-25 LAB — POCT PREGNANCY, URINE: Preg Test, Ur: NEGATIVE

## 2023-04-25 MED ORDER — MEDROXYPROGESTERONE ACETATE 150 MG/ML IM SUSY
150.0000 mg | PREFILLED_SYRINGE | Freq: Once | INTRAMUSCULAR | Status: AC
Start: 2023-04-25 — End: 2023-04-25
  Administered 2023-04-25: 150 mg via INTRAMUSCULAR

## 2023-04-25 NOTE — Progress Notes (Signed)
Pt here today for Depo Provera after last dose given on 12/13/22 and she is 6 weeks late.   UPT negative.  Pt reports last unprotected sex was about a month ago.  Per standing protocol administered Depo Provera in left deltoid.  Scheduled next Depo Provera 3/10/205.  Pt advised to schedule an appt for pap smear.  Pt verbalized understanding with no further questions.   Addison Naegeli, RN  04/25/23

## 2023-06-06 ENCOUNTER — Encounter: Payer: Self-pay | Admitting: Obstetrics and Gynecology

## 2023-06-06 ENCOUNTER — Other Ambulatory Visit (HOSPITAL_COMMUNITY)
Admission: RE | Admit: 2023-06-06 | Discharge: 2023-06-06 | Disposition: A | Discharge status: Home / Self Care | Payer: Medicaid Other | Source: Ambulatory Visit | Attending: Obstetrics and Gynecology | Admitting: Obstetrics and Gynecology

## 2023-06-06 ENCOUNTER — Other Ambulatory Visit: Payer: Self-pay

## 2023-06-06 ENCOUNTER — Ambulatory Visit: Payer: Medicaid Other | Admitting: Obstetrics and Gynecology

## 2023-06-06 VITALS — BP 144/89 | HR 102 | Wt 232.5 lb

## 2023-06-06 DIAGNOSIS — Z01419 Encounter for gynecological examination (general) (routine) without abnormal findings: Secondary | ICD-10-CM

## 2023-06-06 DIAGNOSIS — Z124 Encounter for screening for malignant neoplasm of cervix: Secondary | ICD-10-CM

## 2023-06-06 NOTE — Patient Instructions (Signed)
[email or web address]  Parked at [employer] on Thursdays - can go between 8:30am-12pm

## 2023-06-06 NOTE — Progress Notes (Signed)
ANNUAL EXAM Patient name: Tonya Owens MRN [id number]  Date of birth: 1980-10-30 Chief Complaint:   Gynecologic Exam  History of Present Illness:   Tonya Owens is a 43 y.o. [phone] being seen today for a routine annual exam.  Current complaints: pap  Menstrual concerns? No  depo  Breast or nipple changes? No  Contraception use? Yes depo Sexually active? Yes female partne  No LMP recorded. Patient has had an injection.   The pregnancy intention screening data noted above was reviewed. Potential methods of contraception were discussed. The patient elected to proceed with No data recorded.   Last pap     Component Value Date/Time   DIAGPAP  07/30/2019 1429    - Negative for intraepithelial lesion or malignancy (NILM)   DIAGPAP  08/24/2016 0000    NEGATIVE FOR INTRAEPITHELIAL LESIONS OR MALIGNANCY.   DIAGPAP SHIFT IN FLORA SUGGESTIVE OF BACTERIAL VAGINOSIS. 08/24/2016 0000   HPVHIGH Negative 07/30/2019 1429   ADEQPAP  07/30/2019 1429    Satisfactory for evaluation; transformation zone component PRESENT.   ADEQPAP  08/24/2016 0000    Satisfactory for evaluation  endocervical/transformation zone component ABSENT.    Last mammogram: none on file.  Last colonoscopy: n/a.      12/13/2022    3:24 PM 04/09/2021    2:42 PM 09/02/2020   10:26 AM 09/17/2019    3:17 PM 09/15/2018    2:01 PM  Depression screen PHQ 2/9  Decreased Interest 2 1 1 2    Down, Depressed, Hopeless 2 2 1 2 1   PHQ - 2 Score 4 3 2 4 1   Altered sleeping 1 2 1 3    Tired, decreased energy 2 2 1 3    Change in appetite 0 0 0 0   Feeling bad or failure about yourself  2 1 1 2    Trouble concentrating 0 0 1 0   Moving slowly or fidgety/restless 0 0 0 0   Suicidal thoughts 0 0 1 2   PHQ-9 Score 9 8 7 14    Difficult doing work/chores Not difficult at all   Somewhat difficult         04/09/2021    2:43 PM  GAD 7 : Generalized Anxiety Score  Nervous, Anxious, on Edge 0  Control/stop worrying 0  Worry too  much - different things 1  Trouble relaxing 1  Restless 1  Easily annoyed or irritable 0  Afraid - awful might happen 0  Total GAD 7 Score 3  Anxiety Difficulty Not difficult at all     Review of Systems:   Pertinent items are noted in HPI Denies any headaches, blurred vision, fatigue, shortness of breath, chest pain, abdominal pain, abnormal vaginal discharge/itching/odor/irritation, problems with periods, bowel movements, urination, or intercourse unless otherwise stated above. Pertinent History Reviewed:  Reviewed past medical,surgical, social and family history.  Reviewed problem list, medications and allergies. Physical Assessment:   Vitals:   06/06/23 1519  BP: (!) 144/89  Pulse: (!) 102  Weight: 232 lb 8 oz (105.5 kg)  Body mass index is 36.41 kg/m.        Physical Examination:   General appearance - well appearing, and in no distress  Mental status - alert, oriented to person, place, and time  Psych:  She has a normal mood and affect  Skin - warm and dry, normal color, no suspicious lesions noted  Chest - effort normal, all lung fields clear to auscultation bilaterally  Heart - normal rate and regular  rhythm  Breasts - breasts appear normal, no suspicious masses, no skin or nipple changes or  axillary nodes  Abdomen - soft, nontender, nondistended, no masses or organomegaly  Pelvic -  VULVA: normal appearing vulva with no masses, tenderness or lesions   VAGINA: normal appearing vagina with normal color and discharge, no lesions   CERVIX: normal appearing cervix without discharge or lesions, no CMT  Thin prep pap is done with HR HPV cotesting  UTERUS: uterus is felt to be normal size, shape, consistency and nontender   ADNEXA: No adnexal masses or tenderness noted.  Extremities:  No swelling or varicosities noted  Chaperone present for exam  No results found for this or any previous visit (from the past 24 hours).    Assessment & Plan:  1. Pap smear for  cervical cancer screening (Primary) Pap collected - Cytology - PAP  2. Well woman exam with routine gynecological exam - Cervical cancer screening: Discussed guidelines. Pap with HPV collected - STD Testing: declines - Birth Control: Discussed options and their risks, benefits and common side effects; discussed VTE with estrogen containing options. Desires: Depo - Breast Health: Encouraged self breast awareness/SBE. Teaching provided. Discussed limits of clinical breast exam for detecting breast cancer. Rx given for MXR - F/U 12 months and prn       No orders of the defined types were placed in this encounter.   Meds: No orders of the defined types were placed in this encounter.   Follow-up: No follow-ups on file.  Lorriane Shire, MD 06/06/2023 3:22 PM

## 2023-06-10 LAB — CYTOLOGY - PAP
Adequacy: ABSENT
Chlamydia: NEGATIVE
Comment: NEGATIVE
Comment: NEGATIVE
Comment: NEGATIVE
Comment: NORMAL
Diagnosis: NEGATIVE
High risk HPV: NEGATIVE
Neisseria Gonorrhea: NEGATIVE
Trichomonas: NEGATIVE

## 2023-06-13 ENCOUNTER — Encounter: Payer: Self-pay | Admitting: Obstetrics and Gynecology

## 2023-06-20 ENCOUNTER — Ambulatory Visit: Payer: Medicaid Other

## 2023-07-18 ENCOUNTER — Ambulatory Visit: Payer: Self-pay

## 2023-08-10 ENCOUNTER — Encounter: Payer: Self-pay | Admitting: *Deleted

## 2023-08-10 ENCOUNTER — Ambulatory Visit
Admission: EM | Admit: 2023-08-10 | Discharge: 2023-08-10 | Disposition: A | Discharge status: Home / Self Care | Payer: Self-pay | Attending: Nurse Practitioner | Admitting: Nurse Practitioner

## 2023-08-10 ENCOUNTER — Other Ambulatory Visit: Payer: Self-pay

## 2023-08-10 DIAGNOSIS — S161XXA Strain of muscle, fascia and tendon at neck level, initial encounter: Secondary | ICD-10-CM

## 2023-08-10 DIAGNOSIS — S29012A Strain of muscle and tendon of back wall of thorax, initial encounter: Secondary | ICD-10-CM

## 2023-08-10 DIAGNOSIS — S39012A Strain of muscle, fascia and tendon of lower back, initial encounter: Secondary | ICD-10-CM

## 2023-08-10 MED ORDER — NAPROXEN 500 MG PO TABS: 500.0000 mg | ORAL_TABLET | Freq: Two times a day (BID) | ORAL | 0 refills | Status: AC

## 2023-08-10 MED ORDER — METHOCARBAMOL 500 MG PO TABS: 500.0000 mg | ORAL_TABLET | Freq: Every morning | ORAL | 0 refills | Status: AC

## 2023-08-10 MED ORDER — CYCLOBENZAPRINE HCL 10 MG PO TABS: 10.0000 mg | ORAL_TABLET | Freq: Every day | ORAL | 0 refills | Status: AC
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
## 2023-09-07 ENCOUNTER — Other Ambulatory Visit: Payer: Self-pay | Admitting: Family Medicine

## 2023-11-25 ENCOUNTER — Other Ambulatory Visit: Payer: Self-pay

## 2023-11-25 MED ORDER — METOPROLOL SUCCINATE ER 25 MG PO TB24: 25.0000 mg | ORAL_TABLET | Freq: Every day | ORAL | 0 refills | Status: DC

## 2023-12-27 ENCOUNTER — Other Ambulatory Visit: Payer: Self-pay

## 2023-12-27 MED ORDER — METOPROLOL SUCCINATE ER 25 MG PO TB24: 25.0000 mg | ORAL_TABLET | Freq: Every day | ORAL | 0 refills | Status: DC

## 2023-12-27 NOTE — Telephone Encounter (Signed)
 Please call patient and schedule office visit for BP with any physician.  Suzann Daring, MD  Family Medicine Teaching Service

## 2023-12-28 ENCOUNTER — Telehealth: Payer: Self-pay

## 2023-12-28 NOTE — Telephone Encounter (Signed)
 Called patient and appointment has been scheduled for 8/25 with Dr. Zheng. Cassell Mary CMA

## 2024-01-02 ENCOUNTER — Ambulatory Visit: Payer: Self-pay | Admitting: Family Medicine

## 2024-01-02 ENCOUNTER — Encounter: Payer: Self-pay | Admitting: Family Medicine

## 2024-01-02 VITALS — BP 149/89 | HR 103 | Ht 67.0 in | Wt 237.4 lb

## 2024-01-02 DIAGNOSIS — I10 Essential (primary) hypertension: Secondary | ICD-10-CM

## 2024-01-02 DIAGNOSIS — E119 Type 2 diabetes mellitus without complications: Secondary | ICD-10-CM

## 2024-01-02 LAB — POCT GLYCOSYLATED HEMOGLOBIN (HGB A1C): HbA1c, POC (controlled diabetic range): 6.6 % (ref 0.0–7.0)

## 2024-01-02 MED ORDER — LOSARTAN POTASSIUM 25 MG PO TABS
25.0000 mg | ORAL_TABLET | Freq: Every day | ORAL | 3 refills | Status: AC
Start: 2024-01-02 — End: ?

## 2024-01-02 MED ORDER — METFORMIN HCL ER 500 MG PO TB24: 500.0000 mg | ORAL_TABLET | Freq: Every day | ORAL | 3 refills | Status: AC

## 2024-01-02 NOTE — Patient Instructions (Signed)
 Good to see you today - Thank you for coming in  Things we discussed today:  1) For your blood pressure, - I am switching your metoprolol  to losartan . Losartan  can help control your diabetes while also protecting your kidneys  2) For your diabetes,  - Take metformin  500mg  daily   Come back to see me in 2 weeks. We will recheck your blood pressure and see if you need any adjustments to your meds.

## 2024-01-02 NOTE — Progress Notes (Unsigned)
    SUBJECTIVE:   CHIEF COMPLAINT / HPI:   Tonya Owens is a 43yo F that pf HTN f/u.    HTN - reports that she is out metoprolol  and has not been taking it.   T2DM - was having GI upset with metformin , she is taking it but not consistently every day. - She is also using another supplement to help with her diabetes called burburine - She sometimes will take the berberine supplement instead of her metformin  - She does feel like her GI upset and diarrhea from metformin  has been getting better with prolonged use.    PERTINENT  PMH / PSH: HTN, T2DM, obesity  OBJECTIVE:   BP (!) 149/89   Pulse (!) 103   Ht 5' 7 (1.702 m)   Wt 237 lb 6.4 oz (107.7 kg)   SpO2 100%   BMI 37.18 kg/m   General: Alert, pleasant woman. NAD. HEENT: NCAT. MMM. CV: RRR, no murmurs.  Resp: CTAB, no wheezing or crackles. Normal WOB on RA.  Abm: Soft, nontender, nondistended. BS present. Ext: Moves all ext spontaneously Skin: Warm, well perfused   ASSESSMENT/PLAN:   Assessment & Plan Essential hypertension, benign Above goal, currently not on medication (ran out of metoprolol ). From chart review, PCP plan was to switch to ARB for added renal benefit and concurrent T2DM. - Start losartan  25mg  daily - BMP today - f/u in 2 weeks Type 2 diabetes mellitus without complication, without long-term current use of insulin (HCC) A1c 6.6, at goal. Counseled that there is limited evidence on efficacy or safety of berberine supplements, however it is ok if pt would prefer to continue taking. Encouraged to take metformin  more consistently if tolerated from GI SE's.  - Metformin  500XR daily - UACR     Tonya Nearing, MD [facility]

## 2024-01-03 LAB — BASIC METABOLIC PANEL WITH GFR
BUN/Creatinine Ratio: 9 (ref 9–23)
BUN: 7 mg/dL (ref 6–24)
CO2: 19 mmol/L — ABNORMAL LOW (ref 20–29)
Calcium: 9.4 mg/dL (ref 8.7–10.2)
Chloride: 103 mmol/L (ref 96–106)
Creatinine, Ser: 0.82 mg/dL (ref 0.57–1.00)
Glucose: 118 mg/dL — ABNORMAL HIGH (ref 70–99)
Potassium: 3.7 mmol/L (ref 3.5–5.2)
Sodium: 139 mmol/L (ref 134–144)
eGFR: 91 mL/min/1.73 (ref >59)

## 2024-01-03 LAB — MICROALBUMIN / CREATININE URINE RATIO
Creatinine, Urine: 77.9 mg/dL
Microalb/Creat Ratio: 8 mg/g{creat} (ref 0–29)
Microalbumin, Urine: 6.3 ug/mL

## 2024-01-04 NOTE — Assessment & Plan Note (Addendum)
 A1c 6.6, at goal. Counseled that there is limited evidence on efficacy or safety of berberine supplements, however it is ok if pt would prefer to continue taking. Encouraged to take metformin  more consistently if tolerated from GI SE's.  - Metformin  500XR daily - UACR

## 2024-01-04 NOTE — Assessment & Plan Note (Signed)
 Above goal, currently not on medication (ran out of metoprolol ). From chart review, PCP plan was to switch to ARB for added renal benefit and concurrent T2DM. - Start losartan  25mg  daily - BMP today - f/u in 2 weeks

## 2024-01-05 ENCOUNTER — Ambulatory Visit: Payer: Self-pay | Admitting: Family Medicine
